# Patient Record
Sex: Female | Born: 1955 | Race: White | Hispanic: No | Marital: Single | Smoking: Never smoker
Health system: Southern US, Community
[De-identification: ages and names within clinical notes are randomized; demographics above are authoritative.]

## PROBLEM LIST (undated history)

## (undated) DIAGNOSIS — M79602 Pain in left arm: Secondary | ICD-10-CM

## (undated) DIAGNOSIS — IMO0002 Reserved for concepts with insufficient information to code with codable children: Secondary | ICD-10-CM

## (undated) DIAGNOSIS — G43009 Migraine without aura, not intractable, without status migrainosus: Secondary | ICD-10-CM

## (undated) DIAGNOSIS — R3129 Other microscopic hematuria: Secondary | ICD-10-CM

## (undated) DIAGNOSIS — E785 Hyperlipidemia, unspecified: Secondary | ICD-10-CM

## (undated) DIAGNOSIS — T4145XA Adverse effect of unspecified anesthetic, initial encounter: Secondary | ICD-10-CM

## (undated) DIAGNOSIS — R42 Dizziness and giddiness: Secondary | ICD-10-CM

## (undated) DIAGNOSIS — R05 Cough: Secondary | ICD-10-CM

## (undated) DIAGNOSIS — R5383 Other fatigue: Secondary | ICD-10-CM

## (undated) DIAGNOSIS — N390 Urinary tract infection, site not specified: Secondary | ICD-10-CM

## (undated) DIAGNOSIS — Z1231 Encounter for screening mammogram for malignant neoplasm of breast: Secondary | ICD-10-CM

## (undated) DIAGNOSIS — D649 Anemia, unspecified: Secondary | ICD-10-CM

## (undated) DIAGNOSIS — Z0000 Encounter for general adult medical examination without abnormal findings: Secondary | ICD-10-CM

## (undated) DIAGNOSIS — F329 Major depressive disorder, single episode, unspecified: Secondary | ICD-10-CM

## (undated) DIAGNOSIS — R059 Cough, unspecified: Secondary | ICD-10-CM

## (undated) DIAGNOSIS — M25562 Pain in left knee: Secondary | ICD-10-CM

## (undated) DIAGNOSIS — Z01419 Encounter for gynecological examination (general) (routine) without abnormal findings: Secondary | ICD-10-CM

## (undated) DIAGNOSIS — A6000 Herpesviral infection of urogenital system, unspecified: Secondary | ICD-10-CM

## (undated) DIAGNOSIS — M25529 Pain in unspecified elbow: Secondary | ICD-10-CM

## (undated) DIAGNOSIS — R202 Paresthesia of skin: Secondary | ICD-10-CM

## (undated) DIAGNOSIS — K589 Irritable bowel syndrome without diarrhea: Secondary | ICD-10-CM

## (undated) DIAGNOSIS — R1084 Generalized abdominal pain: Secondary | ICD-10-CM

## (undated) DIAGNOSIS — R002 Palpitations: Secondary | ICD-10-CM

## (undated) DIAGNOSIS — R079 Chest pain, unspecified: Secondary | ICD-10-CM

## (undated) DIAGNOSIS — R112 Nausea with vomiting, unspecified: Secondary | ICD-10-CM

## (undated) DIAGNOSIS — F32A Depression, unspecified: Secondary | ICD-10-CM

## (undated) DIAGNOSIS — R51 Headache: Secondary | ICD-10-CM

## (undated) DIAGNOSIS — R109 Unspecified abdominal pain: Secondary | ICD-10-CM

## (undated) DIAGNOSIS — I10 Essential (primary) hypertension: Secondary | ICD-10-CM

## (undated) DIAGNOSIS — M1990 Unspecified osteoarthritis, unspecified site: Secondary | ICD-10-CM

## (undated) DIAGNOSIS — Z9889 Other specified postprocedural states: Secondary | ICD-10-CM

## (undated) DIAGNOSIS — K648 Other hemorrhoids: Secondary | ICD-10-CM

## (undated) DIAGNOSIS — J019 Acute sinusitis, unspecified: Secondary | ICD-10-CM

## (undated) DIAGNOSIS — K56609 Unspecified intestinal obstruction, unspecified as to partial versus complete obstruction: Secondary | ICD-10-CM

## (undated) DIAGNOSIS — N3000 Acute cystitis without hematuria: Secondary | ICD-10-CM

## (undated) DIAGNOSIS — K219 Gastro-esophageal reflux disease without esophagitis: Secondary | ICD-10-CM

## (undated) DIAGNOSIS — R5381 Other malaise: Secondary | ICD-10-CM

## (undated) DIAGNOSIS — T8859XA Other complications of anesthesia, initial encounter: Secondary | ICD-10-CM

## (undated) DIAGNOSIS — R011 Cardiac murmur, unspecified: Secondary | ICD-10-CM

## (undated) HISTORY — DX: Cough, unspecified: R05.9

## (undated) HISTORY — DX: Headache: R51

## (undated) HISTORY — DX: Unspecified intestinal obstruction, unspecified as to partial versus complete obstruction: K56.609

## (undated) HISTORY — DX: Encounter for gynecological examination (general) (routine) without abnormal findings: Z01.419

## (undated) HISTORY — DX: Major depressive disorder, single episode, unspecified: F32.9

## (undated) HISTORY — DX: Reserved for concepts with insufficient information to code with codable children: IMO0002

## (undated) HISTORY — DX: Chest pain, unspecified: R07.9

## (undated) HISTORY — DX: Migraine without aura, not intractable, without status migrainosus: G43.009

## (undated) HISTORY — DX: Cough: R05

## (undated) HISTORY — DX: Encounter for general adult medical examination without abnormal findings: Z00.00

## (undated) HISTORY — DX: Irritable bowel syndrome without diarrhea: K58.9

## (undated) HISTORY — DX: Anemia, unspecified: D64.9

## (undated) HISTORY — DX: Hyperlipidemia, unspecified: E78.5

## (undated) HISTORY — DX: Acute cystitis without hematuria: N30.00

## (undated) HISTORY — DX: Gastro-esophageal reflux disease without esophagitis: K21.9

## (undated) HISTORY — DX: Other malaise: R53.83

## (undated) HISTORY — DX: Palpitations: R00.2

## (undated) HISTORY — DX: Depression, unspecified: F32.A

## (undated) HISTORY — DX: Herpesviral infection of urogenital system, unspecified: A60.00

## (undated) HISTORY — DX: Pain in left knee: M25.562

## (undated) HISTORY — DX: Pain in unspecified elbow: M25.529

## (undated) HISTORY — PX: TUBAL LIGATION: SHX77

## (undated) HISTORY — DX: Other malaise: R53.81

## (undated) HISTORY — DX: Pain in left arm: M79.602

## (undated) HISTORY — DX: Paresthesia of skin: R20.2

## (undated) HISTORY — DX: Encounter for screening mammogram for malignant neoplasm of breast: Z12.31

## (undated) HISTORY — DX: Cardiac murmur, unspecified: R01.1

## (undated) HISTORY — DX: Essential (primary) hypertension: I10

## (undated) HISTORY — DX: Dizziness and giddiness: R42

## (undated) HISTORY — DX: Other microscopic hematuria: R31.29

## (undated) HISTORY — DX: Urinary tract infection, site not specified: N39.0

## (undated) HISTORY — DX: Unspecified abdominal pain: R10.9

## (undated) HISTORY — DX: Unspecified osteoarthritis, unspecified site: M19.90

## (undated) HISTORY — DX: Acute sinusitis, unspecified: J01.90

## (undated) HISTORY — PX: ABDOMINAL HYSTERECTOMY: SHX81

## (undated) HISTORY — DX: Other hemorrhoids: K64.8

## (undated) HISTORY — DX: Generalized abdominal pain: R10.84

---

## 1996-07-31 HISTORY — PX: PARTIAL HYSTERECTOMY: SHX80

## 2004-07-31 HISTORY — PX: BILATERAL OOPHORECTOMY: SHX1221

## 2004-07-31 HISTORY — PX: OTHER SURGICAL HISTORY: SHX169

## 2005-06-09 ENCOUNTER — Encounter: Payer: Self-pay | Admitting: Family Medicine

## 2005-07-31 HISTORY — PX: OTHER SURGICAL HISTORY: SHX169

## 2005-08-23 ENCOUNTER — Encounter: Payer: Self-pay | Admitting: Family Medicine

## 2005-11-30 ENCOUNTER — Encounter: Payer: Self-pay | Admitting: Family Medicine

## 2005-12-07 ENCOUNTER — Encounter: Payer: Self-pay | Admitting: Family Medicine

## 2006-08-08 ENCOUNTER — Encounter: Payer: Self-pay | Admitting: Family Medicine

## 2006-09-20 ENCOUNTER — Encounter: Payer: Self-pay | Admitting: Family Medicine

## 2007-05-16 ENCOUNTER — Encounter: Payer: Self-pay | Admitting: Family Medicine

## 2007-07-17 ENCOUNTER — Encounter: Payer: Self-pay | Admitting: Family Medicine

## 2007-08-02 ENCOUNTER — Encounter: Payer: Self-pay | Admitting: Family Medicine

## 2008-02-12 ENCOUNTER — Ambulatory Visit: Payer: Self-pay | Admitting: Family Medicine

## 2008-02-12 DIAGNOSIS — F329 Major depressive disorder, single episode, unspecified: Secondary | ICD-10-CM

## 2008-02-12 DIAGNOSIS — D509 Iron deficiency anemia, unspecified: Secondary | ICD-10-CM

## 2008-02-12 DIAGNOSIS — R011 Cardiac murmur, unspecified: Secondary | ICD-10-CM | POA: Insufficient documentation

## 2008-02-12 DIAGNOSIS — Z8679 Personal history of other diseases of the circulatory system: Secondary | ICD-10-CM | POA: Insufficient documentation

## 2008-02-12 DIAGNOSIS — K219 Gastro-esophageal reflux disease without esophagitis: Secondary | ICD-10-CM

## 2008-02-12 DIAGNOSIS — I10 Essential (primary) hypertension: Secondary | ICD-10-CM | POA: Insufficient documentation

## 2008-02-12 DIAGNOSIS — G43009 Migraine without aura, not intractable, without status migrainosus: Secondary | ICD-10-CM | POA: Insufficient documentation

## 2008-02-12 DIAGNOSIS — M1990 Unspecified osteoarthritis, unspecified site: Secondary | ICD-10-CM

## 2008-02-12 DIAGNOSIS — R109 Unspecified abdominal pain: Secondary | ICD-10-CM

## 2008-02-12 DIAGNOSIS — A6000 Herpesviral infection of urogenital system, unspecified: Secondary | ICD-10-CM | POA: Insufficient documentation

## 2008-02-12 DIAGNOSIS — E785 Hyperlipidemia, unspecified: Secondary | ICD-10-CM | POA: Insufficient documentation

## 2008-02-12 DIAGNOSIS — F331 Major depressive disorder, recurrent, moderate: Secondary | ICD-10-CM | POA: Insufficient documentation

## 2008-02-12 HISTORY — DX: Unspecified abdominal pain: R10.9

## 2008-02-12 HISTORY — DX: Cardiac murmur, unspecified: R01.1

## 2008-02-12 HISTORY — DX: Herpesviral infection of urogenital system, unspecified: A60.00

## 2008-02-12 HISTORY — DX: Essential (primary) hypertension: I10

## 2008-02-12 HISTORY — DX: Iron deficiency anemia, unspecified: D50.9

## 2008-02-12 HISTORY — DX: Gastro-esophageal reflux disease without esophagitis: K21.9

## 2008-02-12 HISTORY — DX: Major depressive disorder, single episode, unspecified: F32.9

## 2008-02-12 HISTORY — DX: Unspecified osteoarthritis, unspecified site: M19.90

## 2008-02-27 ENCOUNTER — Telehealth: Payer: Self-pay | Admitting: Family Medicine

## 2008-03-02 ENCOUNTER — Telehealth: Payer: Self-pay | Admitting: Family Medicine

## 2008-04-13 ENCOUNTER — Ambulatory Visit: Payer: Self-pay | Admitting: Family Medicine

## 2008-05-14 ENCOUNTER — Ambulatory Visit: Payer: Self-pay | Admitting: Family Medicine

## 2008-05-14 DIAGNOSIS — N390 Urinary tract infection, site not specified: Secondary | ICD-10-CM | POA: Insufficient documentation

## 2008-05-14 LAB — CONVERTED CEMR LAB
Specific Gravity, Urine: 1.015
WBC Urine, dipstick: NEGATIVE
pH: 5

## 2008-05-15 ENCOUNTER — Encounter: Payer: Self-pay | Admitting: Family Medicine

## 2008-06-05 ENCOUNTER — Ambulatory Visit: Payer: Self-pay | Admitting: Family Medicine

## 2008-06-10 LAB — CONVERTED CEMR LAB
Alkaline Phosphatase: 61 units/L (ref 39–117)
BUN: 10 mg/dL (ref 6–23)
Bilirubin, Direct: 0.1 mg/dL (ref 0.0–0.3)
Calcium: 8.9 mg/dL (ref 8.4–10.5)
Chloride: 105 meq/L (ref 96–112)
Creatinine, Ser: 0.8 mg/dL (ref 0.4–1.2)
Direct LDL: 162.4 mg/dL
GFR calc non Af Amer: 80 mL/min
HDL: 53.9 mg/dL (ref >39.0)
Potassium: 3.9 meq/L (ref 3.5–5.1)
Sodium: 141 meq/L (ref 135–145)
Total Protein: 6.6 g/dL (ref 6.0–8.3)
VLDL: 38 mg/dL (ref 0–40)

## 2008-06-12 ENCOUNTER — Ambulatory Visit: Payer: Self-pay | Admitting: Family Medicine

## 2008-06-12 LAB — CONVERTED CEMR LAB: LDL Goal: 160 mg/dL

## 2008-06-16 ENCOUNTER — Encounter: Admission: RE | Admit: 2008-06-16 | Discharge: 2008-06-16 | Payer: Self-pay | Admitting: Family Medicine

## 2008-06-16 LAB — HM MAMMOGRAPHY: HM Mammogram: NEGATIVE

## 2008-06-17 ENCOUNTER — Encounter (INDEPENDENT_AMBULATORY_CARE_PROVIDER_SITE_OTHER): Payer: Self-pay | Admitting: *Deleted

## 2008-08-25 ENCOUNTER — Telehealth: Payer: Self-pay | Admitting: Family Medicine

## 2008-09-14 ENCOUNTER — Ambulatory Visit: Payer: Self-pay | Admitting: Family Medicine

## 2008-09-21 LAB — CONVERTED CEMR LAB
ALT: 46 units/L — ABNORMAL HIGH (ref 0–35)
AST: 33 units/L (ref 0–37)
HDL: 51.9 mg/dL (ref >39.0)
Total CHOL/HDL Ratio: 4.6
VLDL: 32 mg/dL (ref 0–40)

## 2008-09-28 ENCOUNTER — Ambulatory Visit: Payer: Self-pay | Admitting: Family Medicine

## 2008-10-02 ENCOUNTER — Ambulatory Visit: Payer: Self-pay | Admitting: Family Medicine

## 2008-10-02 DIAGNOSIS — R05 Cough: Secondary | ICD-10-CM

## 2008-10-02 DIAGNOSIS — R053 Chronic cough: Secondary | ICD-10-CM

## 2008-10-02 HISTORY — DX: Chronic cough: R05.3

## 2008-10-09 ENCOUNTER — Telehealth (INDEPENDENT_AMBULATORY_CARE_PROVIDER_SITE_OTHER): Payer: Self-pay | Admitting: *Deleted

## 2009-01-12 ENCOUNTER — Ambulatory Visit: Payer: Self-pay | Admitting: Family Medicine

## 2009-01-12 DIAGNOSIS — R5381 Other malaise: Secondary | ICD-10-CM | POA: Insufficient documentation

## 2009-01-12 DIAGNOSIS — R5383 Other fatigue: Secondary | ICD-10-CM

## 2009-01-12 HISTORY — DX: Other fatigue: R53.83

## 2009-01-12 LAB — CONVERTED CEMR LAB
Bacteria, UA: 0
Bilirubin Urine: NEGATIVE
Casts: 0 /lpf
Nitrite: NEGATIVE
Protein, U semiquant: NEGATIVE
Specific Gravity, Urine: 1.015
Urobilinogen, UA: 0.2
WBC Urine, dipstick: NEGATIVE
pH: 6

## 2009-01-13 ENCOUNTER — Ambulatory Visit: Payer: Self-pay | Admitting: Family Medicine

## 2009-01-14 ENCOUNTER — Encounter: Admission: RE | Admit: 2009-01-14 | Discharge: 2009-01-14 | Payer: Self-pay | Admitting: Family Medicine

## 2009-01-15 LAB — CONVERTED CEMR LAB
AST: 29 units/L (ref 0–37)
Albumin: 4 g/dL (ref 3.5–5.2)
Alkaline Phosphatase: 62 units/L (ref 39–117)
BUN: 13 mg/dL (ref 6–23)
Basophils Absolute: 0 10*3/uL (ref 0.0–0.1)
CO2: 29 meq/L (ref 19–32)
Calcium: 8.8 mg/dL (ref 8.4–10.5)
Cholesterol: 159 mg/dL (ref 0–200)
Eosinophils Relative: 1.7 % (ref 0.0–5.0)
Ferritin: 3.5 ng/mL — ABNORMAL LOW (ref 10.0–291.0)
GFR calc non Af Amer: 79.72 mL/min (ref >60)
Glucose, Bld: 92 mg/dL (ref 70–99)
HCT: 35.5 % — ABNORMAL LOW (ref 36.0–46.0)
HDL: 66.2 mg/dL (ref >39.00)
Hemoglobin: 11.7 g/dL — ABNORMAL LOW (ref 12.0–15.0)
Lipase: 19 units/L (ref 11.0–59.0)
Lymphocytes Relative: 26.9 % (ref 12.0–46.0)
Lymphs Abs: 1.7 10*3/uL (ref 0.7–4.0)
Monocytes Relative: 7.4 % (ref 3.0–12.0)
Platelets: 194 10*3/uL (ref 150.0–400.0)
RDW: 14.5 % (ref 11.5–14.6)
TSH: 1.35 microintl units/mL (ref 0.35–5.50)
Total Protein: 6.6 g/dL (ref 6.0–8.3)
Triglycerides: 104 mg/dL (ref 0.0–149.0)
VLDL: 20.8 mg/dL (ref 0.0–40.0)
Vit D, 25-Hydroxy: 23 ng/mL — ABNORMAL LOW (ref 30–89)
WBC: 6.4 10*3/uL (ref 4.5–10.5)

## 2009-02-08 ENCOUNTER — Telehealth: Payer: Self-pay | Admitting: Family Medicine

## 2009-02-10 ENCOUNTER — Ambulatory Visit: Payer: Self-pay | Admitting: Gastroenterology

## 2009-02-10 DIAGNOSIS — K589 Irritable bowel syndrome without diarrhea: Secondary | ICD-10-CM | POA: Insufficient documentation

## 2009-02-10 DIAGNOSIS — K648 Other hemorrhoids: Secondary | ICD-10-CM

## 2009-02-10 HISTORY — DX: Other hemorrhoids: K64.8

## 2009-02-10 HISTORY — DX: Irritable bowel syndrome, unspecified: K58.9

## 2009-02-25 ENCOUNTER — Ambulatory Visit: Payer: Self-pay | Admitting: Gastroenterology

## 2009-02-25 LAB — HM COLONOSCOPY

## 2009-03-02 ENCOUNTER — Ambulatory Visit: Payer: Self-pay | Admitting: Family Medicine

## 2009-03-30 ENCOUNTER — Telehealth: Payer: Self-pay | Admitting: Family Medicine

## 2009-05-06 ENCOUNTER — Ambulatory Visit: Payer: Self-pay | Admitting: Family Medicine

## 2009-09-07 ENCOUNTER — Ambulatory Visit: Payer: Self-pay | Admitting: Family Medicine

## 2009-09-07 DIAGNOSIS — R51 Headache: Secondary | ICD-10-CM | POA: Insufficient documentation

## 2009-09-07 DIAGNOSIS — R42 Dizziness and giddiness: Secondary | ICD-10-CM | POA: Insufficient documentation

## 2009-09-07 DIAGNOSIS — R519 Headache, unspecified: Secondary | ICD-10-CM | POA: Insufficient documentation

## 2009-09-13 ENCOUNTER — Telehealth: Payer: Self-pay | Admitting: Family Medicine

## 2009-09-21 ENCOUNTER — Telehealth: Payer: Self-pay | Admitting: Family Medicine

## 2009-09-22 ENCOUNTER — Ambulatory Visit: Payer: Self-pay | Admitting: Family Medicine

## 2009-09-24 LAB — CONVERTED CEMR LAB
BUN: 15 mg/dL (ref 6–23)
Calcium: 8.6 mg/dL (ref 8.4–10.5)
Creatinine, Ser: 0.8 mg/dL (ref 0.4–1.2)
GFR calc non Af Amer: 79.51 mL/min (ref >60)
Glucose, Bld: 119 mg/dL — ABNORMAL HIGH (ref 70–99)

## 2009-09-27 ENCOUNTER — Telehealth: Payer: Self-pay | Admitting: Family Medicine

## 2009-09-28 ENCOUNTER — Ambulatory Visit: Payer: Self-pay | Admitting: Family Medicine

## 2009-09-28 DIAGNOSIS — R002 Palpitations: Secondary | ICD-10-CM

## 2009-09-28 DIAGNOSIS — R209 Unspecified disturbances of skin sensation: Secondary | ICD-10-CM

## 2009-09-28 DIAGNOSIS — M79609 Pain in unspecified limb: Secondary | ICD-10-CM | POA: Insufficient documentation

## 2009-09-28 HISTORY — DX: Palpitations: R00.2

## 2009-09-28 HISTORY — DX: Unspecified disturbances of skin sensation: R20.9

## 2009-09-28 HISTORY — PX: CARDIOVASCULAR STRESS TEST: SHX262

## 2009-10-04 ENCOUNTER — Telehealth: Payer: Self-pay | Admitting: Family Medicine

## 2009-10-06 ENCOUNTER — Telehealth: Payer: Self-pay | Admitting: Family Medicine

## 2009-10-12 ENCOUNTER — Encounter: Payer: Self-pay | Admitting: Family Medicine

## 2009-10-26 ENCOUNTER — Ambulatory Visit: Payer: Self-pay | Admitting: Family Medicine

## 2009-10-26 DIAGNOSIS — R1084 Generalized abdominal pain: Secondary | ICD-10-CM | POA: Insufficient documentation

## 2009-10-26 DIAGNOSIS — R079 Chest pain, unspecified: Secondary | ICD-10-CM | POA: Insufficient documentation

## 2009-11-03 ENCOUNTER — Telehealth (INDEPENDENT_AMBULATORY_CARE_PROVIDER_SITE_OTHER): Payer: Self-pay | Admitting: *Deleted

## 2009-11-04 ENCOUNTER — Ambulatory Visit: Payer: Self-pay

## 2009-11-04 ENCOUNTER — Encounter (HOSPITAL_COMMUNITY): Admission: RE | Admit: 2009-11-04 | Discharge: 2009-12-29 | Payer: Self-pay | Admitting: Family Medicine

## 2009-11-04 ENCOUNTER — Ambulatory Visit: Payer: Self-pay | Admitting: Cardiology

## 2009-11-05 ENCOUNTER — Ambulatory Visit: Payer: Self-pay | Admitting: Internal Medicine

## 2009-11-05 DIAGNOSIS — J019 Acute sinusitis, unspecified: Secondary | ICD-10-CM | POA: Insufficient documentation

## 2009-11-24 ENCOUNTER — Ambulatory Visit: Payer: Self-pay | Admitting: Family Medicine

## 2010-04-15 ENCOUNTER — Ambulatory Visit: Payer: Self-pay | Admitting: Family Medicine

## 2010-04-15 DIAGNOSIS — R3129 Other microscopic hematuria: Secondary | ICD-10-CM

## 2010-04-15 DIAGNOSIS — N3000 Acute cystitis without hematuria: Secondary | ICD-10-CM | POA: Insufficient documentation

## 2010-04-15 HISTORY — DX: Other microscopic hematuria: R31.29

## 2010-04-15 LAB — CONVERTED CEMR LAB
Glucose, Urine, Semiquant: NEGATIVE
Nitrite: POSITIVE
Specific Gravity, Urine: 1.025
pH: 6.5

## 2010-04-19 ENCOUNTER — Ambulatory Visit: Payer: Self-pay | Admitting: Family Medicine

## 2010-04-19 DIAGNOSIS — M25569 Pain in unspecified knee: Secondary | ICD-10-CM | POA: Insufficient documentation

## 2010-04-19 DIAGNOSIS — M25529 Pain in unspecified elbow: Secondary | ICD-10-CM | POA: Insufficient documentation

## 2010-04-19 DIAGNOSIS — IMO0002 Reserved for concepts with insufficient information to code with codable children: Secondary | ICD-10-CM | POA: Insufficient documentation

## 2010-04-21 ENCOUNTER — Ambulatory Visit: Payer: Self-pay | Admitting: Cardiology

## 2010-04-28 ENCOUNTER — Encounter: Payer: Self-pay | Admitting: Family Medicine

## 2010-04-29 ENCOUNTER — Ambulatory Visit: Payer: Self-pay | Admitting: Family Medicine

## 2010-04-29 LAB — CONVERTED CEMR LAB
Ketones, urine, test strip: NEGATIVE
Nitrite: NEGATIVE
RBC / HPF: 0
Specific Gravity, Urine: >=1.03
Urobilinogen, UA: 0.2

## 2010-07-31 DIAGNOSIS — R4189 Other symptoms and signs involving cognitive functions and awareness: Secondary | ICD-10-CM | POA: Insufficient documentation

## 2010-07-31 HISTORY — DX: Other symptoms and signs involving cognitive functions and awareness: R41.89

## 2010-08-21 ENCOUNTER — Encounter: Payer: Self-pay | Admitting: Family Medicine

## 2010-08-30 NOTE — Assessment & Plan Note (Signed)
Summary: URINALYSIS/DLO  Nurse Visit   Allergies: 1)  ! Codeine Laboratory Results   Urine Tests  Date/Time Received: April 29, 2010 4:40 PM   Routine Urinalysis   Color: yellow Appearance: Clear Glucose: negative   (Normal Range: Negative) Bilirubin: negative   (Normal Range: Negative) Ketone: negative   (Normal Range: Negative) Spec. Gravity: >=1.030   (Normal Range: 1.003-1.035) Blood: large   (Normal Range: Negative) pH: 5.0   (Normal Range: 5.0-8.0) Protein: trace   (Normal Range: Negative) Urobilinogen: 0.2   (Normal Range: 0-1) Nitrite: negative   (Normal Range: Negative) Leukocyte Esterace: small   (Normal Range: Negative)  Urine Microscopic WBC/HPF: 5-6  RBC/HPF: 0 Epithelial/HPF: none    Comments: [facility] uses [name].  Please call her at [phone].  Will send in for culture... wbc present ...no blood seen on microscopic, but some seen on dip. Kerby Nora MD  April 29, 2010 5:01 PM     Orders Added: 1)  UA Dipstick W/ Micro (manual) [81000] 2)  T-Culture, Urine [[id number]]  Appended Document: URINALYSIS/DLO   Appended Document: URINALYSIS/DLO Patient advised via message on patient personal cell phone.Consuello Masse CMA

## 2010-08-30 NOTE — Assessment & Plan Note (Signed)
Summary: ? UTI   Vital Signs:  Patient profile:   55 year old female Height:      68 inches Weight:      237.0 pounds BMI:     36.17 Temp:     98.6 degrees F oral Pulse rate:   84 / minute Pulse rhythm:   regular BP sitting:   116 / 78  (left arm) Cuff size:   large  Vitals Entered By: Benny Lennert CMA Duncan Dull) (April 15, 2010 10:34 AM)  History of Present Illness: Chief complaint ? Uti  Acute Visit History:      The patient complains of genitourinary symptoms.  These symptoms began one day ago.  She notes a history of dysuria, frequency, and urgency.  She denies abdominal cramping, abdominal pain, fever, flank pain, hematuria, or nausea.  Comments: odor in urine.        Problems Prior to Update: 1)  Abdominal Pain, Generalized  (ICD-789.07) 2)  Sinusitis- Acute-nos  (ICD-461.9) 3)  Chest Pain, Acute  (ICD-786.50) 4)  Paresthesia  (ICD-782.0) 5)  Arm Pain, Left  (ICD-729.5) 6)  Palpitations, Chronic  (ICD-785.1) 7)  Headache  (ICD-784.0) 8)  Dizziness  (ICD-780.4) 9)  Internal Hemorrhoids Without Mention Comp  (ICD-455.0) 10)  Ibs  (ICD-564.1) 11)  Fatigue, Chronic  (ICD-780.79) 12)  Cough  (ICD-786.2) 13)  Routine Gynecological Examination  (ICD-V72.31) 14)  Well Woman  (ICD-V70.0) 15)  Other Screening Mammogram  (ICD-V76.12) 16)  Uti  (ICD-599.0) 17)  Abdominal Pain, Chronic  (ICD-789.00) 18)  Common Migraine  (ICD-346.10) 19)  Heart Murmur, Hx of  (ICD-V12.50) 20)  Genital Herpes  (ICD-054.10) 21)  Hypertension  (ICD-401.9) 22)  Hyperlipidemia  (ICD-272.4) 23)  Gerd  (ICD-530.81) 24)  Depression  (ICD-311) 25)  Osteoarthritis  (ICD-715.90) 26)  Anemia-iron Deficiency  (ICD-280.9)  Current Medications (verified): 1)  Valtrex 500 Mg  Tabs (Valacyclovir Hcl) .... Take 1 Tablet By Mouth Once A Day 2)  Calcium Citrate-Vitamin D 315-200 Mg-Unit  Tabs (Calcium Citrate-Vitamin D) .... Take 1 Tablet By Mouth Once A Day 3)  Vitamin B Complex-C   Caps (B Complex-C)  .... Take 1 Capsule By Mouth Once A Day 4)  Simvastatin 40 Mg Tabs (Simvastatin) .Marland Kitchen.. 1 Tab By Mouth Daily 5)  Hydrochlorothiazide 25 Mg Tabs (Hydrochlorothiazide) .Marland Kitchen.. 1 Tab By Mouth Daily 6)  Omeprazole 40 Mg Cpdr (Omeprazole) .... Take 1 Tablet By Mouth Once A Day 7)  Fluticasone Propionate 50 Mcg/act Susp (Fluticasone Propionate) .... 2 Sprays Per Nostril Daily 8)  Ferrous Sulfate 325 (65 Fe) Mg Tabs (Ferrous Sulfate) .... Once Tablet Daily 9)  Glucosamine-Chondroitin 1500-1200 Mg/65ml Liqd (Glucosamine-Chondroitin) .... As Directed On Box  Allergies: 1)  ! Codeine  Past History:  Past medical, surgical, family and social histories (including risk factors) reviewed, and no changes noted (except as noted below).  Past Medical History: Reviewed history from 11/05/2009 and no changes required. Anemia-iron deficiency Osteoarthritis Depression GERD Hyperlipidemia Hypertension Small Bowel Obstruction Urinary Tract Infection Stress test 09/2009 low risk  Past Surgical History: Reviewed history from 02/12/2008 and no changes required. hysterectomy, partial, vaginal 1998 2006 bilateral oopherectomy  for large cyst Barium swallow 2006: hiatal hernia 2007 barium follow thru 2007 because rectum twisted and could't do colonoscopy, was painful though  Family History: Reviewed history from 02/10/2009 and no changes required. father: MI late 53s, CABG, lung cancer 8 uncles with MI mother: breast cancer Family History of Colon Polyps: Sister Family History of Liver Disease/Cirrhosis: Youth worker  Social History: Reviewed history from 02/10/2009 and no changes required. Occupation: Engineering geologist, Editor, commissioning [employer] Divorced 3 children healthy Never Smoked Alcohol use-yes Drug use-yes, remote but no injected Regular exercise-no Diet: fruits, veggies, water Daily Caffeine Use 1 cup  Review of Systems General:  Complains of fatigue; denies fever. CV:  Denies chest  pain or discomfort. Resp:  Denies shortness of breath.  Physical Exam  General:  alert and overweight-appearing, more interactive, more responsive affect than in past few appts.  Mouth:  MMM Lungs:  Normal respiratory effort, chest expands symmetrically. Lungs are clear to auscultation, no crackles or wheezes. Heart:  Normal rate and regular rhythm. S1 and S2 normal without gallop, murmur, click, rub or other extra sounds. Abdomen:  Bowel sounds positive,abdomen soft and non-tender without masses, organomegaly or hernias noted. No CVA tenderness   Impression & Recommendations:  Problem # 1:  CYSTITIS, ACUTE (ICD-595.0)  Orders: UA Dipstick w/o Micro (manual) (16109)  Her updated medication list for this problem includes:    Ciprofloxacin Hcl 500 Mg Tabs (Ciprofloxacin hcl) .Marland Kitchen... 1 tab by mouth two times a day x 3  Encouraged to push clear liquids, get enough rest, and take acetaminophen as needed. Call if no improvement, sooner if worse.  Problem # 2:  MICROSCOPIC HEMATURIA (ICD-599.72) Return for UA in 2weeks to assure resolution.  Her updated medication list for this problem includes:    Ciprofloxacin Hcl 500 Mg Tabs (Ciprofloxacin hcl) .Marland Kitchen... 1 tab by mouth two times a day x 3  Complete Medication List: 1)  Valtrex 500 Mg Tabs (Valacyclovir hcl) .... Take 1 tablet by mouth once a day 2)  Calcium Citrate-vitamin D 315-200 Mg-unit Tabs (Calcium citrate-vitamin d) .... Take 1 tablet by mouth once a day 3)  Vitamin B Complex-c Caps (B complex-c) .... Take 1 capsule by mouth once a day 4)  Simvastatin 40 Mg Tabs (Simvastatin) .Marland Kitchen.. 1 tab by mouth daily 5)  Hydrochlorothiazide 25 Mg Tabs (Hydrochlorothiazide) .Marland Kitchen.. 1 tab by mouth daily 6)  Omeprazole 40 Mg Cpdr (Omeprazole) .... Take 1 tablet by mouth once a day 7)  Fluticasone Propionate 50 Mcg/act Susp (Fluticasone propionate) .... 2 sprays per nostril daily 8)  Ferrous Sulfate 325 (65 Fe) Mg Tabs (Ferrous sulfate) .... Once tablet  daily 9)  Glucosamine-chondroitin 1500-1200 Mg/23ml Liqd (Glucosamine-chondroitin) .... As directed on box 10)  Ciprofloxacin Hcl 500 Mg Tabs (Ciprofloxacin hcl) .Marland Kitchen.. 1 tab by mouth two times a day x 3  Patient Instructions: 1)  Return for RN visit in 2 weeks for Urinalysis to eval hematuria. 2)  Start antibiotics. 3)   Call if symptoms not improving.  Prescriptions: CIPROFLOXACIN HCL 500 MG TABS (CIPROFLOXACIN HCL) 1 tab by mouth two times a day x 3  #6 x 0   Entered and Authorized by:   Kerby Nora MD   Signed by:   Kerby Nora MD on 04/15/2010   Method used:   [facility] to        [name] [id number]retail[facility]       [address]       [place], [place]  [address]       Ph: [phone] or [phone]       Fax: [phone]   RxID:   [id number]   Current Allergies (reviewed today): ! CODEINE  Laboratory Results   Urine Tests  Date/Time Received: April 15, 2010 10:44 AM  Date/Time Reported: April 15, 2010 10:44 AM   Routine Urinalysis  Color: yellow Appearance: Hazy Glucose: negative   (Normal Range: Negative) Bilirubin: negative   (Normal Range: Negative) Ketone: negative   (Normal Range: Negative) Spec. Gravity: 1.025   (Normal Range: 1.003-1.035) Blood: large   (Normal Range: Negative) pH: 6.5   (Normal Range: 5.0-8.0) Protein: negative   (Normal Range: Negative) Urobilinogen: 0.2   (Normal Range: 0-1) Nitrite: positive   (Normal Range: Negative) Leukocyte Esterace: moderate   (Normal Range: Negative)       Last Flu Vaccine:  Fluvax 3+ (05/06/2009 11:39:05 AM) Flu Vaccine Next Due:  Refused

## 2010-08-30 NOTE — Progress Notes (Signed)
  Phone Note From Other Clinic   Caller: Referral Coordinator Summary of Call: [facility] called to say that the order to do just a nerve conduction cannott be done because this patients insurance [employer] will only pay to do a Nerve Conduction with EMG. Patient has been scheduled for 10/12/2009 at 9:00am for both studies. Initial call taken by: Carlton Adam,  October 06, 2009 8:43 AM

## 2010-08-30 NOTE — Letter (Signed)
Summary: [facility]   Imported By: Maryln Gottron 05/18/2010 15:57:36  _____________________________________________________________________  External Attachment:    Type:   Image     Comment:   External Document

## 2010-08-30 NOTE — Assessment & Plan Note (Signed)
Summary: ST DIZZY BLURRED VISION/MK   Vital Signs:  Patient profile:   55 year old female Height:      68.5 inches Weight:      239.13 pounds BMI:     35.96 Temp:     98.1 degrees F oral Pulse rate:   84 / minute Pulse rhythm:   regular BP sitting:   152 / 100  (left arm) Cuff size:   large  Vitals Entered By: Delilah Shan CMA Duncan Dull) (September 07, 2009 4:04 PM)   History of Present Illness: BP elevated.  Patient has been taking [employer] brand of Severe Cough and Congestion two times a day .  Lugene Fuquay CMA (AAMA)  September 07, 2009 4:06 PM    Weight gain in last few months. 10 lb weight gain..not sure why. Limits salt in diet. Has not been checking at home.  Dizzy spells intermittant in last few months. Headhaches, posterior head.   Using OTC decongestant.    Acute Visit History:      The patient complains of cough, earache, fever, nasal discharge, and sore throat.  These symptoms began 1 week ago.  Other comments include: body aches worsening symtoms. Marland Kitchen        Her highest temperature has been 101.        The patient notes wheezing.  The character of the cough is described as productive, green.        The earache is located on the right side.         Hypertension History:      BP had been diet and lifestyule controlled until recently..in past had been on atenolol, but not im last few years. .        Positive major cardiovascular risk factors include hyperlipidemia and hypertension.  Negative major cardiovascular risk factors include female age less than 35 years old and non-tobacco-user status.         Problems Prior to Update: 1)  Internal Hemorrhoids Without Mention Comp  (ICD-455.0) 2)  Ibs  (ICD-564.1) 3)  Fatigue, Chronic  (ICD-780.79) 4)  Cough  (ICD-786.2) 5)  Routine Gynecological Examination  (ICD-V72.31) 6)  Well Woman  (ICD-V70.0) 7)  Other Screening Mammogram  (ICD-V76.12) 8)  Uti  (ICD-599.0) 9)  Abdominal Pain, Chronic  (ICD-789.00) 10)   Common Migraine  (ICD-346.10) 11)  Heart Murmur, Hx of  (ICD-V12.50) 12)  Genital Herpes  (ICD-054.10) 13)  Hypertension  (ICD-401.9) 14)  Hyperlipidemia  (ICD-272.4) 15)  Gerd  (ICD-530.81) 16)  Depression  (ICD-311) 17)  Osteoarthritis  (ICD-715.90) 18)  Anemia-iron Deficiency  (ICD-280.9)  Current Medications (verified): 1)  Omeprazole 20 Mg  Cpdr (Omeprazole) .... Take 1 Tablet By Mouth Once A Day 2)  Valtrex 500 Mg  Tabs (Valacyclovir Hcl) .... Take 1 Tablet By Mouth Once A Day 3)  Calcium Citrate-Vitamin D 315-200 Mg-Unit  Tabs (Calcium Citrate-Vitamin D) .... Take 1 Tablet By Mouth Once A Day 4)  Vitamin B Complex-C   Caps (B Complex-C) .... Take 1 Capsule By Mouth Once A Day 5)  Simvastatin 40 Mg Tabs (Simvastatin) .Marland Kitchen.. 1 Tab By Mouth Daily 6)  Proair Hfa 108 (90 Base) Mcg/act Aers (Albuterol Sulfate) .... 2 Spray Inh Every 4-6 Hours As Needed Wheeze, Coughing Fits 7)  Vitamin D (Ergocalciferol) 50000 Unit Caps (Ergocalciferol) .Marland Kitchen.. 1 Tab By Mouth Weekly X 6 Weeks 8)  Potassium Gluconate 595 Mg Tabs (Potassium Gluconate) .... Take 1 Tablet By Mouth Once A Day 9)  Azithromycin 250 Mg Tabs (Azithromycin) .... 2 Tab By Mouth X 1 Then 1 Tab Po Daily  Allergies: 1)  ! Codeine  Past History:  Past medical, surgical, family and social histories (including risk factors) reviewed, and no changes noted (except as noted below).  Past Medical History: Reviewed history from 02/10/2009 and no changes required. Anemia-iron deficiency Osteoarthritis Depression GERD Hyperlipidemia Hypertension Small Bowel Obstruction Urinary Tract Infection  Past Surgical History: Reviewed history from 02/12/2008 and no changes required. hysterectomy, partial, vaginal 1998 2006 bilateral oopherectomy  for large cyst Barium swallow 2006: hiatal hernia 2007 barium follow thru 2007 because rectum twisted and could't do colonoscopy, was painful though  Family History: Reviewed history from  02/10/2009 and no changes required. father: MI late 38s, CABG, lung cancer 8 uncles with MI mother: breast cancer Family History of Colon Polyps: Sister Family History of Liver Disease/Cirrhosis: Mat Aunt  Social History: Reviewed history from 02/10/2009 and no changes required. 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 CV:  Denies chest pain or discomfort. Resp:  Denies shortness of breath. GI:  Denies abdominal pain. GU:  Denies dysuria.  Physical Exam  General:  obese appearing  IN NAD Head:  no maxilalry sinus ttp Eyes:  No corneal or conjunctival inflammation noted. EOMI. Perrla. Funduscopic exam benign, without hemorrhages, exudates or papilledema. Vision grossly normal. Ears:  External ear exam shows no significant lesions or deformities.  Otoscopic examination reveals clear canals, tympanic membranes are intact bilaterally without bulging, retraction, inflammation or discharge. Hearing is grossly normal bilaterally. Nose:  External nasal examination shows no deformity or inflammation. Nasal mucosa are pink and moist without lesions or exudates. Mouth:  MMM Neck:  no carotid bruit or thyromegaly no cervical or supraclavicular lymphadenopathy  Lungs:  Normal respiratory effort, chest expands symmetrically. Lungs are clear to auscultation, no crackles or wheezes. Heart:  Normal rate and regular rhythm. S1 and S2 normal without gallop, murmur, click, rub or other extra sounds. Pulses:  R and L posterior tibial pulses are full and equal bilaterally  Extremities:  no edema Neurologic:  No cranial nerve deficits noted. Station and gait are normal. Plantar reflexes are down-going bilaterally. DTRs are symmetrical throughout. Sensory, motor  and coordinative functions appear intact.   Impression & Recommendations:  Problem # 1:  DIZZINESS (ICD-780.4) BPs elevated..follow BP at home. Stop decongestant. If BPs remain elevated will need to treat.  ? due to nasal congestion and eustacian tube dysfunction...try mucinex two times a day.   Problem # 2:  HEADACHE (ICD-784.0) Nml neuro exam... ? due to HTN as above.  Problem # 3:  HYPERTENSION (ICD-401.9) ? inadequate control.  Encouraged exercise, weight loss, healthy eating habits.   BP today: 152/100 Prior BP: 128/82 (02/10/2009)  Prior 10 Yr Risk Heart Disease: 6 % (06/12/2008)  Labs Reviewed: K+: 4.0 (01/13/2009) Creat: : 0.8 (01/13/2009)   Chol: 159 (01/13/2009)   HDL: 66.20 (01/13/2009)   LDL: 72 (01/13/2009)   TG: 104.0 (01/13/2009)  Problem # 4:  FATIGUE, CHRONIC (ICD-780.79) Minimal improvement.   Complete Medication List: 1)  Omeprazole 20 Mg Cpdr (Omeprazole) .... Take 1 tablet by mouth once a day 2)  Valtrex 500 Mg Tabs (Valacyclovir hcl) .... Take 1 tablet by mouth once a day 3)  Calcium Citrate-vitamin D 315-200 Mg-unit  Tabs (Calcium citrate-vitamin d) .... Take 1 tablet by mouth once a day 4)  Vitamin B Complex-c Caps (B complex-c) .... Take 1 capsule by mouth once a day 5)  Simvastatin 40 Mg Tabs (Simvastatin) .Marland Kitchen.. 1 tab by mouth daily 6)  Proair Hfa 108 (90 Base) Mcg/act Aers (Albuterol sulfate) .... 2 spray inh every 4-6 hours as needed wheeze, coughing fits 7)  Vitamin D (ergocalciferol) 50000 Unit Caps (Ergocalciferol) .Marland Kitchen.. 1 tab by mouth weekly x 6 weeks 8)  Potassium Gluconate 595 Mg Tabs (Potassium gluconate) .... Take 1 tablet by mouth once a day 9)  Azithromycin 250 Mg Tabs (Azithromycin) .... 2 tab by mouth x 1 then 1 tab po daily  Hypertension Assessment/Plan:      The patient's hypertensive risk group is category B: At least one risk factor (excluding diabetes) with no target organ damage.  Her calculated 10 year risk of coronary heart disease  is 6 %.  Today's blood pressure is 152/100.  Her blood pressure goal is < 140/90.  Patient Instructions: 1)  Stop any decongestant.  2)  Mucinex DM during the day. 3)  Nasal saline 3 times daily. 4)  Follow up in 2 weeks 30 min OV. HTN and multiple issue eval. 5)  Follow BP at home in next few days..call if >140/90 off decongestant. Call on friday with measurements.  Prescriptions: AZITHROMYCIN 250 MG TABS (AZITHROMYCIN) 2 tab by mouth x 1 then 1 tab po daily  #6 x 0   Entered and Authorized by:   Kerby Nora MD   Signed by:   Kerby Nora MD on 09/07/2009   Method used:   [facility] to        [name] (retail)       [address]       [place], [place]  [address]       Ph: [phone] or [phone]       Fax: [phone]   RxID:   [id number]   Current Allergies (reviewed today): ! CODEINE

## 2010-08-30 NOTE — Assessment & Plan Note (Signed)
Summary: ROA 1 MTH-30 MIN..CYD   Vital Signs:  Patient profile:   55 year old female Height:      68 inches Weight:      234.2 pounds BMI:     35.74 Temp:     98.3 degrees F oral Pulse rate:   90 / minute Pulse rhythm:   regular BP sitting:   120 / 82  (left arm) Cuff size:   large  Vitals Entered By: Benny Lennert CMA Duncan Dull) (November 24, 2009 3:45 PM)  History of Present Illness: Chief complaint 1 month follow up  Chest pain improved...never started nexium.Marland Kitchenis  on 40 mg daily of omeprazole..rare indigestion.   Seen at Urgent care for sinus infection, bronchitis.. 4/8.Marland Kitchengiven Zpack Never took prednisone for mild wheeze.  Ears fulll..but resolved facial pain pressure. No fever.  Still with nagging dry cough. No shortness of breath.  Occ dizzyness. Continue intermittant posterior headache, occuring . Feels like back of head moving. No balance issues..but did fall unsure why last week. No new weakness in legs, NO numbness.  Walking, but not exercising much.    Problems Prior to Update: 1)  Abdominal Pain, Generalized  (ICD-789.07) 2)  Sinusitis- Acute-nos  (ICD-461.9) 3)  Chest Pain, Acute  (ICD-786.50) 4)  Paresthesia  (ICD-782.0) 5)  Arm Pain, Left  (ICD-729.5) 6)  Palpitations, Chronic  (ICD-785.1) 7)  Headache  (ICD-784.0) 8)  Dizziness  (ICD-780.4) 9)  Internal Hemorrhoids Without Mention Comp  (ICD-455.0) 10)  Ibs  (ICD-564.1) 11)  Fatigue, Chronic  (ICD-780.79) 12)  Cough  (ICD-786.2) 13)  Routine Gynecological Examination  (ICD-V72.31) 14)  Well Woman  (ICD-V70.0) 15)  Other Screening Mammogram  (ICD-V76.12) 16)  Uti  (ICD-599.0) 17)  Abdominal Pain, Chronic  (ICD-789.00) 18)  Common Migraine  (ICD-346.10) 19)  Heart Murmur, Hx of  (ICD-V12.50) 20)  Genital Herpes  (ICD-054.10) 21)  Hypertension  (ICD-401.9) 22)  Hyperlipidemia  (ICD-272.4) 23)  Gerd  (ICD-530.81) 24)  Depression  (ICD-311) 25)  Osteoarthritis  (ICD-715.90) 26)  Anemia-iron  Deficiency  (ICD-280.9)  Current Medications (verified): 1)  Valtrex 500 Mg  Tabs (Valacyclovir Hcl) .... Take 1 Tablet By Mouth Once A Day 2)  Calcium Citrate-Vitamin D 315-200 Mg-Unit  Tabs (Calcium Citrate-Vitamin D) .... Take 1 Tablet By Mouth Once A Day 3)  Vitamin B Complex-C   Caps (B Complex-C) .... Take 1 Capsule By Mouth Once A Day 4)  Simvastatin 40 Mg Tabs (Simvastatin) .Marland Kitchen.. 1 Tab By Mouth Daily 5)  Hydrochlorothiazide 25 Mg Tabs (Hydrochlorothiazide) .Marland Kitchen.. 1 Tab By Mouth Daily 6)  Omeprazole 40 Mg Cpdr (Omeprazole) .... Take 1 Tablet By Mouth Once A Day 7)  Fluticasone Propionate 50 Mcg/act Susp (Fluticasone Propionate) .... 2 Sprays Per Nostril Daily  Allergies: 1)  ! Codeine  Past History:  Past medical, surgical, family and social histories (including risk factors) reviewed, and no changes noted (except as noted below).  Past Medical History: Reviewed history from 11/05/2009 and no changes required. Anemia-iron deficiency Osteoarthritis Depression GERD Hyperlipidemia Hypertension Small Bowel Obstruction Urinary Tract Infection Stress test 09/2009 low risk  Past Surgical History: Reviewed history from 02/12/2008 and no changes required. hysterectomy, partial, vaginal 1998 2006 bilateral oopherectomy  for large cyst Barium swallow 2006: hiatal hernia 2007 barium follow thru 2007 because rectum twisted and could't do colonoscopy, was painful though  Family History: Reviewed history from 02/10/2009 and no changes required. father: MI late 20s, CABG, lung cancer 8 uncles with MI mother: breast cancer Family History  of Colon Polyps: Sister Family History of Liver Disease/Cirrhosis: Mat Aunt  Social History: Reviewed history from 02/10/2009 and no changes required. Occupation: Engineering geologist, Editor, [employer] Divorced 3 children healthy Never Smoked Alcohol use-yes Drug use-yes, remote but no injected Regular exercise-no Diet: fruits,  veggies, water Daily Caffeine Use 1 cup  Review of Systems General:  Complains of fatigue; denies fever. CV:  Denies chest pain or discomfort. Resp:  Denies shortness of breath. GI:  Denies abdominal pain. GU:  Denies dysuria.  Physical Exam  General:  alert and overweight-appearing, more interactive, more responsive affect than in past few appts.  Nose:  nasal dischargemucosal pallor.   Mouth:  good dentition, pharynx pink and moist, and postnasal drip.   Neck:  supple and no masses.   Lungs:  normal respiratory effort, R decreased breath sounds, and L decreased breath sounds.  with trace wheezing bilat Heart:  normal rate and regular rhythm.   Abdomen:  Bowel sounds positive,abdomen soft and mild tender diffuse right side without masses, organomegaly or hernias noted. Pulses:  R and L posterior tibial pulses are full and equal bilaterally  Extremities:  no edema, no erythema  Neurologic:  No cranial nerve deficits noted. Station and gait are normal. . DTRs are symmetrical throughout. Sensory, motor and coordinative functions appear intact. Skin:  8-9 mm nevus right upper leg..has been increasing in size ? over years multiple SK and hemangioma   Impression & Recommendations:  Problem # 1:  CHEST PAIN, ACUTE (ICD-786.50) Resolved with continued treatment of GERD.   Problem # 2:  COUGH (ICD-786.2) No clear continued infection...most likely due to allergies causing post nasal drip. Treat with nasal steroid and oral antihistamine.   Problem # 3:  HEADACHE (ICD-784.0) Continued unusaul posterior headache..no neuro changes. Not clearly migraineor radiating from cervical spine.Marland KitchenMarland Kitchen? tension headache, spasm in trapzius.  offered referral to headache center pt not interested at this time.   Problem # 4:  DEPRESSION (ICD-311) Continued concer that poorly controlled mood is reason for a lot of her somatic complaints. pt does seem happier and more interactive at this visit. Encouraged  exercise and weight loss.   Problem # 5:  HYPERTENSION (ICD-401.9) Well controlled. Continue current medication.  Her updated medication list for this problem includes:    Hydrochlorothiazide 25 Mg Tabs (Hydrochlorothiazide) .Marland Kitchen... 1 tab by mouth daily  BP today: 120/82 Prior BP: 102/62 (11/05/2009)  Prior 10 Yr Risk Heart Disease: 6 % (06/12/2008)  Labs Reviewed: K+: 3.6 (09/22/2009) Creat: : 0.8 (09/22/2009)   Chol: 159 (01/13/2009)   HDL: 66.20 (01/13/2009)   LDL: 72 (01/13/2009)   TG: 104.0 (01/13/2009)  Complete Medication List: 1)  Valtrex 500 Mg Tabs (Valacyclovir hcl) .... Take 1 tablet by mouth once a day 2)  Calcium Citrate-vitamin D 315-200 Mg-unit Tabs (Calcium citrate-vitamin d) .... Take 1 tablet by mouth once a day 3)  Vitamin B Complex-c Caps (B complex-c) .... Take 1 capsule by mouth once a day 4)  Simvastatin 40 Mg Tabs (Simvastatin) .Marland Kitchen.. 1 tab by mouth daily 5)  Hydrochlorothiazide 25 Mg Tabs (Hydrochlorothiazide) .Marland Kitchen.. 1 tab by mouth daily 6)  Omeprazole 40 Mg Cpdr (Omeprazole) .... Take 1 tablet by mouth once a day 7)  Fluticasone Propionate 50 Mcg/act Susp (Fluticasone propionate) .... 2 sprays per nostril daily  Patient Instructions: 1)  Start nasal steroid for eustacian tube dysfuncton. 2)  Can add Calritin or Zyrtec daily.  3)  Use aleve as needed headhace.  4)  Call if interested in headache wellness referral.  5)  Work on exercise. 6)  Please schedule a follow-up appointment in 3 months  Prescriptions: FLUTICASONE PROPIONATE 50 MCG/ACT SUSP (FLUTICASONE PROPIONATE) 2 sprays per nostril daily  #1 x 0   Entered and Authorized by:   Kerby Nora MD   Signed by:   Kerby Nora MD on 11/24/2009   Method used:   [facility]        [name] (retail)       [address]       [place], [place]  [address]       Ph: [phone] or [phone]       Fax: [phone]   RxID:   [id number]   Current Allergies (reviewed  today): ! CODEINE

## 2010-08-30 NOTE — Assessment & Plan Note (Signed)
Summary: INJURED LEFT LEG LAST NIGHT   Vital Signs:  Patient profile:   55 year old female Height:      68 inches Weight:      239 pounds BMI:     36.47 Temp:     99.3 degrees F oral Pulse rate:   80 / minute Pulse rhythm:   regular BP sitting:   106 / 70  (left arm) Cuff size:   large  Vitals Entered By: Lewanda Rife LPN (April 19, 2010 12:46 PM) CC: Slipped and fell last night. Injured left knee, left foot, right elbow and rt side of back hurts.   History of Present Illness: last pm -- slipped in some water -- in a narrow hallway and slipped barefoot R leg went up and L leg bent all the way back R elbow hit pantry  hit her back  twisted R ankle -- not too bad   L knee is her biggest concern-- - thought it dislocated but now able to walk on it  excruciating pain to bend it  did put ice on it and elbow last night  not too swollen   both knees give her trouble anyway  ? about a bit of arthritis    R elbow is sore - and there is a scrape on it  worse to bend  a little swollen    Allergies: 1)  ! Codeine  Past History:  Past Medical History: Last updated: 11/05/2009 Anemia-iron deficiency Osteoarthritis Depression GERD Hyperlipidemia Hypertension Small Bowel Obstruction Urinary Tract Infection Stress test 09/2009 low risk  Past Surgical History: Last updated: 02/12/2008 hysterectomy, partial, vaginal 1998 2006 bilateral oopherectomy  for large cyst Barium swallow 2006: hiatal hernia 2007 barium follow thru 2007 because rectum twisted and could't do colonoscopy, was painful though  Family History: Last updated: 02/10/2009 father: MI late 33s, CABG, lung cancer 8 uncles with MI mother: breast cancer Family History of Colon Polyps: Sister Family History of Liver Disease/Cirrhosis: Mat Aunt  Social History: Last updated: 02/10/2009 Occupation: Engineering geologist, [employer]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 Eyes:  Denies blurring. CV:  Denies chest pain or discomfort and palpitations. Resp:  Denies coughing up blood and shortness of breath. GI:  Denies nausea and vomiting. MS:  Complains of joint pain, joint swelling, and stiffness; denies cramps and muscle weakness. Derm:  Denies itching, lesion(s), poor wound healing, and rash. Neuro:  Denies numbness and tingling. Heme:  Denies abnormal bruising and bleeding.  Physical Exam  General:  obese and well appearing  Head:  no head trauma noted  normocephalic, atraumatic, no abnormalities observed, and no abnormalities palpated.   Eyes:  vision grossly intact, pupils equal, pupils round, and pupils reactive to light.   Neck:  nl rom without bony tenderness Lungs:  Normal respiratory effort, chest expands symmetrically. Lungs are clear to auscultation, no crackles or wheezes. Heart:  Normal rate and regular rhythm. S1 and S2 normal without gallop, murmur, click, rub or other extra sounds. Abdomen:  soft and non-tender.   Msk:  some tenderness in LS musculature  no ecchymosis   nl rom R ankle without pt tenderness  L knee- pain to flex over  30 degrees, no crepitice mild soft tissue swelling/ no eff slt ecchymosis under patella tender in lateral joint line and over patellar tendon   R elbow- full rom with some pain to pronate tender over lateral epidondyle and distal radius mild swelling if any no crepitice or ecchymosis Pulses:  R and L carotid,radial,femoral,dorsalis pedis and posterior tibial pulses are full and equal bilaterally Extremities:  no cce of ankles  Neurologic:  sensation intact to light touch and DTRs  symmetrical and normal.   Skin:  abrasion R forearm and L foot - not infected looking and superficial Cervical Nodes:  No lymphadenopathy noted Psych:  nl affect    Impression & Recommendations:  Problem # 1:  ELBOW PAIN (ICD-719.42) Assessment New after a fall  some lateral radial and epicondyle tenderness and pain (mild with pronation)  doubt fx but want to rule it out  x ray mobic for pain and update ice and relative rest  Orders: T-Elbow Rigt 2 View (73070TC) Prescription Created Electronically [phone])  Problem # 2:  KNEE PAIN (ICD-719.46) Assessment: New L knee pain after trauma and hyperflexion injury much pain to fully flex/ but seems stable med/ laterally no eff but some mild soft tissue swelling x ray today ice and relative rest / partial wt bear as tol mobic for pain and inflammation  if not imp may need eval for meniscal injury Her updated medication list for this problem includes:    Mobic 15 Mg Tabs (Meloxicam) .Marland Kitchen... 1 by mouth once daily with meal  Orders: T-Knee Left 2 view (73560TC)  Problem # 3:  ABRASION, ELBOW (ICD-913.0) Assessment: New  looks clean and non infected also one on footL  Td update  keep clean with antibacterial ointment   Orders: Prescription Created Electronically [phone])  Complete Medication List: 1)  Valtrex 500 Mg Tabs (Valacyclovir hcl) .... Take 1 tablet by mouth once a day 2)  Calcium Citrate-vitamin D 315-200 Mg-unit Tabs (Calcium citrate-vitamin d) .... Take 1 tablet by mouth once a day 3)  Vitamin B Complex-c Caps (B complex-c) .... Take 1 capsule by mouth once a day 4)  Simvastatin 40 Mg Tabs (Simvastatin) .Marland Kitchen.. 1 tab by mouth daily 5)  Hydrochlorothiazide 25 Mg Tabs (Hydrochlorothiazide) .Marland Kitchen.. 1 tab by mouth daily 6)  Omeprazole 40 Mg Cpdr (Omeprazole) .... Take 1 tablet by mouth once a day 7)  Fluticasone Propionate 50 Mcg/act Susp (Fluticasone propionate) .... 2 sprays per nostril daily as needed. 8)  Ferrous Sulfate  325 (65 Fe) Mg Tabs (Ferrous sulfate) .... Once tablet daily 9)  Glucosamine-chondroitin 1500-1200 Mg/3ml Liqd (Glucosamine-chondroitin) .... As directed on box 10)  Mobic 15 Mg Tabs (Meloxicam) .Marland Kitchen.. 1 by mouth once daily with meal  Other Orders: TD Toxoids IM 7 YR + (27782) Admin 1st Vaccine (42353) Admin 1st Vaccine [facility]) [phone])  Patient Instructions: 1)  use ice on elbow/ knee -- and other spots that are sore 2)  x ray today 3)  start mobic once daily with food -- stop it if GI upset  4)  we will make further plan when x ray report returns 5)  tetnus shot update today Prescriptions: MOBIC 15 MG TABS (MELOXICAM) 1 by mouth once daily with meal  #30 x 0   Entered and Authorized by:   Judith Part MD   Signed by:   Judith Part MD on 04/19/2010   Method used:   [facility]        [name] [id number]  [facility]       [address]       [place], [place]  [address]       Ph: [phone] or [phone]       Fax: [phone]   RxID:   [id number]   Current Allergies (reviewed today): ! CODEINE   Tetanus/Td Vaccine    Vaccine Type: Td    Site: left deltoid    Mfr: Sanofi Pasteur    Dose: 0.5 ml    Route: IM    Given by: Selena Batten Dance CMA (AAMA)    Exp. Date: 09/01/2011    Lot #: [id number]    VIS given: 06/17/08 version given April 19, 2010.     Prevention & Chronic Care Immunizations   Influenza vaccine: Fluvax 3+  (05/06/2009)   Influenza vaccine due: Refused  (04/15/2010)    Tetanus booster: 04/19/2010: Td   Tetanus booster due: 04/19/2020    Pneumococcal vaccine: Not documented  Colorectal Screening   Hemoccult: Not documented    Colonoscopy: Location:  [facility].    (02/25/2009)   Colonoscopy due: 01/2019  Other Screening   Pap smear: Not documented    Mammogram: ASSESSMENT: Negative - BI-RADS 1^MM DIGITAL SCREENING  (06/16/2008)   Mammogram due: 06/16/2009   Smoking status: never   (02/12/2008)  Lipids   Total Cholesterol: 159  (01/13/2009)   LDL: 72  (01/13/2009)   LDL Direct: 160.7  (09/14/2008)   HDL: 66.20  (01/13/2009)   Triglycerides: 104.0  (01/13/2009)    SGOT (AST): 29  (01/13/2009)   SGPT (ALT): 39  (01/13/2009)   Alkaline phosphatase: 62  (01/13/2009)   Total bilirubin: 0.7  (01/13/2009)  Hypertension   Last Blood Pressure: 106 / 70  (04/19/2010)   Serum creatinine: 0.8  (09/22/2009)   Serum potassium 3.6  (09/22/2009)  Self-Management Support :    Hypertension self-management support: Not documented    Lipid self-management support: Not documented

## 2010-08-30 NOTE — Assessment & Plan Note (Signed)
Summary: Cardiology Nuclear Study  Nuclear Med Background Indications for Stress Test: Evaluation for Ischemia   History: Myocardial Perfusion Study  History Comments: Heart murmur; '07 MPS:([place] normal per patient  Symptoms: Chest Pain, Chest Pressure, Diaphoresis, Dizziness, DOE, Fatigue, Palpitations, Rapid HR, SOB  Symptoms Comments: CP>left arm   Nuclear Pre-Procedure Cardiac Risk Factors: Family History - CAD, Hypertension, Lipids, Obesity Caffeine/Decaff Intake: none NPO After: 7:00 PM Lungs: Clear IV 0.9% NS with Angio Cath: 18g     IV Site: (R) AC IV Started by: Stanton Kidney EMT-P Chest Size (in) 46     Cup Size DDD     Height (in): 68 Weight (lb): 233 BMI: 35.56  Nuclear Med Study 1 or 2 day study:  1 day     Stress Test Type:  Stress Reading MD:  Marca Ancona, MD     Referring MD:  Kerby Nora, MD Resting Radionuclide:  Technetium 82m Tetrofosmin     Resting Radionuclide Dose:  11 mCi  Stress Radionuclide:  Technetium 64m Tetrofosmin     Stress Radionuclide Dose:  33 mCi   Stress Protocol Exercise Time (min):  9:00 min     Max HR:  153 bpm     Predicted Max HR:  167 bpm  Max Systolic BP: 157 mm Hg     Percent Max HR:  91.62 %     METS: 10.4 Rate Pressure Product:  69629    Stress Test Technologist:  Rea College CMA-N     Nuclear Technologist:  Domenic Polite CNMT  Rest Procedure  Myocardial perfusion imaging was performed at rest 45 minutes following the intravenous administration of Myoview Technetium 75m Tetrofosmin.  Stress Procedure  The patient exercised for nine minutes.  The patient stopped due to fatigue and denied any chest pain.  There were no diagnostic ST-T wave changes.  Myoview was injected at peak exercise and myocardial perfusion imaging was performed after a brief delay.  QPS Raw Data Images:  Normal; no motion artifact; normal heart/lung ratio. Stress Images:  NI: Uniform and normal uptake of tracer in all myocardial  segments. Rest Images:  Normal homogeneous uptake in all areas of the myocardium. Subtraction (SDS):  There is no evidence of scar or ischemia. Transient Ischemic Dilatation:  .90  (Normal <1.22)  Lung/Heart Ratio:  .20  (Normal <0.45)  Quantitative Gated Spect Images QGS EDV:  62 ml QGS ESV:  12 ml QGS EF:  81 % QGS cine images:  Normal wall motion.    Overall Impression  Exercise Capacity: Good exercise capacity. BP Response: Normal blood pressure response. Clinical Symptoms: Fatigue ECG Impression: Insignificant upsloping ST segment depression. Overall Impression: Normal stress nuclear study.  Appended Document: Orders Update Notify pt that stress test was low risk.   Clinical Lists Changes  Observations: Added new observation of PAST MED HX: Anemia-iron deficiency Osteoarthritis Depression GERD Hyperlipidemia Hypertension Small Bowel Obstruction Urinary Tract Infection Stress test 09/2009 low risk (11/05/2009 14:01)       Past Medical History:    Anemia-iron deficiency    Osteoarthritis    Depression    GERD    Hyperlipidemia    Hypertension    Small Bowel Obstruction    Urinary Tract Infection    Stress test 09/2009 low risk     Patient advised.Consuello Masse CMA

## 2010-08-30 NOTE — Progress Notes (Signed)
Summary: BP readings  Phone Note Call from Patient Call back at Home Phone [phone]   Caller: Patient Call For: Kerby Nora MD Summary of Call: Pt called with BP readings of 157/86 on 2/16- pulse 99, 141/83- pulse 95 on 2/17, 144/79- pulse 89 on 2/20, 143/78 - pulse 114 on 2/21.   Today it was 134/86. Initial call taken by: Lowella Petties CMA,  September 21, 2009 1:09 PM  Follow-up for Phone Call        Improved but not at goal .. will increase lisniopril HCTZ to 2 tabs by mouth plan on  daily..until out then we will refill at higher dose.  Prior to making that change though..she needs to come in ASAP to make sure BMET stable on new medicaiton Dx 401.1 (see previous phone note) then again 7-10 days after medicaiton level increased. Continue to follow at call with measurements in 1 week.  Follow-up by: Kerby Nora MD,  September 21, 2009 1:29 PM  Additional Follow-up for Phone Call Additional follow up Details #1::        Advised pt.  Lab appt made for tomorrow, she has follow up appt with on 3/01. Additional Follow-up by: Lowella Petties CMA,  September 21, 2009 3:08 PM    New/Updated Medications: LISINOPRIL-HYDROCHLOROTHIAZIDE 20-25 MG TABS (LISINOPRIL-HYDROCHLOROTHIAZIDE) Take 1 tablet by mouth once a day Prescriptions: LISINOPRIL-HYDROCHLOROTHIAZIDE 20-25 MG TABS (LISINOPRIL-HYDROCHLOROTHIAZIDE) Take 1 tablet by mouth once a day  #30 x 11   Entered and Authorized by:   Kerby Nora MD   Signed by:   Kerby Nora MD on 09/21/2009   Method used:   [facility]        [name] ([facility]ail)       [address]       [place], [place]  [address]       Ph: [phone] or [phone]       Fax: [phone]   RxID:   [id number]

## 2010-08-30 NOTE — Progress Notes (Signed)
Summary: Nuclear Pre-Procedure  Phone Note Outgoing Call Call back at [facility] Phone [phone]   Call placed by: Stanton Kidney, EMT-P,  November 03, 2009 3:41 PM Action Taken: Phone Call Completed Summary of Call: Left message with information on Myoview Information Sheet (see scanned document for details).     Nuclear Med Background Indications for Stress Test: Evaluation for Ischemia     Symptoms: Chest Pain, Dizziness, Fatigue, Palpitations, Rapid HR, SOB    Nuclear Pre-Procedure Cardiac Risk Factors: Family History - CAD, Hypertension, Lipids Height (in): 68.5

## 2010-08-30 NOTE — Progress Notes (Signed)
Summary: BP readings  Phone Note Call from Patient Call back at [phone]   Caller: Patient Call For: Kerby Nora MD Summary of Call: BP readings: 09/29/09 125/67 Pulse 40, 10/01/2009 106/73 Pulse 67, 10/02/2009 139/68 Pulse 57, 10/03/2009 142/68 Pulse 80, 10/04/2009 140/86 Pulse 90. Started HCTZ on 09/29/2009.   Initial call taken by: Linde Gillis CMA Duncan Dull),  October 04, 2009 8:29 AM  Follow-up for Phone Call        Noted, will forward to Dr. Ermalene Searing  no action needed today Follow-up by: Hannah Beat MD,  October 04, 2009 8:49 AM  Additional Follow-up for Phone Call Additional follow up Details #1::        Please call pt ..BPs well controlled...is she tolerating HCTZ better than lisinopril/HCTZ? Additional Follow-up by: Kerby Nora MD,  October 06, 2009 11:32 AM    Additional Follow-up for Phone Call Additional follow up Details #2::    Pt states she is tolerating the hctz better than lisinopril.                Lowella Petties CMA  October 06, 2009 11:43 AM

## 2010-08-30 NOTE — Progress Notes (Signed)
Summary: reporting BP readings  Phone Note Call from Patient Call back at Home Phone [phone]   Caller: Patient Call For: Mia Nora MD Summary of Call: Pt called to report BP readings.  2/24--130/70, pulse 78, 2/25---120/68, pulse 114, 2/26---118/60, pulse 85, 2/27---115/73, pulse 81, 2/28---124/84, pulse 89.  Pt has not yet increased her lisinopril dose, she has appt with you tomorrow afternoon. Initial call taken by: Lowella Petties CMA,  September 27, 2009 11:52 AM  Follow-up for Phone Call        NOTED, AEB to discuss in office Follow-up by: Hannah Beat MD,  September 27, 2009 5:18 PM

## 2010-08-30 NOTE — Progress Notes (Signed)
Summary: refill request for valtrex  Phone Note Refill Request Call back at Work Phone [phone] Message from:  Patient  Refills Requested: Medication #1:  VALTREX 500 MG  TABS Take 1 tablet by mouth once a day Please send to [facility].  Initial call taken by: Lowella Petties CMA,  September 13, 2009 9:19 AM    Prescriptions: VALTREX 500 MG  TABS (VALACYCLOVIR HCL) Take 1 tablet by mouth once a day  #90 x 3   Entered and Authorized by:   Hannah Beat MD   Signed by:   Hannah Beat MD on 09/13/2009   Method used:   Electronically to        [facility]* (mail-order)             ,          Ph: [phone]       Fax: [phone]   RxID:   [id number]

## 2010-08-30 NOTE — Progress Notes (Signed)
Summary: pt reporting BP readings  Phone Note Call from Patient Call back at Work Phone [phone]   Caller: Patient Call For: Kerby Nora MD Summary of Call: Pt called to report BP readings of 147/116, 153/45 and 169/100 from last week.  She checked it at [facility].  Pulse readings of [id number].  Pt called again today, she checked BP yesterday and it was 164/93, pulse of 95.  She is asking if you are going to start her on some medicine.  [facility]. Initial call taken by: Lowella Petties CMA,  September 13, 2009 3:30 PM  Follow-up for Phone Call        Recommend starting lisinopril /HCTZ . Needs BMET in 7-10 days after starting  Dx 401.1 let me know what pharm to send it to. Continue to follow BPs at home and call in 1-2weeks with measurments.  Follow-up by: Kerby Nora MD,  September 14, 2009 8:31 AM  Additional Follow-up for Phone Call Additional follow up Details #1::        [facility] Additional Follow-up by: Benny Lennert CMA Duncan Dull),  September 14, 2009 10:05 AM    Additional Follow-up for Phone Call Additional follow up Details #2::    Notify patient that the below prescription has been sent to the pharmacy electronically.  Is she scheduled for lab appt as requested?  Follow-up by: Kerby Nora MD,  September 14, 2009 2:19 PM  Additional Follow-up for Phone Call Additional follow up Details #3:: Details for Additional Follow-up Action Taken: Patient says that she will have labs done on march 1st when she comes in for a follow up with you.Heather M Woodard CMA  Heather Woodard CMA Duncan Dull),  September 14, 2009 2:45 PM    New/Updated Medications: LISINOPRIL-HYDROCHLOROTHIAZIDE 10-12.5 MG TABS (LISINOPRIL-HYDROCHLOROTHIAZIDE) 1 tab by mouth daily Prescriptions: LISINOPRIL-HYDROCHLOROTHIAZIDE 10-12.5 MG TABS (LISINOPRIL-HYDROCHLOROTHIAZIDE) 1 tab by mouth daily  #30 x 11   Entered and Authorized by:   Kerby Nora MD   Signed by:   Kerby Nora MD on 09/14/2009  Method used:   [facility]        [facility]  [name]       [address]       [place], [place]  [address]       Ph: [phone] or [phone]       Fax: [phone]   RxID:   [id number]

## 2010-08-30 NOTE — Assessment & Plan Note (Signed)
Summary: 4 wk f/u dlo   Vital Signs:  Patient profile:   55 year old female Height:      68.5 inches Weight:      235 pounds BMI:     35.34 Temp:     98.2 degrees F oral Pulse rate:   84 / minute Pulse rhythm:   regular BP sitting:   120 / 70  (left arm) Cuff size:   large  Vitals Entered By: Benny Lennert CMA Duncan Dull) (October 26, 2009 4:00 PM)  History of Present Illness: Chief complaint 4 wk follow up  HTN, improved control. Tolerating HCTZ better.  Has had some return of severe chest pain when lying down at night. No regurgitation. Taking 40 mg daily of omeprazole. Regurgitation better, but feels like meight  Some SOB when having severe pain in chest. No exertional pain. No further heart racing off lisinopril.  Continues to have cramp in in left 3rd and 4th digit.Marland Kitchenno further shoulder and arm pain. No swelling. nerve conduction was negative.  Fatigue..moderate control. Some good days and bad days. No increase in stress or anxiety.  Body pain all over..pain in knees. Chronic right abdominal pain.   Continued headache and occ dizzy spells. No vertigo. Feels like back of head moving.   Pressure in back of head.   No neuro changes. Continued  but better despite better BP control.    Problems Prior to Update: 1)  Bronchitis-acute  (ICD-466.0) 2)  Sinusitis- Acute-nos  (ICD-461.9) 3)  Chest Pain, Acute  (ICD-786.50) 4)  Paresthesia  (ICD-782.0) 5)  Arm Pain, Left  (ICD-729.5) 6)  Palpitations, Chronic  (ICD-785.1) 7)  Headache  (ICD-784.0) 8)  Dizziness  (ICD-780.4) 9)  Internal Hemorrhoids Without Mention Comp  (ICD-455.0) 10)  Ibs  (ICD-564.1) 11)  Fatigue, Chronic  (ICD-780.79) 12)  Cough  (ICD-786.2) 13)  Routine Gynecological Examination  (ICD-V72.31) 14)  Well Woman  (ICD-V70.0) 15)  Other Screening Mammogram  (ICD-V76.12) 16)  Uti  (ICD-599.0) 17)  Abdominal Pain, Chronic  (ICD-789.00) 18)  Common Migraine  (ICD-346.10) 19)  Heart Murmur, Hx of   (ICD-V12.50) 20)  Genital Herpes  (ICD-054.10) 21)  Hypertension  (ICD-401.9) 22)  Hyperlipidemia  (ICD-272.4) 23)  Gerd  (ICD-530.81) 24)  Depression  (ICD-311) 25)  Osteoarthritis  (ICD-715.90) 26)  Anemia-iron Deficiency  (ICD-280.9)  Current Medications (verified): 1)  Nexium 40 Mg Cpdr (Esomeprazole Magnesium) .Marland Kitchen.. 1 Tab By Mouth Daily 2)  Valtrex 500 Mg  Tabs (Valacyclovir Hcl) .... Take 1 Tablet By Mouth Once A Day 3)  Calcium Citrate-Vitamin D 315-200 Mg-Unit  Tabs (Calcium Citrate-Vitamin D) .... Take 1 Tablet By Mouth Once A Day 4)  Vitamin B Complex-C   Caps (B Complex-C) .... Take 1 Capsule By Mouth Once A Day 5)  Simvastatin 40 Mg Tabs (Simvastatin) .Marland Kitchen.. 1 Tab By Mouth Daily 6)  Hydrochlorothiazide 25 Mg Tabs (Hydrochlorothiazide) .Marland Kitchen.. 1 Tab By Mouth Daily  Allergies: 1)  ! Codeine  Past History:  Past medical, surgical, family and social histories (including risk factors) reviewed, and no changes noted (except as noted below).  Past Medical History: Reviewed history from 02/10/2009 and no changes required. Anemia-iron deficiency Osteoarthritis Depression GERD Hyperlipidemia Hypertension Small Bowel Obstruction Urinary Tract Infection  Past Surgical History: Reviewed history from 02/12/2008 and no changes required. hysterectomy, partial, vaginal 1998 2006 bilateral oopherectomy  for large cyst Barium swallow 2006: hiatal hernia 2007 barium follow thru 2007 because rectum twisted and could't do colonoscopy, was painful though  Family History: Reviewed history from 02/10/2009 and no changes required. father: MI late 3s, CABG, lung cancer 8 uncles with MI mother: breast cancer Family History of Colon Polyps: Sister Family History of Liver Disease/Cirrhosis: Mat Aunt  Social History: Reviewed history from 02/10/2009 and no changes required. 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 CV:  Denies chest pain or discomfort. Resp:  Complains of shortness of breath. GI:  Complains of abdominal pain; denies bloody stools, constipation, and diarrhea. GU:  Denies dysuria.  Physical Exam  General:  obese appearing  IN NAD Mouth:  Oral mucosa and oropharynx without lesions or exudates.  Teeth in good repair. Neck:  no carotid bruit or thyromegaly no cervical or supraclavicular lymphadenopathy  Lungs:  Normal respiratory effort, chest expands symmetrically. Lungs are clear to auscultation, no crackles or wheezes. Heart:  Normal rate and regular rhythm. S1 and S2 normal without gallop, murmur, click, rub or other extra sounds. Abdomen:  Bowel sounds positive,abdomen soft and mild tender diffuse right side without masses, organomegaly or hernias noted. Pulses:  R and L posterior tibial pulses are full and equal bilaterally  Extremities:  no edema    Impression & Recommendations:  Problem # 1:  CHEST PAIN, ACUTE (ICD-786.50) Continued.Marland Kitchengiven risk factors, althoug atypical chest pain will refer for stress test. EKG has been stable in past.  She feels omeprazole may have made it worse..no clear  relationship to food and GERD symtpoms resolved.  Will cahnge to nexoum to cover GERD, gastritis source of chest pain. Encouraged exercise, weight loss, healthy eating habits.  Orders: Cardiology Referral (Cardiology)  Problem # 2:  ARM PAIN, LEFT (ICD-729.5) Resolved.   Problem # 3:  HYPERTENSION (ICD-401.9) Improved control on HCTZ alone..side effects for lisinopril resolved off the medicaiton.  Her updated medication list for this problem includes:    Hydrochlorothiazide 25 Mg Tabs (Hydrochlorothiazide) .Marland Kitchen... 1 tab by mouth daily  Problem # 4:  HEADACHE (ICD-784.0) Minimal improvement with improvement of  BP.  Not typrical migraine..refer to [facility] if not imrpoving at next OV.   Problem # 5:  ABDOMINAL PAIN, GENERALIZED (ICD-789.07) ? due to IBS,GERD?gastritis. Recommended healthy diet, high fiber and water. Cahnge to nexium as previously stated.   Complete Medication List: 1)  Nexium 40 Mg Cpdr (Esomeprazole magnesium) .Marland Kitchen.. 1 tab by mouth daily 2)  Valtrex 500 Mg Tabs (Valacyclovir hcl) .... Take 1 tablet by mouth once a day 3)  Calcium Citrate-vitamin D 315-200 Mg-unit Tabs (Calcium citrate-vitamin d) .... Take 1 tablet by mouth once a day 4)  Vitamin B Complex-c Caps (B complex-c) .... Take 1 capsule by mouth once a day 5)  Simvastatin 40 Mg Tabs (Simvastatin) .Marland Kitchen.. 1 tab by mouth daily 6)  Hydrochlorothiazide 25 Mg Tabs (Hydrochlorothiazide) .Marland Kitchen.. 1 tab by mouth daily 7)  Azithromycin 250 Mg Tabs (Azithromycin) .... 2po qd for 1 day, then 1po qd for 4days, then stop 8)  Tessalon Perles 100 Mg Caps (Benzonatate) .Marland Kitchen.. 1 - 2 by mouth three times a day as needed 9)  Prednisone 10 Mg Tabs (Prednisone) .... 3po qd for 3days, then 2po qd for 3days, then 1po qd for 3days, then stop  Patient Instructions: 1)  Referral Appointment Information 2)  Day/Date: 3)  Time: 4)  Place/MD: 5)  Address: 6)  Phone/Fax: 7)  Patient given appointment information. Information/Orders faxed/mailed.  8)  Start nexium daily in place of omeprazole.  9)  Call if chest pain not imrpoving.  10)  Please schedule a follow-up appointment in 1 month 30 min appt.   Current Allergies (reviewed today): ! CODEINE

## 2010-08-30 NOTE — Assessment & Plan Note (Signed)
Summary: sinus pain,?bronchitis/bedsole-[facility] pt/cd   Vital Signs:  Patient profile:   55 year old female Height:      68 inches Weight:      235 pounds BMI:     35.86 O2 Sat:      95 % on Room air Temp:     98.2 degrees F oral Pulse rate:   94 / minute BP sitting:   102 / 62  (left arm) Cuff size:   large  Vitals Entered ByZella Ball Ewing (November 05, 2009 2:45 PM)  O2 Flow:  Room air CC: Sinus and Chest congestion, cough, Headaches/RE   CC:  Sinus and Chest congestion, cough, and Headaches/RE.  History of Present Illness: here with acute onset mild to mod 2 to 3 days facial pain, pressure, fever and greenish d/c with slight ST and just the beginnings it seems of mild wheezing/sob;  Pt denies CP,  orthopnea, pnd, worsening LE edema, palps, dizziness or syncope . Pt denies new neuro symptoms such as headache, facial or extremity weakness   Problems Prior to Update: 1)  Bronchitis-acute  (ICD-466.0) 2)  Sinusitis- Acute-nos  (ICD-461.9) 3)  Chest Pain, Acute  (ICD-786.50) 4)  Paresthesia  (ICD-782.0) 5)  Arm Pain, Left  (ICD-729.5) 6)  Palpitations, Chronic  (ICD-785.1) 7)  Headache  (ICD-784.0) 8)  Dizziness  (ICD-780.4) 9)  Internal Hemorrhoids Without Mention Comp  (ICD-455.0) 10)  Ibs  (ICD-564.1) 11)  Fatigue, Chronic  (ICD-780.79) 12)  Cough  (ICD-786.2) 13)  Routine Gynecological Examination  (ICD-V72.31) 14)  Well Woman  (ICD-V70.0) 15)  Other Screening Mammogram  (ICD-V76.12) 16)  Uti  (ICD-599.0) 17)  Abdominal Pain, Chronic  (ICD-789.00) 18)  Common Migraine  (ICD-346.10) 19)  Heart Murmur, Hx of  (ICD-V12.50) 20)  Genital Herpes  (ICD-054.10) 21)  Hypertension  (ICD-401.9) 22)  Hyperlipidemia  (ICD-272.4) 23)  Gerd  (ICD-530.81) 24)  Depression  (ICD-311) 25)  Osteoarthritis  (ICD-715.90) 26)  Anemia-iron Deficiency  (ICD-280.9)  Medications Prior to Update: 1)  Nexium 40 Mg Cpdr (Esomeprazole Magnesium) .Marland Kitchen.. 1 Tab By Mouth Daily 2)  Valtrex 500 Mg   Tabs (Valacyclovir Hcl) .... Take 1 Tablet By Mouth Once A Day 3)  Calcium Citrate-Vitamin D 315-200 Mg-Unit  Tabs (Calcium Citrate-Vitamin D) .... Take 1 Tablet By Mouth Once A Day 4)  Vitamin B Complex-C   Caps (B Complex-C) .... Take 1 Capsule By Mouth Once A Day 5)  Simvastatin 40 Mg Tabs (Simvastatin) .Marland Kitchen.. 1 Tab By Mouth Daily 6)  Hydrochlorothiazide 25 Mg Tabs (Hydrochlorothiazide) .Marland Kitchen.. 1 Tab By Mouth Daily  Current Medications (verified): 1)  Nexium 40 Mg Cpdr (Esomeprazole Magnesium) .Marland Kitchen.. 1 Tab By Mouth Daily 2)  Valtrex 500 Mg  Tabs (Valacyclovir Hcl) .... Take 1 Tablet By Mouth Once A Day 3)  Calcium Citrate-Vitamin D 315-200 Mg-Unit  Tabs (Calcium Citrate-Vitamin D) .... Take 1 Tablet By Mouth Once A Day 4)  Vitamin B Complex-C   Caps (B Complex-C) .... Take 1 Capsule By Mouth Once A Day 5)  Simvastatin 40 Mg Tabs (Simvastatin) .Marland Kitchen.. 1 Tab By Mouth Daily 6)  Hydrochlorothiazide 25 Mg Tabs (Hydrochlorothiazide) .Marland Kitchen.. 1 Tab By Mouth Daily 7)  Azithromycin 250 Mg Tabs (Azithromycin) .... 2po Qd For 1 Day, Then 1po Qd For 4days, Then Stop 8)  Tessalon Perles 100 Mg Caps (Benzonatate) .Marland Kitchen.. 1 - 2 By Mouth Three Times A Day As Needed 9)  Prednisone 10 Mg Tabs (Prednisone) .... 3po Qd For 3days, Then  2po Qd For 3days, Then 1po Qd For 3days, Then Stop  Allergies (verified): 1)  ! 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 , mild ill  Head:  normocephalic and atraumatic.   Eyes:  vision grossly intact, pupils equal, and pupils round.   Ears:  bilat tm's red, sinus tender bilat  Nose:  nasal dischargemucosal pallor and mucosal edema.   Mouth:  pharyngeal erythema and fair dentition.   Neck:  supple and no masses.   Lungs:  normal respiratory effort, R decreased breath sounds, and L decreased breath sounds.  with trace wheezing bilat Heart:  normal rate and regular rhythm.   Extremities:  no edema, no erythema  Skin:  color normal and no rashes.     Impression & Recommendations:  Problem # 1:  SINUSITIS- ACUTE-NOS (ICD-461.9)  Her updated medication list for this problem includes:    Azithromycin 250 Mg Tabs (Azithromycin) .Marland Kitchen... 2po qd for 1 day, then 1po qd for 4days, then stop    Tessalon Perles 100 Mg Caps (Benzonatate) .Marland Kitchen... 1 - 2 by mouth three times a day as needed treat as above, f/u any worsening signs or symptoms   Problem # 2:  BRONCHITIS-ACUTE (ICD-466.0)  with mild wheeze , for prednisone burst and taper off  Her updated medication list for this problem includes:    Azithromycin 250 Mg Tabs (Azithromycin) .Marland Kitchen... 2po qd for 1 day, then 1po qd for 4days, then stop    Tessalon Perles 100 Mg Caps (Benzonatate) .Marland Kitchen... 1 - 2 by mouth three times a day as needed  Problem # 3:  HYPERTENSION (ICD-401.9)  Her updated medication list for this problem includes:    Hydrochlorothiazide 25 Mg Tabs (Hydrochlorothiazide) .Marland Kitchen... 1 tab by mouth daily  BP today: 102/62 Prior BP: 120/78 (09/28/2009)  Prior 10 Yr Risk Heart Disease: 6 % (06/12/2008)  Labs  Reviewed: K+: 3.6 (09/22/2009) Creat: : 0.8 (09/22/2009)   Chol: 159 (01/13/2009)   HDL: 66.20 (01/13/2009)   LDL: 72 (01/13/2009)   TG: 104.0 (01/13/2009) stable overall by hx and exam, ok to continue meds/tx as is   Complete Medication List: 1)  Nexium 40 Mg Cpdr (Esomeprazole magnesium) .Marland Kitchen.. 1 tab by mouth daily 2)  Valtrex 500 Mg Tabs (Valacyclovir hcl) .... Take 1 tablet by mouth once a day 3)  Calcium Citrate-vitamin D 315-200 Mg-unit Tabs (Calcium citrate-vitamin d) .... Take 1 tablet by mouth once a day 4)  Vitamin B Complex-c Caps (B complex-c) .... Take 1 capsule by mouth once a day 5)  Simvastatin 40 Mg Tabs (Simvastatin) .Marland Kitchen.. 1 tab by mouth daily 6)  Hydrochlorothiazide 25 Mg Tabs (Hydrochlorothiazide) .Marland Kitchen.. 1 tab by mouth daily 7)  Azithromycin 250 Mg  Tabs (Azithromycin) .... 2po qd for 1 day, then 1po qd for 4days, then stop 8)  Tessalon Perles 100 Mg Caps (Benzonatate) .Marland Kitchen.. 1 - 2 by mouth three times a day as needed 9)  Prednisone 10 Mg Tabs (Prednisone) .... 3po qd for 3days, then 2po qd for 3days, then 1po qd for 3days, then stop  Patient Instructions: 1)  Please take all new medications as prescribed 2)  Continue all previous medications as before this visit  3)  Please schedule an appointment with your primary doctor as needed: Prescriptions: PREDNISONE 10 MG TABS (PREDNISONE) 3po qd for 3days, then 2po qd for 3days, then 1po qd for 3days, then stop  #18 x 0   Entered and Authorized by:   Corwin Levins MD   Signed by:   Corwin Levins MD on 11/05/2009   Method used:   Print then Give to Patient   RxID:   [id number] TESSALON PERLES 100 MG CAPS (BENZONATATE) 1 - 2 by mouth three times a day as needed  #50 x 1   Entered and Authorized by:   Corwin Levins MD   Signed by:   Corwin Levins MD on 11/05/2009   Method used:   [facility]        [facility]  [name]       [address]       [place], [place]  [address]       Ph: [phone] or  [phone]       Fax: [phone]   RxID:   [id number] AZITHROMYCIN 250 MG TABS (AZITHROMYCIN) 2po qd for 1 day, then 1po qd for 4days, then stop  #6 x 1   Entered and Authorized by:   Corwin Levins MD   Signed by:   Corwin Levins MD on 11/05/2009   Method used:   [facility]        [facility]  [name] (retail)       [address]       [place], [place]  [address]       Ph: [phone] or [phone]       Fax: [phone]   RxID:   [id number] AZITHROMYCIN 250 MG TABS (AZITHROMYCIN) 2po qd for 1 day, then 1po qd for 4days, then stop  #6 x 1   Entered and Authorized by:   Corwin Levins MD   Signed by:   Corwin Levins MD on 11/05/2009   Method used:   Print then Give to Patient   RxID:   [id number]

## 2010-08-30 NOTE — Assessment & Plan Note (Signed)
Summary: 30 MIN F/U PER MD HTN AND MULTIPLE ISSUE   Vital Signs:  Patient profile:   55 year old female Height:      68.5 inches Weight:      237.8 pounds BMI:     35.76 Temp:     98.4 degrees F oral Pulse rate:   84 / minute Pulse rhythm:   regular BP sitting:   120 / 78  (left arm) Cuff size:   large  Vitals Entered By: Benny Lennert CMA Duncan Dull) (September 28, 2009 4:07 PM)  History of Present Illness: Chief complaint htn multiple medical issues  HTN improved on lisinopril/HCTZ...but since starting the med she feels worse..she never increased the BP medication.   2/24--130/70, pulse 78, 2/25---120/68, pulse 114, 2/26---118/60, pulse 85, 2/27---115/73, pulse 81, 2/28---124/84, pulse 89 Headaches continue Feels hotter. One episode of lightheadedness and SOB. Cramps in arms and hands, wrists. Cough since starting medicaiton.  Racing of heart in chest x several months. Has had worseing in last year. Has had normal stress test several years ago.   Fatigue, chronic .Marland Kitchenjust as bad as usual. Depression, poor control but refuses medicaiton.   Left shoulder pain: Left arm falls asleep, occ sharp pain. Occurs when not moving for a while. Occurs daily..whan awaking in AM or when falling asleep . Occ neck painArm pain not triggered by neck movements.  Pain at rest, no specifc movement makes it worse. No weakness in hand.   Works on Animator  Problems Prior to Update: 1)  Paresthesia  (ICD-782.0) 2)  Arm Pain, Left  (ICD-729.5) 3)  Palpitations, Chronic  (ICD-785.1) 4)  Headache  (ICD-784.0) 5)  Dizziness  (ICD-780.4) 6)  Internal Hemorrhoids Without Mention Comp  (ICD-455.0) 7)  Ibs  (ICD-564.1) 8)  Fatigue, Chronic  (ICD-780.79) 9)  Cough  (ICD-786.2) 10)  Routine Gynecological Examination  (ICD-V72.31) 11)  Well Woman  (ICD-V70.0) 12)  Other Screening Mammogram  (ICD-V76.12) 13)  Uti  (ICD-599.0) 14)  Abdominal Pain, Chronic  (ICD-789.00) 15)  Common Migraine   (ICD-346.10) 16)  Heart Murmur, Hx of  (ICD-V12.50) 17)  Genital Herpes  (ICD-054.10) 18)  Hypertension  (ICD-401.9) 19)  Hyperlipidemia  (ICD-272.4) 20)  Gerd  (ICD-530.81) 21)  Depression  (ICD-311) 22)  Osteoarthritis  (ICD-715.90) 23)  Anemia-iron Deficiency  (ICD-280.9)  Current Medications (verified): 1)  Omeprazole 40 Mg Cpdr (Omeprazole) .Marland Kitchen.. 1 Tab By Mouth Daily 2)  Valtrex 500 Mg  Tabs (Valacyclovir Hcl) .... Take 1 Tablet By Mouth Once A Day 3)  Calcium Citrate-Vitamin D 315-200 Mg-Unit  Tabs (Calcium Citrate-Vitamin D) .... Take 1 Tablet By Mouth Once A Day 4)  Vitamin B Complex-C   Caps (B Complex-C) .... Take 1 Capsule By Mouth Once A Day 5)  Simvastatin 40 Mg Tabs (Simvastatin) .Marland Kitchen.. 1 Tab By Mouth Daily 6)  Proair Hfa 108 (90 Base) Mcg/act Aers (Albuterol Sulfate) .... 2 Spray Inh Every 4-6 Hours As Needed Wheeze, Coughing Fits 7)  Hydrochlorothiazide 25 Mg Tabs (Hydrochlorothiazide) .Marland Kitchen.. 1 Tab By Mouth Daily  Allergies: 1)  ! Codeine  Past History:  Past medical, surgical, family and social histories (including risk factors) reviewed, and no changes noted (except as noted below).  Past Medical History: Reviewed history from 02/10/2009 and no changes required. Anemia-iron deficiency Osteoarthritis Depression GERD Hyperlipidemia Hypertension Small Bowel Obstruction Urinary Tract Infection  Past Surgical History: Reviewed history from 02/12/2008 and no changes required. hysterectomy, partial, vaginal 1998 2006 bilateral oopherectomy  for large cyst Barium swallow  2006: hiatal hernia 2007 barium follow thru 2007 because rectum twisted and could't do colonoscopy, was painful though  Family History: Reviewed history from 02/10/2009 and no changes required. father: MI late 57s, CABG, lung cancer 8 uncles with MI mother: breast cancer Family History of Colon Polyps: Sister Family History of Liver Disease/Cirrhosis: Mat Aunt  Social History: Reviewed  history from 02/10/2009 and no changes required. Occupation: Engineering geologist, Editor, commissioning [employer] Divorced 3 children healthy Never Smoked Alcohol use-yes Drug use-yes, remote but no injected Regular exercise-no Diet: fruits, veggies, water Daily Caffeine Use 1 cup  Review of Systems      See HPI General:  Complains of fatigue; weight gain. CV:  Denies chest pain or discomfort; Occ regurgitates food...frequently having when taking medicaiton.Marland Kitchen  Physical Exam  General:  obese appearing  IN NAD Mouth:  Oral mucosa and oropharynx without lesions or exudates.  Teeth in good repair. Neck:  no carotid bruit or thyromegaly no cervical or supraclavicular lymphadenopathy  Lungs:  Normal respiratory effort, chest expands symmetrically. Lungs are clear to auscultation, no crackles or wheezes. Heart:  Normal rate and regular rhythm. S1 and S2 normal without gallop, murmur, click, rub or other extra sounds. Msk:  multiple tender points throughout body left shoulder: full ROM, neg impingement sign, neg Nerr's, neg Phalens diffuse upper arm pain, no subacromial pain full strength in upper and lower extremeties Neg spurling and full ROM in neck Pulses:  R and L posterior tibial pulses are full and equal bilaterally  Extremities:  no edema  Neurologic:  No cranial nerve deficits noted. Station and gait are normal. Plantar reflexes are down-going bilaterally. DTRs are symmetrical throughout. Sensory, motor and coordinative functions appear intact. Skin:  Intact without suspicious lesions or rashes Psych:  Oriented X3, memory intact for recent and remote, and flat affect.     Impression & Recommendations:  Problem # 1:  HYPERTENSION (ICD-401.9) Feels worse on BP med, although BP well controlled. Stop lisinoprila dn continue HCTZ at 25 mg dose. Follow Bps closely. Call if continuing  SE, She would feel better overall if started losing weight and exercsieing..encouraged her to  begin as long as no worsening of symptoms.  Her updated medication list for this problem includes:    Hydrochlorothiazide 25 Mg Tabs (Hydrochlorothiazide) .Marland Kitchen... 1 tab by mouth daily  Problem # 2:  ARM PAIN, LEFT (ICD-729.5) Given parasthesia.Marland Kitchenno clear nerve compression syndrome with neg exam. Will eval further with nerve conduction and EMG eval.  Orders: Misc. Referral (Misc. Ref)  Problem # 3:  PALPITATIONS, CHRONIC (ICD-785.1) EKG NSR, no ST, Q changes, no LVH. No arrythmia.  Orders: EKG w/ Interpretation (93000)  Problem # 4:  GERD (ICD-530.81) Poor currennt control..increase omeprazole to 40 mg daily.  Her updated medication list for this problem includes:    Omeprazole 40 Mg Cpdr (Omeprazole) .Marland Kitchen... 1 tab by mouth daily  Problem # 5:  DEPRESSION (ICD-311)  And Chronic fatigue. Work up negative with labs. Poor control depression and fibromyalgia likely likely linked to her numerous somatic complaints. Encouraged her to consider trial of Effexor. She is hesitant given  (per pt) past SE and aggrtessive trendencies when on SSRIs in past.   CLOSE FOLLOW UP FOR MULTIPLE ISSUES..ALL NOT COVERED TODAY.  Complete Medication List: 1)  Omeprazole 40 Mg Cpdr (Omeprazole) .Marland Kitchen.. 1 tab by mouth daily 2)  Valtrex 500 Mg Tabs (Valacyclovir hcl) .... Take 1 tablet by mouth once a day 3)  Calcium Citrate-vitamin D 315-200  Mg-unit Tabs (Calcium citrate-vitamin d) .... Take 1 tablet by mouth once a day 4)  Vitamin B Complex-c Caps (B complex-c) .... Take 1 capsule by mouth once a day 5)  Simvastatin 40 Mg Tabs (Simvastatin) .Marland Kitchen.. 1 tab by mouth daily 6)  Proair Hfa 108 (90 Base) Mcg/act Aers (Albuterol sulfate) .... 2 spray inh every 4-6 hours as needed wheeze, coughing fits 7)  Hydrochlorothiazide 25 Mg Tabs (Hydrochlorothiazide) .Marland Kitchen.. 1 tab by mouth daily  Patient Instructions: 1)  Follow BP on hydrochlorothiazide..call  with measurments. 2)  Stop lisninopril/HCTZ. 3)  May exercise but stop if  chest pain.  4)  INcrease omeprazole to 40 mg daily.  5)  Referral Appointment Information 6)  Day/Date: 7)  Time: 8)  Place/MD: 9)  Address: 10)  Phone/Fax: 11)  Patient given appointment information. Information/Orders faxed/mailed.  12)  Follow up appt in 3-4 weeks 30 min OV.  Prescriptions: OMEPRAZOLE 40 MG CPDR (OMEPRAZOLE) 1 tab by mouth daily  #90 x 3   Entered and Authorized by:   Kerby Nora MD   Signed by:   Kerby Nora MD on 09/28/2009   Method used:   Electronically to        [facility]* (mail-order)             ,          Ph: [phone]       Fax: [phone]   RxID:   [id number] OMEPRAZOLE 40 MG CPDR (OMEPRAZOLE) 1 tab by mouth daily  #30 x 11   Entered and Authorized by:   Kerby Nora MD   Signed by:   Kerby Nora MD on 09/28/2009   Method used:   Electronically to        [name] [id number] (retail)       [address]       [place], [place]  [address]       Ph: [phone] or [phone]       Fax: [phone]   RxID:   [id number] HYDROCHLOROTHIAZIDE 25 MG TABS (HYDROCHLOROTHIAZIDE) 1 tab by mouth daily  #30 x 11   Entered and Authorized by:   Kerby Nora MD   Signed by:   Kerby Nora MD on 09/28/2009   Method used:   [facility]ically to        [name] (retail)       [address]       [place], [place]  [address]       Ph: [phone] or [phone]       Fax: [phone]   RxID:   [id number]   Current Allergies (reviewed today): ! CODEINE

## 2010-10-20 ENCOUNTER — Encounter: Payer: Self-pay | Admitting: Family Medicine

## 2010-10-25 ENCOUNTER — Ambulatory Visit (INDEPENDENT_AMBULATORY_CARE_PROVIDER_SITE_OTHER): Payer: 59 | Admitting: Family Medicine

## 2010-10-25 ENCOUNTER — Encounter: Payer: Self-pay | Admitting: Family Medicine

## 2010-10-25 VITALS — BP 114/74 | HR 80 | Temp 98.5°F | Ht 67.5 in | Wt 235.8 lb

## 2010-10-25 DIAGNOSIS — R002 Palpitations: Secondary | ICD-10-CM

## 2010-10-25 DIAGNOSIS — Z136 Encounter for screening for cardiovascular disorders: Secondary | ICD-10-CM

## 2010-10-25 LAB — BASIC METABOLIC PANEL
CO2: 31 mEq/L (ref 19–32)
GFR: 60.51 mL/min (ref >60.00)
Glucose, Bld: 81 mg/dL (ref 70–99)
Potassium: 3.6 mEq/L (ref 3.5–5.1)
Sodium: 141 mEq/L (ref 135–145)

## 2010-10-25 NOTE — Progress Notes (Signed)
Last week with B knee steroid injections (Dr. August Saucer).  The next day she has some tightness in chest and palpitations. She had some sweats and nausea at the time. It came on at rest.  She had some chest pain all day but was gradually improving.  Sweats lasted about .  She felt sob during the episode.  All sx finally stopped later in the evening.  Since then, she'll feel 'an adrenaline rush' and heart will feel like it is racing.  Had been checking BP; SBP was up to 140s recently and then back to normal, ~120, in last few days.  She hasn't had exertional sx.   Prev with unremarkable stress test a few years ago.    No h/o CAD or MI.   Father with CABG in his 20s.   Meds, vitals, and allergies reviewed.   ROS: See HPI.  Otherwise, noncontributory.  GEN: nad, alert and oriented HEENT: mucous membranes moist NECK: supple w/o LA, no TMG CV: rrr.  no murmur PULM: ctab, no inc wob ABD: soft, +bs EXT: no edema SKIN: no acute rash

## 2010-10-25 NOTE — Patient Instructions (Signed)
You can get your results through our phone system.  Follow the instructions on the blue card. Let us know if your symptoms continue.  Don't change your meds.  Take care.

## 2010-10-25 NOTE — Assessment & Plan Note (Addendum)
Now with return of sx.  Prev with neg stress test and no exertional sx.  I wonder if some of the sx could be related to steroid exposure.  I don't think this is an allergy and I d/w pt about this.  EKG today w/o sig change from prev.  Will checkk basic labs.  See notes on labs.  No sx now and okay for outpatient fu.  If sx continue, she may need cards referral for consideration of outpatient monitor.  She agrees with plan.

## 2010-11-21 ENCOUNTER — Encounter: Payer: Self-pay | Admitting: Family Medicine

## 2010-11-21 ENCOUNTER — Ambulatory Visit (INDEPENDENT_AMBULATORY_CARE_PROVIDER_SITE_OTHER): Payer: 59 | Admitting: Family Medicine

## 2010-11-21 ENCOUNTER — Ambulatory Visit: Payer: 59 | Admitting: Family Medicine

## 2010-11-21 VITALS — BP 110/80 | HR 77 | Temp 98.3°F | Ht 67.5 in | Wt 242.4 lb

## 2010-11-21 DIAGNOSIS — W57XXXA Bitten or stung by nonvenomous insect and other nonvenomous arthropods, initial encounter: Secondary | ICD-10-CM

## 2010-11-21 DIAGNOSIS — T148XXA Other injury of unspecified body region, initial encounter: Secondary | ICD-10-CM

## 2010-11-21 DIAGNOSIS — T148 Other injury of unspecified body region: Secondary | ICD-10-CM

## 2010-11-21 MED ORDER — DOXYCYCLINE HYCLATE 100 MG PO CAPS: 100.0000 mg | ORAL_CAPSULE | Freq: Two times a day (BID) | ORAL | Status: AC

## 2010-11-21 NOTE — Assessment & Plan Note (Signed)
Possible spider bite although appears to be a mild local reaction with some body aches. Advised supportive care, will place on prophylactic abx, doxycyline. Tetanus immunization UTD. See pt instructions for details.

## 2010-11-21 NOTE — Progress Notes (Signed)
Chief complaint ? spider bite.  Mowing grass this this Saturday. Woke up early that morning with itchy and throbbing sensation of right lower leg. Noticed a small bite mark, but by the following morning was a quarter sized raised lesion.  No pus, warmth. Still itchy. Has had some body aches.  No fevers, chills, nausea or vomiting.  Did not actually see a spider or other insect.  Tetanus immunization UTD, within this year.        The PMH, PSH, Social History, Family History, Medications, and allergies have been reviewed in [facility], and have been updated if relevant.   Review of Systems  General:  Complains of fatigue; denies fever. CV:  Denies chest pain or discomfort. Resp:  Denies shortness of breath.  Physical Exam BP 110/80  Pulse 77  Temp(Src) 98.3 F (36.8 C) (Oral)  Ht 5' 7.5" (1.715 m)  Wt 242 lb 6.4 oz (109.952 kg)  BMI 37.40 kg/m2  General:  alert and overweight-appearing, more interactive, more responsive affect than in past few appts.  Mouth:  MMM Lungs:  Normal respiratory effort, chest expands symmetrically. Lungs are clear to auscultation, no crackles or wheezes. Heart:  Normal rate and regular rhythm. S1 and S2 normal without gallop, murmur, click, rub or other extra sounds. Skin:   Raised quarter sized erythematous lesion on back of right lower leg, no signs of necrosis or  Surrounding erythema.

## 2010-11-21 NOTE — Patient Instructions (Signed)
    Spider Bites Your exam suggests you have a spider bite. Spider bites may not cause any pain at first. But there will usually be a local reaction. This would be swelling, redness, and pain within 1-2 days. Most spiders can cause moderate local irritation with a bite. But two spiders can cause serious reactions:  Black widow. The black widow is a shiny black spider. It has long legs and a red or orange hourglass-shaped marking on its belly. There is usually immediate pain and swelling at the site of the bite. The toxin from this spider can cause severe muscle spasms and pain. Chest or abdominal pain, breathing problems, vomiting, and uncontrolled twitching can occur. Hospital care, pain medicine, and anti-toxin may be needed in severe reactions.   Brown recluse (fiddle back) and aggressive house spiders. Bites from either of these two spiders are not always painful at first. But the toxins released usually cause a severe local reaction including pain, redness, and blisters. Infection may complicate the bite. So antibiotics are often needed. If a large sore develops, surgery may be needed to remove damaged tissue.    Routine treatment of most spider bites includes: rest, elevation of the affected area, cold packs for 2-3 days, pain medicine, and possibly antihistamines (Benadryl as needed) to reduce itching. Oral antibiotics and a tetanus booster are also sometimes needed. Please call your caregiver if your pain increases or the spider bite is not any better after 3 days of treatment. Call right away or go to the emergency room if the bite looks infected or turns purple, or if you have painful muscle spasms.

## 2011-01-06 ENCOUNTER — Other Ambulatory Visit: Payer: Self-pay | Admitting: Family Medicine

## 2011-03-03 ENCOUNTER — Ambulatory Visit (INDEPENDENT_AMBULATORY_CARE_PROVIDER_SITE_OTHER): Payer: 59 | Admitting: Family Medicine

## 2011-03-03 ENCOUNTER — Encounter: Payer: Self-pay | Admitting: Family Medicine

## 2011-03-03 DIAGNOSIS — M79609 Pain in unspecified limb: Secondary | ICD-10-CM

## 2011-03-03 DIAGNOSIS — M79606 Pain in leg, unspecified: Secondary | ICD-10-CM

## 2011-03-03 DIAGNOSIS — R42 Dizziness and giddiness: Secondary | ICD-10-CM

## 2011-03-03 DIAGNOSIS — F329 Major depressive disorder, single episode, unspecified: Secondary | ICD-10-CM

## 2011-03-03 DIAGNOSIS — R110 Nausea: Secondary | ICD-10-CM

## 2011-03-03 DIAGNOSIS — R51 Headache: Secondary | ICD-10-CM

## 2011-03-03 DIAGNOSIS — I10 Essential (primary) hypertension: Secondary | ICD-10-CM

## 2011-03-03 NOTE — Assessment & Plan Note (Signed)
Unusual chronic posterior headahce worsening recetnly, no w assocaited nause dizziness and unusual interemittant neurologic symptoms.

## 2011-03-03 NOTE — Progress Notes (Signed)
Subjective:    Patient ID: Mia Kline, female    DOB: 1956-03-04, 55 y.o.   MRN: [id number]  HPI   55 year old female with history of multiple medical complaints presents with episode of sudden fatigue, nausea, sweaty, felt out of balance all of a sudden, balance off.  Improved some with lying down. but still severe fatigue. At the time felt no palpitations, but felt chest pressure. BP running okay at home on HCTZ. No new meds except celebrex for knees.  Seen in 09/2010 for palpitation, nml EKG at that time. Felt due to recent steroid injections.  10/2009 nml nuclear stress test for chest pain.  Continues with posterior headache..feels like "back of head spinning" This issue has been going on for years, but worsening recently. Having some issues with memory lapses, ongoing daily for past year or longer. She feels that this has worsened.  Vision has declined with far away distance, occ blurred vision.   Over due for CPX.      Review of Systems  HENT: Negative for ear discharge.        Has noted some dried blood  In left ear.  Occ feels like hearing changes from ear to ear  Respiratory: Negative for cough and shortness of breath.   Cardiovascular: Negative for palpitations and leg swelling.  Neurological: Positive for dizziness, weakness, light-headedness, numbness and headaches. Negative for facial asymmetry.       Occ intermittant numbness in arms and legs.       Objective:   Physical Exam  Constitutional: Vital signs are normal. She appears well-developed and well-nourished. She is cooperative.  Non-toxic appearance. She does not appear ill. No distress.  HENT:  Head: Normocephalic.  Right Ear: Hearing, tympanic membrane, external ear and ear canal normal. Tympanic membrane is not erythematous, not retracted and not bulging.  Left Ear: Hearing, tympanic membrane, external ear and ear canal normal. Tympanic membrane is not erythematous, not retracted and not bulging.    Nose: No mucosal edema or rhinorrhea. Right sinus exhibits no maxillary sinus tenderness and no frontal sinus tenderness. Left sinus exhibits no maxillary sinus tenderness and no frontal sinus tenderness.  Mouth/Throat: Uvula is midline, oropharynx is clear and moist and mucous membranes are normal.  Eyes: Conjunctivae, EOM and lids are normal. Pupils are equal, round, and reactive to light. No foreign bodies found.  Neck: Trachea normal and normal range of motion. Neck supple. Carotid bruit is not present. No mass and no thyromegaly present.  Cardiovascular: Normal rate, regular rhythm, S1 normal, S2 normal, normal heart sounds, intact distal pulses and normal pulses.  Exam reveals no gallop and no friction rub.   No murmur heard. Pulmonary/Chest: Effort normal and breath sounds normal. Not tachypneic. No respiratory distress. She has no decreased breath sounds. She has no wheezes. She has no rhonchi. She has no rales.  Abdominal: Soft. Normal appearance and bowel sounds are normal. There is no tenderness.  Neurological: She is alert. She has normal reflexes. She displays no atrophy. No cranial nerve deficit or sensory deficit. She exhibits normal muscle tone. She displays a negative Romberg sign. Coordination and gait normal.  Skin: Skin is warm, dry and intact. No rash noted.  Psychiatric: Her speech is normal. Judgment and thought content normal. Her mood appears not anxious. Her affect is blunt. She is slowed and withdrawn. Cognition and memory are normal. She exhibits a depressed mood.       As always she has very flat affect.  Assessment & Plan:

## 2011-03-03 NOTE — Patient Instructions (Addendum)
Please stop at front desk to scheuled MRI brain.  We will call you with the results.  Call if any change in symptoms.

## 2011-03-07 ENCOUNTER — Ambulatory Visit
Admission: RE | Admit: 2011-03-07 | Discharge: 2011-03-07 | Disposition: A | Discharge status: Home / Self Care | Payer: 59 | Source: Ambulatory Visit | Attending: Family Medicine | Admitting: Family Medicine

## 2011-03-07 DIAGNOSIS — R42 Dizziness and giddiness: Secondary | ICD-10-CM

## 2011-03-07 DIAGNOSIS — R110 Nausea: Secondary | ICD-10-CM

## 2011-03-07 DIAGNOSIS — R51 Headache: Secondary | ICD-10-CM

## 2011-03-09 ENCOUNTER — Telehealth: Payer: Self-pay | Admitting: Family Medicine

## 2011-03-09 DIAGNOSIS — R209 Unspecified disturbances of skin sensation: Secondary | ICD-10-CM

## 2011-03-09 DIAGNOSIS — R42 Dizziness and giddiness: Secondary | ICD-10-CM

## 2011-03-09 DIAGNOSIS — R51 Headache: Secondary | ICD-10-CM

## 2011-03-09 NOTE — Telephone Encounter (Signed)
Referral  To neurology. Mri brain wnl.

## 2011-03-20 ENCOUNTER — Telehealth: Payer: Self-pay | Admitting: Family Medicine

## 2011-03-20 DIAGNOSIS — E785 Hyperlipidemia, unspecified: Secondary | ICD-10-CM

## 2011-03-20 DIAGNOSIS — R209 Unspecified disturbances of skin sensation: Secondary | ICD-10-CM

## 2011-03-20 DIAGNOSIS — R5381 Other malaise: Secondary | ICD-10-CM

## 2011-03-20 NOTE — Telephone Encounter (Signed)
Message copied by Excell Seltzer on Mon Mar 20, 2011  1:04 PM ------      Message from: Baldomero Lamy      Created: Fri Mar 10, 2011  1:14 PM      Regarding: cpx labs  tues 8/21       Please order  future cpx labs for pt's upcomming lab appt.      Thanks      Rodney Booze

## 2011-03-21 ENCOUNTER — Other Ambulatory Visit (INDEPENDENT_AMBULATORY_CARE_PROVIDER_SITE_OTHER): Payer: 59

## 2011-03-21 DIAGNOSIS — R209 Unspecified disturbances of skin sensation: Secondary | ICD-10-CM

## 2011-03-21 DIAGNOSIS — R5381 Other malaise: Secondary | ICD-10-CM

## 2011-03-21 DIAGNOSIS — E785 Hyperlipidemia, unspecified: Secondary | ICD-10-CM

## 2011-03-21 DIAGNOSIS — R5383 Other fatigue: Secondary | ICD-10-CM

## 2011-03-21 DIAGNOSIS — E7800 Pure hypercholesterolemia, unspecified: Secondary | ICD-10-CM

## 2011-03-21 LAB — COMPREHENSIVE METABOLIC PANEL
Albumin: 4.2 g/dL (ref 3.5–5.2)
BUN: 20 mg/dL (ref 6–23)
CO2: 27 mEq/L (ref 19–32)
Calcium: 9 mg/dL (ref 8.4–10.5)
Chloride: 105 mEq/L (ref 96–112)
Glucose, Bld: 97 mg/dL (ref 70–99)
Potassium: 3.8 mEq/L (ref 3.5–5.1)
Total Protein: 6.9 g/dL (ref 6.0–8.3)

## 2011-03-21 LAB — LIPID PANEL
Cholesterol: 168 mg/dL (ref 0–200)
Total CHOL/HDL Ratio: 3
Triglycerides: 145 mg/dL (ref 0.0–149.0)

## 2011-03-21 LAB — CBC WITH DIFFERENTIAL/PLATELET
Basophils Absolute: 0 10*3/uL (ref 0.0–0.1)
Basophils Relative: 0.6 % (ref 0.0–3.0)
HCT: 41 % (ref 36.0–46.0)
Hemoglobin: 13.3 g/dL (ref 12.0–15.0)
Lymphs Abs: 2.6 10*3/uL (ref 0.7–4.0)
MCHC: 32.5 g/dL (ref 30.0–36.0)
Monocytes Relative: 7.5 % (ref 3.0–12.0)
Neutro Abs: 3.6 10*3/uL (ref 1.4–7.7)
RBC: 4.78 Mil/uL (ref 3.87–5.11)
RDW: 14.1 % (ref 11.5–14.6)

## 2011-03-24 ENCOUNTER — Encounter: Payer: Self-pay | Admitting: Family Medicine

## 2011-03-24 ENCOUNTER — Ambulatory Visit (INDEPENDENT_AMBULATORY_CARE_PROVIDER_SITE_OTHER): Payer: 59 | Admitting: Family Medicine

## 2011-03-24 VITALS — BP 120/72 | HR 80 | Temp 98.4°F | Ht 68.5 in | Wt 240.0 lb

## 2011-03-24 DIAGNOSIS — I10 Essential (primary) hypertension: Secondary | ICD-10-CM

## 2011-03-24 DIAGNOSIS — L293 Anogenital pruritus, unspecified: Secondary | ICD-10-CM

## 2011-03-24 DIAGNOSIS — M79609 Pain in unspecified limb: Secondary | ICD-10-CM

## 2011-03-24 DIAGNOSIS — M79606 Pain in leg, unspecified: Secondary | ICD-10-CM

## 2011-03-24 DIAGNOSIS — R059 Cough, unspecified: Secondary | ICD-10-CM

## 2011-03-24 DIAGNOSIS — R51 Headache: Secondary | ICD-10-CM

## 2011-03-24 DIAGNOSIS — N898 Other specified noninflammatory disorders of vagina: Secondary | ICD-10-CM

## 2011-03-24 DIAGNOSIS — D509 Iron deficiency anemia, unspecified: Secondary | ICD-10-CM

## 2011-03-24 DIAGNOSIS — R42 Dizziness and giddiness: Secondary | ICD-10-CM

## 2011-03-24 DIAGNOSIS — R05 Cough: Secondary | ICD-10-CM

## 2011-03-24 DIAGNOSIS — E785 Hyperlipidemia, unspecified: Secondary | ICD-10-CM

## 2011-03-24 DIAGNOSIS — Z1231 Encounter for screening mammogram for malignant neoplasm of breast: Secondary | ICD-10-CM

## 2011-03-24 DIAGNOSIS — Z0000 Encounter for general adult medical examination without abnormal findings: Secondary | ICD-10-CM

## 2011-03-24 DIAGNOSIS — R7989 Other specified abnormal findings of blood chemistry: Secondary | ICD-10-CM

## 2011-03-24 MED ORDER — FLUCONAZOLE 150 MG PO TABS: 150.0000 mg | ORAL_TABLET | Freq: Once | ORAL | Status: AC

## 2011-03-24 MED ORDER — SIMVASTATIN 20 MG PO TABS: 20.0000 mg | ORAL_TABLET | Freq: Every day | ORAL | Status: DC

## 2011-03-24 NOTE — Assessment & Plan Note (Signed)
No clear lab abnormality.  Not clearly vertigo. HAs pending neuro appt. To eval this along with unusual headache.

## 2011-03-24 NOTE — Assessment & Plan Note (Signed)
Well controlled, goal <130. Trial of lower dose statin.

## 2011-03-24 NOTE — Progress Notes (Signed)
Subjective:    Patient ID: Mia Kline, female    DOB: 12/28/1955, 55 y.o.   MRN: [id number]  HPI  55 year old female with multiple medical complaints here for CPX.  She has pending neurologic appt to look into her posterior headache and unusual parasthesias.  Recent MRI brain was in normal limits. She also has intermittant dizziness spells, feeling out of balance. Recent labs show normla B12, nml electrolytes.  Hypertension:  Well controlled on HCTZ.  Using medication without problems: Don't think so, but see above. Chest pain with exertion: Edema: Short of breath: Average home BPs: Other issues:  Elevated Cholesterol: Well controlled on fish oil and simvastatin 40 daily. Using medications without problems: She does have leg pain.Marland Kitchen Possibly due to med... Also elevated LFTs. Muscle aches: Yes Other complaints:  Elevated LFTs, new: [place] of abdomen in 2010 showed mild fatty liver. On statin med. No ETOH, tylenol use. Aunt with history of cirrhosis of liver.  She is having vaginal discharge,yellow to creamy no itchiness.    Review of Systems  Constitutional: Negative for fever and fatigue.  HENT: Negative for ear pain.   Eyes: Negative for pain.  Respiratory: Negative for chest tightness and shortness of breath.   Cardiovascular: Negative for chest pain, palpitations and leg swelling.  Gastrointestinal: Negative for abdominal pain.  Genitourinary: Positive for vaginal discharge. Negative for dysuria, urgency, flank pain, decreased urine volume, vaginal bleeding, vaginal pain and pelvic pain.  Musculoskeletal:       Leg pain  Neurological: Positive for dizziness, light-headedness and headaches. Negative for tremors, seizures, syncope, speech difficulty and weakness.  Psychiatric/Behavioral: Positive for dysphoric mood.       Objective:   Physical Exam  Constitutional: Vital signs are normal. She appears well-developed and well-nourished. She is cooperative.  Non-toxic  appearance. She does not appear ill. No distress.  HENT:  Head: Normocephalic.  Right Ear: Hearing, tympanic membrane, external ear and ear canal normal.  Left Ear: Hearing, tympanic membrane, external ear and ear canal normal.  Nose: Nose normal.  Eyes: Conjunctivae, EOM and lids are normal. Pupils are equal, round, and reactive to light. No foreign bodies found.  Neck: Trachea normal and normal range of motion. Neck supple. Carotid bruit is not present. No mass and no thyromegaly present.  Cardiovascular: Normal rate, regular rhythm, S1 normal, S2 normal, normal heart sounds and intact distal pulses.  Exam reveals no gallop.   No murmur heard. Pulmonary/Chest: Effort normal and breath sounds normal. No respiratory distress. She has no wheezes. She has no rhonchi. She has no rales.  Abdominal: Soft. Normal appearance and bowel sounds are normal. She exhibits no distension, no fluid wave, no abdominal bruit and no mass. There is no hepatosplenomegaly. There is no tenderness. There is no rebound, no guarding and no CVA tenderness. No hernia.  Genitourinary: No breast swelling, tenderness, discharge or bleeding. Pelvic exam was performed with patient prone. No labial fusion. There is no rash, lesion or injury on the right labia. There is no rash, tenderness, lesion or injury on the left labia.  Lymphadenopathy:    She has no cervical adenopathy.    She has no axillary adenopathy.  Neurological: She is alert. She has normal strength. No cranial nerve deficit or sensory deficit.  Skin: Skin is warm, dry and intact. No rash noted.  Psychiatric: Her speech is normal and behavior is normal. Judgment normal. Her mood appears not anxious. Cognition and memory are normal. She does not exhibit a depressed  mood.          Assessment & Plan:  Complete Physical Exam: The patient's preventative maintenance and recommended screening tests for an annual wellness exam were reviewed in full today. Brought up  to date unless services declined.  Counselled on the importance of diet, exercise, and its role in overall health and mortality. The patient's FH and SH was reviewed, including their home life, tobacco status, and drug and alcohol status.   Partial hysterectomy, no BSO no pap neeeded, no DVE. Due for mammogram, mother with breast cancer history. Up to Date with vaccines. Colon cancer screening: 02/25/2009 nml, rpt in 10 years.

## 2011-03-24 NOTE — Assessment & Plan Note (Signed)
Due to fatty liver and possibly statin.

## 2011-03-24 NOTE — Assessment & Plan Note (Signed)
Possibly due to statin. Cholesterol with tolerate lower dose. Decrease to 20 mg simvastatin daily, rechsck in 3 months.

## 2011-03-24 NOTE — Patient Instructions (Addendum)
Decrease cholesterol medication to 20 mg daily in hopes of improved leg pain and other symptoms, with continued cholesterol control. Stop at front desk to set up Mammogram with Shirlee Limerick. Try Zyrtec at bedtime for allergies/ cough. Fluconazole x 1 tablet for  yeast infection.

## 2011-03-24 NOTE — Assessment & Plan Note (Signed)
Pending appt with neuro.

## 2011-03-24 NOTE — Assessment & Plan Note (Signed)
Well controlled. Continue current medication.  

## 2011-03-24 NOTE — Assessment & Plan Note (Signed)
Resolved

## 2011-03-24 NOTE — Assessment & Plan Note (Addendum)
Wet prep showed: Mild yeast infection. Treat with fluconazole.

## 2011-03-24 NOTE — Assessment & Plan Note (Signed)
No clear med SE. Likely due to allergies... Trial of zyrtec.

## 2011-03-27 ENCOUNTER — Ambulatory Visit
Admission: RE | Admit: 2011-03-27 | Discharge: 2011-03-27 | Disposition: A | Discharge status: Home / Self Care | Payer: 59 | Source: Ambulatory Visit | Attending: Family Medicine | Admitting: Family Medicine

## 2011-03-27 DIAGNOSIS — Z1231 Encounter for screening mammogram for malignant neoplasm of breast: Secondary | ICD-10-CM

## 2011-03-28 ENCOUNTER — Encounter: Payer: Self-pay | Admitting: Neurology

## 2011-03-28 ENCOUNTER — Ambulatory Visit (INDEPENDENT_AMBULATORY_CARE_PROVIDER_SITE_OTHER): Payer: 59 | Admitting: Neurology

## 2011-03-28 DIAGNOSIS — F329 Major depressive disorder, single episode, unspecified: Secondary | ICD-10-CM

## 2011-03-28 DIAGNOSIS — R413 Other amnesia: Secondary | ICD-10-CM

## 2011-03-28 MED ORDER — SUMATRIPTAN SUCCINATE 50 MG PO TABS: 50.0000 mg | ORAL_TABLET | Freq: Once | ORAL | Status: DC | PRN

## 2011-03-28 NOTE — Progress Notes (Signed)
Dear Dr. Ermalene Searing,  Thank you for having me see Tanazia Achee in clinic today for her multiple neurologic symptoms. As you may recall she is a 55 year old female with a history of depression who is having problems with dizzy spells as well as headaches and memory problems.  She describes the dizzy spells as having been gone on for about a year and describes them as feelings of "her brain spinning". They happen multiple times during the day and last seconds. They're occurring every day. She cannot endorse any precipitating maneuvers. She does mention that perhaps when she moves her head back too quickly it brings them on. She has never fallen because of these spells. She denies any loss of consciousness. There is never any tongue biting or bowel or bladder loss. If a spell was observed by someone it would not be noticed.  She also has a problem with headaches that have gone on years. These seem to be unrelated to her dizzy spells. They occur almost every day. Several times a month she gets a "bad headache". During these she feels nauseated, with the pain being localized to her bilateral temples as well as behind the eyes. They're described as throbbing with photophobia and phonophobia. She typically takes Aleve and needs to go lie down. She uses analgesia infrequently. These are different then the head pains that occur everyday and are localized to the back of her head.  Notably an MRI of her brain was noted to be normal.  Finally she is complaining of memory problems. She is having difficulty keeping track of tasks at home. She says that she sometimes forgets how to use functions of the computer software at work. She is worried that this is going to get in the way of her work.  She denies any interval worsening of depression over the same period as her memory problems. She denies any acute event that might have made these worse.  Her medical history is significant for a lifetime problem with depression.  She also has multiple other somatic complaints.  Social history: She works at [employer]. She does not smoke tobacco he uses alcohol.  Family history: Noncontributory.  Review of systems: 14 systems were reviewed and is significant for bilateral knee pain. Much of the review of systems is diffusely positive but also significant for difficulty with sleeping.  Examination: Filed Vitals:   03/28/11 1336  BP: 118/78  Pulse: 100   In general she is a obese appearing female in no acute distress.Head and Neck Exam:  No lymphadenopathy, no scleral icterus, no conjunctival pallor.Chest Exam:  Clear to auscultation bilaterally.  No wheezes or crackles.Cardiovascular system:  No carotid bruits.  Regular rate and rhythm.  Normal and symmetric peripheral pulses.Skin:  No obvous rashes, hypopigmented macules or neurofibromas. Normal extremities.  She is oriented x4 and her language is intact. Pupils are equally round and reactive to light fundi are benign. Extraocular movements are intact. Visual fields are full to confrontation muscles of facial expression are symmetric tongue and palate midline. Shoulder shrug intact. Motor exam reveals normal bulk and tone of out of 5 strength bilaterally. Reflexes 2+ bilaterally. Coordination reveals normal finger to nose testing. Sensation is intact to light touch bilaterally. Gait and station aren't normal. Tandem gait is intact.  Impression: Ms. Boffa is a 55 year old female with a history of depression who presents with multiple somatic complaints today. Concerning her chronic headaches, I am currently not sure of the cause.  However, I do believe  her "bad headaches" are migrainous in nature.  I have offered her sumatriptan as an abortive instead of Aleve, in order to see if it may be more effective.  I've given her a prescription for Imitrex 50 mg to be used as soon as a migraine headache begins. She may repeat this after 2 hours if it is ineffective. This is  lower than my typical dose of 100mg  as she is worried about the Imitrex precipitating anxiety attacks.  If however, a single dose of 50mg  is ineffective then we can increase this to 100mg  if tolerated, with another 100mg  in 2 hours if the headache is not aborted.  The short spells that she describes are very nonspecific.. It is possible given the paroxysmal nature that these represent simple partial seizures although I think this is very unlikely. I am going to get a routine EEG however. Given how frequently they occur there is a chance that they might occur during the EEG itself.  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 I am going to refer her to [facility] neuropsychology for testing. I would normally refer her to Dr. Eula Flax at [facility] but he is going to be out on sick leave so we will send her to [facility].  This NP testing will most importantly give [place] a baseline to follow and help [place] rule out any organic cause to her memory complaints.  Thank you for having [place] see her in consultation.  Feel free to contact me with any questions.  Lupita Raider Modesto Charon, MD [facility] Neurology, [place] [address] [place], [place] [address] Phone: [phone] Fax: [phone].

## 2011-03-28 NOTE — Patient Instructions (Signed)
You have been scheduled for an EEG at [facility] hospital this Thursday, Aug. 30 at 2:00pm.  Please arrive by 1:45pm at 1st floor admitting. We will call you with the appointment to [facility].  cc:  Kerby Nora, MD

## 2011-03-30 ENCOUNTER — Ambulatory Visit (HOSPITAL_COMMUNITY)
Admission: RE | Admit: 2011-03-30 | Discharge: 2011-03-30 | Disposition: A | Discharge status: Home / Self Care | Payer: 59 | Source: Ambulatory Visit | Attending: Neurology | Admitting: Neurology

## 2011-03-30 DIAGNOSIS — R42 Dizziness and giddiness: Secondary | ICD-10-CM | POA: Insufficient documentation

## 2011-03-30 DIAGNOSIS — R413 Other amnesia: Secondary | ICD-10-CM

## 2011-03-30 DIAGNOSIS — Z1389 Encounter for screening for other disorder: Secondary | ICD-10-CM | POA: Insufficient documentation

## 2011-03-30 DIAGNOSIS — F329 Major depressive disorder, single episode, unspecified: Secondary | ICD-10-CM

## 2011-04-04 ENCOUNTER — Telehealth: Payer: Self-pay

## 2011-04-04 NOTE — Assessment & Plan Note (Signed)
She denies that this is a current issue that she wishes to treat.

## 2011-04-04 NOTE — Assessment & Plan Note (Signed)
Chronic intermittant. No clear lab abnormality in past, no orthostatic hypotension, not clearly med SE. Nml neuro exam. Not described as vertigo celarly. Will likly refer to neuro if MRI negative given persistant unusual symptoms and neg eval so far.

## 2011-04-04 NOTE — Assessment & Plan Note (Signed)
Eval so far negative, consider SE to statin at next OV.

## 2011-04-04 NOTE — Telephone Encounter (Signed)
Pt aware of appt with [facility] for her neuropsych eval on 04/27/11 at 8:30 with Dr. Tacey Heap.

## 2011-04-04 NOTE — Procedures (Signed)
EEG NUMBER:  [id number].  This routine EEG was requested in a 55 year old female who is having spells of dizziness, lasting seconds.  The purpose of this EEG is to determine if there are any interictal epileptiform discharges that can corroborate that this may be related to subclinical seizures.  She is on no anticonvulsant medications.  The EEG was done with a patient awake and drowsy.  During periods of maximal wakefulness, she had an 11-12 cycle per second posterior dominant rhythm that attenuates with eye opening and was symmetric. Background activity was characterized by low-amplitude beta and alpha activities that were symmetric.  Photic stimulation produced a low-voltage symmetric driving response. Hyperventilation did not markedly change the tracing.  The patient did become drowsy as evidenced by burst of bitemporal theta activities sometimes that were asymmetric.  She did not enter stage 2 sleep.  CLINICAL INTERPRETATION:  This routine EEG done with a patient awake and drowsy is normal.          ______________________________ Denton Meek, MD    [id number] D:  04/04/2011 12:36:38  T:  04/04/2011 22:15:19  Job #:  [id number]

## 2011-04-04 NOTE — Assessment & Plan Note (Signed)
Well controlled. Continue current medication.  

## 2011-04-05 ENCOUNTER — Ambulatory Visit: Payer: 59 | Admitting: Family Medicine

## 2011-05-01 ENCOUNTER — Encounter: Payer: Self-pay | Admitting: Internal Medicine

## 2011-05-01 ENCOUNTER — Ambulatory Visit (INDEPENDENT_AMBULATORY_CARE_PROVIDER_SITE_OTHER): Payer: 59 | Admitting: Internal Medicine

## 2011-05-01 VITALS — BP 124/78 | HR 74 | Temp 98.0°F | Wt 237.0 lb

## 2011-05-01 DIAGNOSIS — S43409A Unspecified sprain of unspecified shoulder joint, initial encounter: Secondary | ICD-10-CM

## 2011-05-01 DIAGNOSIS — IMO0002 Reserved for concepts with insufficient information to code with codable children: Secondary | ICD-10-CM

## 2011-05-01 MED ORDER — CYCLOBENZAPRINE HCL 10 MG PO TABS: 5.0000 mg | ORAL_TABLET | Freq: Every evening | ORAL | Status: AC | PRN

## 2011-05-01 NOTE — Progress Notes (Signed)
Subjective:    Patient ID: Mia Kline, female    DOB: Jan 05, 1956, 55 y.o.   MRN: [id number]  HPI Woke early Sunday AM with bad arm pain Couldn't make it better Then used sleeping pill  Doesn't remember any injury Couldn't lift  it up or use fingers correctly Used other arm to lift it up to keep it loose  Tried her pain pills from knees and tried heat  Still hurts today so called for appt Now with some pain in neck and left leg also--today  Has had "crick in neck" before but no known disc disease  Current Outpatient Prescriptions on File Prior to Visit  Medication Sig Dispense Refill  . calcium citrate-vitamin D (CALCIUM + D) 315-200 MG-UNIT per tablet Take 1 tablet by mouth 2 (two) times daily.        . celecoxib (CELEBREX) 200 MG capsule Take 200 mg by mouth 2 (two) times daily.        . Cyanocobalamin (VITAMIN B-12) 2500 MCG SUBL Place under the tongue.        . hydrochlorothiazide 25 MG tablet TAKE 1 TABLET DAILY  90 tablet  1  . OMEGA 3 1000 MG CAPS Take by mouth daily.        Marland Kitchen omeprazole (PRILOSEC) 40 MG capsule Take 40 mg by mouth daily.        . simvastatin (ZOCOR) 20 MG tablet Take 1 tablet (20 mg total) by mouth at bedtime.  90 tablet  3  . SUMAtriptan (IMITREX) 50 MG tablet Take 1 tablet (50 mg total) by mouth once as needed (take 1 tablet as soon as headache begins, make take another in 2 hours.  max 2 per day, 8 per week.).  9 tablet  1  . traMADol (ULTRAM) 50 MG tablet Take 50 mg by mouth every 8 (eight) hours as needed.        . valACYclovir (VALTREX) 500 MG tablet Take 500 mg by mouth daily.          Allergies  Allergen Reactions  . Codeine     REACTION: Nausea    Past Medical History  Diagnosis Date  . Abdominal pain, unspecified site   . Abdominal pain, generalized   . Elbow, forearm, and wrist, abrasion or friction burn, without mention of infection   . Anemia     Iron deficiency  . Arm pain, left   . Chest pain, unspecified   . Common migraine    . Cough   . Acute cystitis   . Depression   . Dizziness and giddiness   . Elbow pain   . Other malaise and fatigue   . Genital herpes, unspecified   . GERD (gastroesophageal reflux disease)   . Headache   . Heart murmur   . Hyperlipidemia   . Hypertension   . IBS (irritable bowel syndrome)   . Internal hemorrhoids without mention of complication   . Knee pain, left   . Microscopic hematuria   . Osteoarthrosis, unspecified whether generalized or localized, unspecified site   . Other screening mammogram   . Palpitations   . Paresthesia   . Routine gynecological examination   . Acute sinusitis, unspecified   . Urinary tract infection, site not specified   . Routine general medical examination at a health care facility   . Small bowel obstruction     Past Surgical History  Procedure Date  . Cardiovascular stress test 09/2009    Low risk  . Partial  hysterectomy 1998    Vaginal  . Bilateral oophorectomy 2006    For large cyst  . Barium swallow 2006    Hiatal hernia  . Barium follow through 2007    because rectum twisted and couldn't do colonoscopy, was painful though    Family History  Problem Relation Age of Onset  . Cancer Mother     Breast  . Cancer Father     Lung  . Heart disease Father     CABG  . Colon polyps Sister   . Cancer Maternal Aunt     Cirrhosis of liver    History   Social History  . Marital Status: Single    Spouse Name: N/A    Number of Children: 3  . Years of Education: N/A   Occupational History  . Medical sales representative, [employer], [employer]    Social History Main Topics  . Smoking status: Never Smoker   . Smokeless tobacco: Never Used  . Alcohol Use: Yes  . Drug Use: Yes     Remote but no injected.  Marland Kitchen Sexually Active: Not on file   Other Topics Concern  . Not on file   Social History Narrative   No regular exercise.  Diet:  Fruits and Vegetables and water.  Daily caffeine use:  1 cup.   Review of Systems       Objective:   Physical Exam  Constitutional: She appears well-developed and well-nourished.  Neck: Normal range of motion.       No tightness in muscles  Musculoskeletal:       Left shoulder has limited ROM--abduction to not quite 90 degrees, very little external rotation, mild limited internal rotation  Elbow and wrist are quiet  Neurological:       Mild decreased grip strength on left          Assessment & Plan:

## 2011-05-01 NOTE — Patient Instructions (Signed)
Please try heat and the muscle relaxer cyclobenzaprine at bedtime Continue gentle range of motion exercises with the shoulder If you are not improving within a week or so, call Dr August Saucer to arrange an evaluation

## 2011-05-02 ENCOUNTER — Ambulatory Visit: Payer: 59 | Admitting: Family Medicine

## 2011-05-14 NOTE — Progress Notes (Signed)
Memory testing normal.  NP report reviewed.  Significant contribution of anxiety and depression.

## 2011-06-12 ENCOUNTER — Encounter: Payer: Self-pay | Admitting: Family Medicine

## 2011-06-12 ENCOUNTER — Ambulatory Visit (INDEPENDENT_AMBULATORY_CARE_PROVIDER_SITE_OTHER): Payer: 59 | Admitting: Family Medicine

## 2011-06-12 DIAGNOSIS — M79606 Pain in leg, unspecified: Secondary | ICD-10-CM

## 2011-06-12 DIAGNOSIS — F329 Major depressive disorder, single episode, unspecified: Secondary | ICD-10-CM

## 2011-06-12 DIAGNOSIS — R51 Headache: Secondary | ICD-10-CM

## 2011-06-12 DIAGNOSIS — M79609 Pain in unspecified limb: Secondary | ICD-10-CM

## 2011-06-12 DIAGNOSIS — R42 Dizziness and giddiness: Secondary | ICD-10-CM

## 2011-06-12 NOTE — Assessment & Plan Note (Signed)
Episodic spells... EEG neg for seizure.

## 2011-06-12 NOTE — Assessment & Plan Note (Signed)
Chornic leg pain is more body pain in general.. Likely fibromyalgia.  On knee eval by Dr. August Saucer she has had neg Lyme titer and RF. She is now open to cymblata which may help with her multiple complaints, but we will try to stop statin to see if this will he;p first. Follow up in 2 weeks.

## 2011-06-12 NOTE — Patient Instructions (Addendum)
Stop simvastatin for 2 weeks...to see if leg pain/other body improves. Follow up in 2 weeks.. 30 min if available.

## 2011-06-12 NOTE — Assessment & Plan Note (Signed)
Neuropshyc testing did show normal cognition, but mood issues. She would likely benefit from Cymbalta. We will consider starting this at next OV.

## 2011-06-12 NOTE — Progress Notes (Signed)
Subjective:    Patient ID: Mia Kline, female    DOB: [birth year], 55 y.o.   MRN: [id number]  HPI  55 year old female with multiple issues presents for follow up.  Recently seen for sholder sprain by Dr. Alphonsus Sias 05/2011.  For memory loss, unusual headhache and multiple unusual neuro complaints.. She was referred to Dr. Modesto Charon who she saw 03/2011.  His impression and recommendations are below:   Mia Kline is a 55 year old female with a history of depression who presents with multiple somatic complaints today. Concerning her chronic headaches, I am currently not sure of the cause. However, I do believe her "bad headaches" are migrainous in nature. I have offered her sumatriptan as an abortive instead of Aleve, in order to see if it may be more effective. I've given her a prescription for Imitrex 50 mg to be used as soon as a migraine headache begins. She may repeat this after 2 hours if it is ineffective. This is lower than my typical dose of 100mg  as she is worried about the Imitrex precipitating anxiety attacks. If however, a single dose of 50mg  is ineffective then we can increase this to 100mg  if tolerated, with another 100mg  in 2 hours if the headache is not aborted.   The short spells that she describes are very nonspecific.. It is possible given the paroxysmal nature that these represent simple partial seizures although I think this is very unlikely. I am going to get a routine EEG however. Given how frequently they occur there is a chance that they might occur during the EEG itself.  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 I am going to refer her to [facility] neuropsychology for testing. I would normally refer her to Dr. Eula Flax at [facility] but he is going to be out on sick leave so we will send her to [facility]. This NP testing will most importantly give [place] a baseline to follow and  help [place] rule out any organic cause to her memory complaints.  Per the pt shoulder pain has improved.  She does report that she continues to have "spells of dizziness" Imitrex has not helped at 50 mg dose for headaches.. She still has to use aleve which helps more. Still occuring 1-2 times a week. She had neuropshycology testing: it  suggested she ishas more mood complaints that are contributing to cognitive complaints... But normal average testing in other areas. 03/30/2011 EEG: normal   Continued foot and ankle pain. Occ off and on pain in arms. X-rays of feet normal per Dr. August Saucer. In past she was on cymblata... A previous neuro suggested she may have fibromyalgia. She is open to tring this but would like to minimize other meds if she can.  Dr. August Saucer... Currently recommending surgery for knee issues:arthoroscopic surgery  misalignment on right knee, possible total knee on left for arthritis. Using tramadol at night for knee pain, off and on using cyclobenzaprine.    Review of Systems  Constitutional: Negative for fever and fatigue.  HENT: Negative for ear pain.   Eyes: Negative for pain.  Respiratory: Negative for chest tightness and shortness of breath.   Cardiovascular: Negative for chest pain, palpitations and leg swelling.  Gastrointestinal: Negative for abdominal pain.  Genitourinary: Negative for dysuria.       Objective:   Physical Exam  Constitutional: Vital signs are normal. She appears well-developed and well-nourished. She is cooperative.  Non-toxic appearance. She does not appear ill. No distress.  HENT:  Head: Normocephalic.  Right Ear: Hearing, tympanic membrane, external ear and ear canal normal. Tympanic membrane is not erythematous, not retracted and not bulging.  Left Ear: Hearing, tympanic membrane, external ear and ear canal normal. Tympanic membrane is not erythematous, not retracted and not bulging.  Nose: No mucosal edema or rhinorrhea. Right sinus exhibits no  maxillary sinus tenderness and no frontal sinus tenderness. Left sinus exhibits no maxillary sinus tenderness and no frontal sinus tenderness.  Mouth/Throat: Uvula is midline, oropharynx is clear and moist and mucous membranes are normal.  Eyes: Conjunctivae, EOM and lids are normal. Pupils are equal, round, and reactive to light. No foreign bodies found.  Neck: Trachea normal and normal range of motion. Neck supple. Carotid bruit is not present. No mass and no thyromegaly present.  Cardiovascular: Normal rate, regular rhythm, S1 normal, S2 normal, normal heart sounds, intact distal pulses and normal pulses.  Exam reveals no gallop and no friction rub.   No murmur heard. Pulmonary/Chest: Effort normal and breath sounds normal. Not tachypneic. No respiratory distress. She has no decreased breath sounds. She has no wheezes. She has no rhonchi. She has no rales.  Abdominal: Soft. Normal appearance and bowel sounds are normal. There is no tenderness.  Musculoskeletal:       12 positive trigger points for fibromyalgia... Neg control points  Neurological: She is alert.  Skin: Skin is warm, dry and intact. No rash noted.  Psychiatric: Her speech is normal. Judgment and thought content normal. Her mood appears not anxious. Her affect is blunt. She is slowed and withdrawn. Cognition and memory are normal. She exhibits a depressed mood.          Assessment & Plan:

## 2011-06-12 NOTE — Assessment & Plan Note (Signed)
Likely migraines per Dr. Modesto Charon... No improvement with 50 mg imitrex. Aleve does help... She is alos on celebrex, so she may have some element of rebound headache. Consider trial of higher dose imitrex as Dr. Modesto Charon recommended.

## 2011-06-26 ENCOUNTER — Ambulatory Visit (INDEPENDENT_AMBULATORY_CARE_PROVIDER_SITE_OTHER): Payer: 59 | Admitting: Family Medicine

## 2011-06-26 ENCOUNTER — Encounter: Payer: Self-pay | Admitting: Family Medicine

## 2011-06-26 DIAGNOSIS — IMO0001 Reserved for inherently not codable concepts without codable children: Secondary | ICD-10-CM

## 2011-06-26 DIAGNOSIS — F3289 Other specified depressive episodes: Secondary | ICD-10-CM

## 2011-06-26 DIAGNOSIS — M797 Fibromyalgia: Secondary | ICD-10-CM | POA: Insufficient documentation

## 2011-06-26 DIAGNOSIS — F329 Major depressive disorder, single episode, unspecified: Secondary | ICD-10-CM

## 2011-06-26 HISTORY — DX: Fibromyalgia: M79.7

## 2011-06-26 NOTE — Progress Notes (Signed)
Subjective:    Patient ID: Mia Kline, female    DOB: Oct 09, 1955, 55 y.o.   MRN: [id number]  HPI 2 week follow up on multiple medical issues.  Summary of recommendations from last OV: Leg pain - Kerby Nora, MD 06/12/2011 9:01 AM Signed  Chornic leg pain is more body pain in general.. Likely fibromyalgia.  On knee eval by Dr. August Saucer she has had neg Lyme titer and RF.  She is now open to cymblata which may help with her multiple complaints, but we will try to stop statin to see if this will help first. Follow up in 2 weeks.  HEADACHE - Kerby Nora, MD 06/12/2011 9:01 AM Signed  Likely migraines per Dr. Modesto Charon... No improvement with 50 mg imitrex. Aleve does help... She is alos on celebrex, so she may have some element of rebound headache. Consider trial of higher dose imitrex as Dr. Modesto Charon recommended. DIZZINESS - Kerby Nora, MD 06/12/2011 9:02 AM Signed  Episodic spells... EEG neg for seizure.  DEPRESSION - Kerby Nora, MD 06/12/2011 9:03 AM Signed  Neuropshyc testing did show normal cognition, but mood issues. She would likely benefit from Cymbalta. We will consider starting this at next OV.      Today she reports after being off simvastatin for 2 weeks... No change in leg pain or any other symptoms.     Review of Systems  Constitutional: Positive for fatigue. Negative for fever.  HENT: Negative for ear pain.   Eyes: Negative for pain.  Respiratory: Negative for shortness of breath.   Cardiovascular: Negative for chest pain.  Musculoskeletal: Positive for myalgias.  Neurological: Positive for dizziness, numbness and headaches.       Objective:   Physical Exam  Constitutional: Vital signs are normal. She appears well-developed and well-nourished. She is cooperative.  Non-toxic appearance. She does not appear ill. No distress.  HENT:  Head: Normocephalic.  Right Ear: Hearing, tympanic membrane, external ear and ear canal normal. Tympanic membrane is not erythematous, not  retracted and not bulging.  Left Ear: Hearing, tympanic membrane, external ear and ear canal normal. Tympanic membrane is not erythematous, not retracted and not bulging.  Nose: No mucosal edema or rhinorrhea. Right sinus exhibits no maxillary sinus tenderness and no frontal sinus tenderness. Left sinus exhibits no maxillary sinus tenderness and no frontal sinus tenderness.  Mouth/Throat: Uvula is midline, oropharynx is clear and moist and mucous membranes are normal.  Eyes: Conjunctivae, EOM and lids are normal. Pupils are equal, round, and reactive to light. No foreign bodies found.  Neck: Trachea normal and normal range of motion. Neck supple. Carotid bruit is not present. No mass and no thyromegaly present.  Cardiovascular: Normal rate, regular rhythm, S1 normal, S2 normal, normal heart sounds, intact distal pulses and normal pulses.  Exam reveals no gallop and no friction rub.   No murmur heard. Pulmonary/Chest: Effort normal and breath sounds normal. Not tachypneic. No respiratory distress. She has no decreased breath sounds. She has no wheezes. She has no rhonchi. She has no rales.  Abdominal: Soft. Normal appearance and bowel sounds are normal. There is no tenderness.  Musculoskeletal:       12 positive trigger points for fibromyalgia... Neg control points  Neurological: She is alert.  Skin: Skin is warm, dry and intact. No rash noted.  Psychiatric: Her speech is normal. Judgment and thought content normal. Her mood appears not anxious. Her affect is blunt. She is slowed and withdrawn. Cognition and memory are normal. She exhibits a  depressed mood.          Assessment & Plan:

## 2011-06-26 NOTE — Assessment & Plan Note (Signed)
Expect possible benefit from treatment with cymbalta.

## 2011-06-26 NOTE — Patient Instructions (Addendum)
Restart simvastatin. Start trial of cymbalta... Take 30 mg daily for 1 week, then if tolerating increase to 60 mg mg daily. Follow up appt in 5 weeks please 30 min OV.

## 2011-06-26 NOTE — Assessment & Plan Note (Signed)
Trial of cymbalta. Discussed benifts and SE, how to take med and expected response. Follow up in 5 weeks.

## 2011-07-14 ENCOUNTER — Other Ambulatory Visit: Payer: Self-pay | Admitting: Internal Medicine

## 2011-07-14 NOTE — Telephone Encounter (Signed)
Requests a refill on Valtrex.

## 2011-07-16 NOTE — Telephone Encounter (Signed)
Please call pt to clarify.. Is she taking this med daily for prevention or is she having a flare that needs to be treated? Okay to re-fill appropriately.

## 2011-07-17 MED ORDER — VALACYCLOVIR HCL 500 MG PO TABS: 500.0000 mg | ORAL_TABLET | Freq: Every day | ORAL | Status: DC

## 2011-07-31 ENCOUNTER — Ambulatory Visit (INDEPENDENT_AMBULATORY_CARE_PROVIDER_SITE_OTHER): Payer: 59 | Admitting: Family Medicine

## 2011-07-31 ENCOUNTER — Encounter: Payer: Self-pay | Admitting: Family Medicine

## 2011-07-31 ENCOUNTER — Ambulatory Visit: Payer: 59 | Admitting: Family Medicine

## 2011-07-31 DIAGNOSIS — R42 Dizziness and giddiness: Secondary | ICD-10-CM

## 2011-07-31 DIAGNOSIS — IMO0001 Reserved for inherently not codable concepts without codable children: Secondary | ICD-10-CM

## 2011-07-31 DIAGNOSIS — R7989 Other specified abnormal findings of blood chemistry: Secondary | ICD-10-CM

## 2011-07-31 DIAGNOSIS — M79609 Pain in unspecified limb: Secondary | ICD-10-CM

## 2011-07-31 DIAGNOSIS — F329 Major depressive disorder, single episode, unspecified: Secondary | ICD-10-CM

## 2011-07-31 DIAGNOSIS — M797 Fibromyalgia: Secondary | ICD-10-CM

## 2011-07-31 DIAGNOSIS — F3289 Other specified depressive episodes: Secondary | ICD-10-CM

## 2011-07-31 DIAGNOSIS — M79606 Pain in leg, unspecified: Secondary | ICD-10-CM

## 2011-07-31 MED ORDER — DULOXETINE HCL 60 MG PO CPEP: 60.0000 mg | ORAL_CAPSULE | Freq: Every day | ORAL | Status: DC

## 2011-07-31 NOTE — Assessment & Plan Note (Signed)
Knee pain improved with cymbalta and celebrex.

## 2011-07-31 NOTE — Assessment & Plan Note (Signed)
Decrease in episodes on cymbalta... ? Due to improved mood control?

## 2011-07-31 NOTE — Assessment & Plan Note (Signed)
Re-eval at next lab check.

## 2011-07-31 NOTE — Patient Instructions (Signed)
Prescriptions sent to [facility] and [facility] for Cymbalta.  Continue cymbalta 60 mg daily for now.  Follow up CPX with fasting labs prior in 11/2011... earlier  For other issues if needed.

## 2011-07-31 NOTE — Assessment & Plan Note (Signed)
Mild improvement on cymbalta so far. Encouraged healthy eating and weight loss.

## 2011-07-31 NOTE — Progress Notes (Signed)
Subjective:    Patient ID: Mia Kline, female    DOB: 1956-05-10, 55 y.o.   MRN: [id number]  HPI  55 year old female presents for fibromyalgia/depression follow up. She has bene on cymbalta for 5 weeks now , 4 of which on 60 mg daily. Neuropshyc testing did show normal cognition, but mood issues.   Leg pain and body pain: She has noted only mild improvement in symptoms. She state still continued fatigue.  Has been out of celebrex... So knee pain returned, but now she has   Headache, posterior: intermittant improved some in last month Used imitrex, using aleve prn. Likely migraines per Dr. Modesto Charon. HAs no scheuled follow up at this time.  Dizziness -Episodic spells... EEG neg for seizure.  HAd some improvement in spells until lasst week when she had one episode in shower.       Review of Systems  Constitutional: Positive for fatigue. Negative for fever.  HENT: Negative for ear pain and nosebleeds.        Has noted swelling behing right ear this morning  Eyes: Negative for pain.  Respiratory:       Occ intermittant  cough  Genitourinary: Negative for dysuria.  Musculoskeletal: Positive for arthralgias. Negative for back pain.  Neurological: Negative for syncope, weakness and numbness.  Psychiatric/Behavioral: Negative for behavioral problems.       Objective:   Physical Exam  Constitutional: Vital signs are normal. She appears well-developed and well-nourished. She is cooperative.  Non-toxic appearance. She does not appear ill. No distress.  HENT:  Head: Normocephalic.  Right Ear: Hearing, tympanic membrane, external ear and ear canal normal. Tympanic membrane is not erythematous, not retracted and not bulging.  Left Ear: Hearing, tympanic membrane, external ear and ear canal normal. Tympanic membrane is not erythematous, not retracted and not bulging.  Nose: No mucosal edema or rhinorrhea. Right sinus exhibits no maxillary sinus tenderness and no frontal sinus  tenderness. Left sinus exhibits no maxillary sinus tenderness and no frontal sinus tenderness.  Mouth/Throat: Uvula is midline, oropharynx is clear and moist and mucous membranes are normal.  Eyes: Conjunctivae, EOM and lids are normal. Pupils are equal, round, and reactive to light. No foreign bodies found.  Neck: Trachea normal and normal range of motion. Neck supple. Carotid bruit is not present. No mass and no thyromegaly present.  Cardiovascular: Normal rate, regular rhythm, S1 normal, S2 normal, normal heart sounds, intact distal pulses and normal pulses.  Exam reveals no gallop and no friction rub.   No murmur heard. Pulmonary/Chest: Effort normal and breath sounds normal. Not tachypneic. No respiratory distress. She has no decreased breath sounds. She has no wheezes. She has no rhonchi. She has no rales.  Abdominal: Soft. Normal appearance and bowel sounds are normal. There is no tenderness.  Lymphadenopathy:       Head (right side): No submental, no submandibular, no tonsillar, no preauricular, no posterior auricular and no occipital adenopathy present.       Head (left side): No submental, no submandibular, no tonsillar, no preauricular, no posterior auricular and no occipital adenopathy present.    She has no cervical adenopathy.       Right cervical: No superficial cervical, no deep cervical and no posterior cervical adenopathy present.      Left cervical: No superficial cervical, no deep cervical and no posterior cervical adenopathy present.       Pt points to swelling preauricular on right but nothing palpated.  Neurological: She is alert.  Skin: Skin is warm, dry and intact. No rash noted.  Psychiatric: Her speech is normal. Judgment and thought content normal. Her mood appears not anxious. Her affect is blunt. She is slowed and withdrawn. She is not agitated. Cognition and memory are normal. She exhibits a depressed mood. She expresses no suicidal ideation. She expresses no suicidal  plans.          Assessment & Plan:

## 2011-07-31 NOTE — Assessment & Plan Note (Signed)
Minimal change on cymbalta, but give more time as seems to be helping some in other areas.

## 2011-08-02 ENCOUNTER — Other Ambulatory Visit: Payer: Self-pay | Admitting: Internal Medicine

## 2011-08-02 MED ORDER — HYDROCHLOROTHIAZIDE 25 MG PO TABS: 25.0000 mg | ORAL_TABLET | Freq: Every day | ORAL | Status: DC

## 2011-08-02 MED ORDER — OMEPRAZOLE 40 MG PO CPDR: 40.0000 mg | DELAYED_RELEASE_CAPSULE | Freq: Every day | ORAL | Status: DC

## 2011-08-02 NOTE — Telephone Encounter (Signed)
Faxed Rx to pharmacy  

## 2011-08-22 ENCOUNTER — Other Ambulatory Visit: Payer: Self-pay | Admitting: *Deleted

## 2011-08-22 MED ORDER — DULOXETINE HCL 60 MG PO CPEP: 60.0000 mg | ORAL_CAPSULE | Freq: Every day | ORAL | Status: DC

## 2011-08-22 NOTE — Telephone Encounter (Signed)
[facility]

## 2011-09-28 ENCOUNTER — Other Ambulatory Visit: Payer: Self-pay | Admitting: *Deleted

## 2011-09-28 MED ORDER — VALACYCLOVIR HCL 500 MG PO TABS: 500.0000 mg | ORAL_TABLET | Freq: Every day | ORAL | Status: DC

## 2011-11-15 IMAGING — CR DG KNEE 1-2V*L*
3 series · 3 of 3 positions shown · non-contrast
Comparison: None

CLINICAL DATA: Fell.  Left knee pain.

LEFT KNEE - 1-2 VIEW

[view not recorded (1 of 3)]
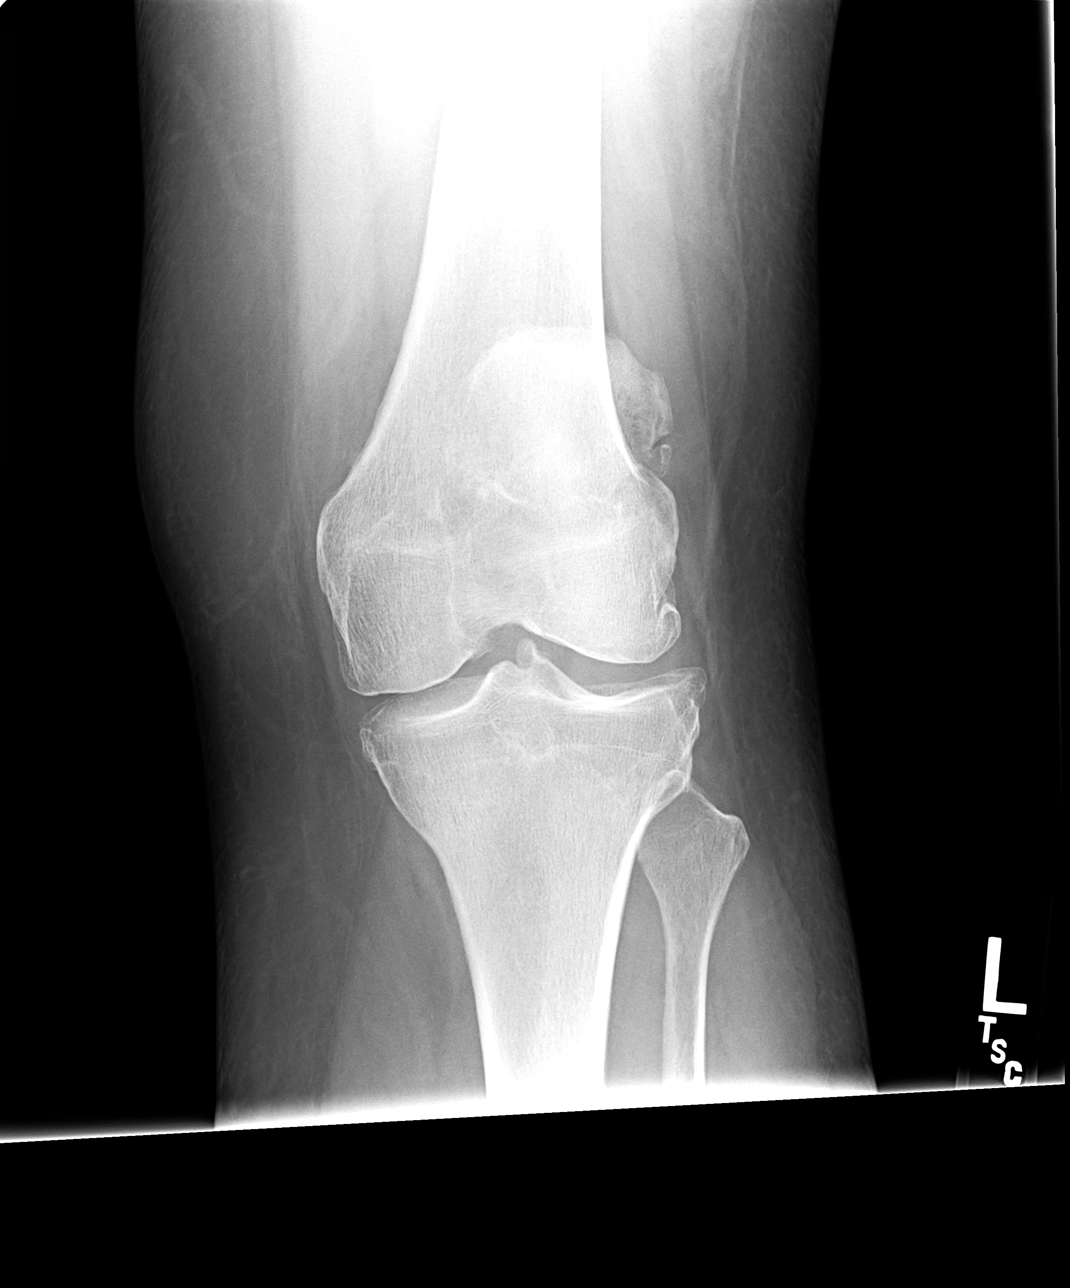

[view not recorded (2 of 3)]
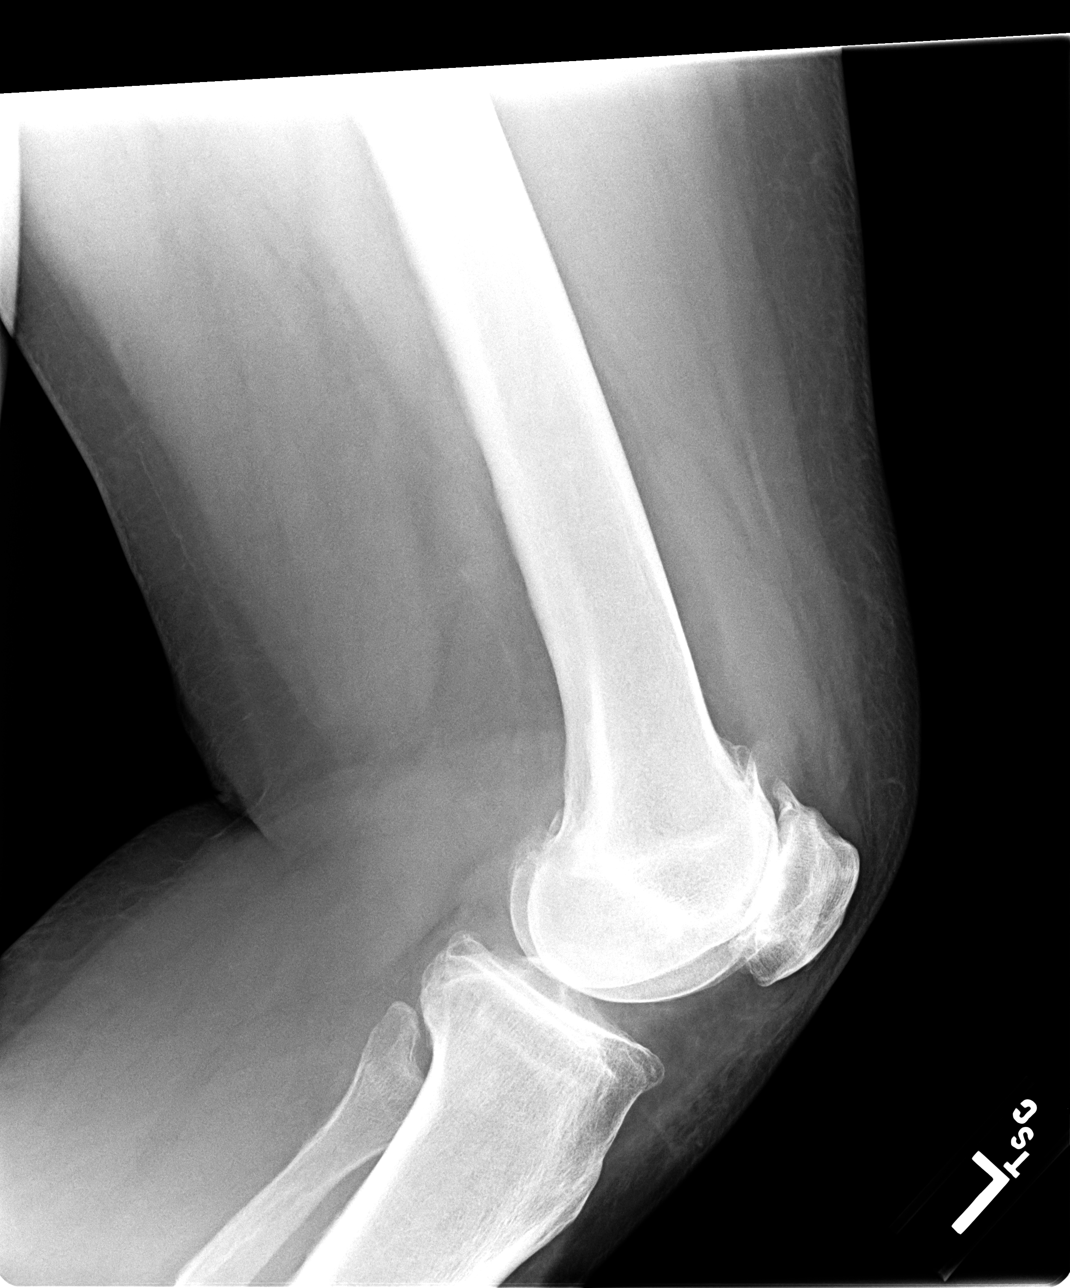

[view not recorded (3 of 3)]
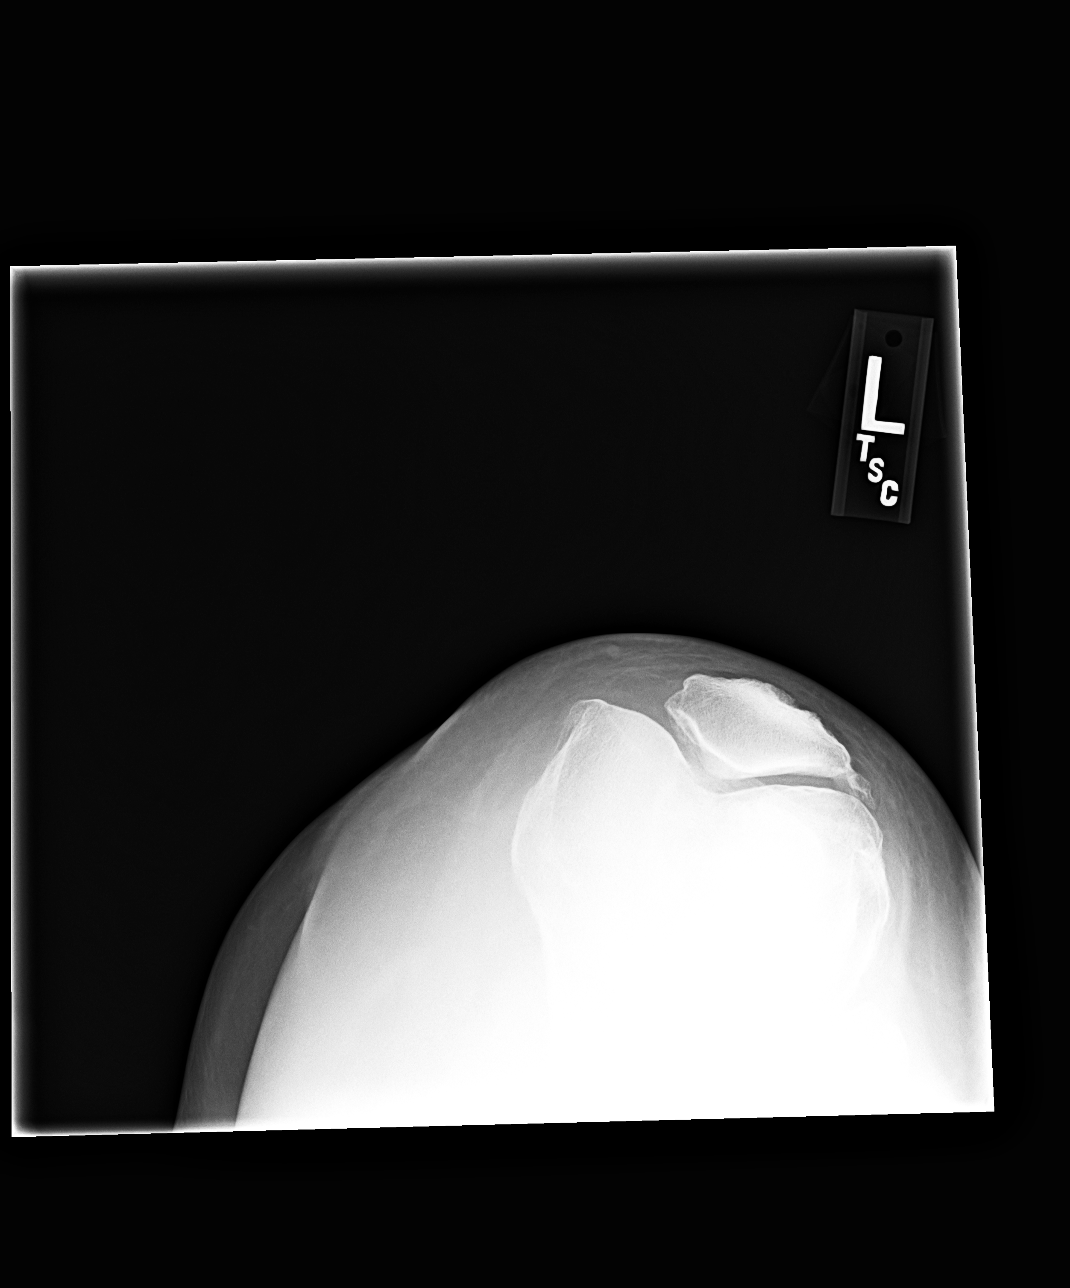

[3 of 3 positions shown; findings below may reference images not displayed]

FINDINGS: There are tricompartmental degenerative changes with
joint space narrowing and osteophytic spurring.  No definite acute
fracture.    Although I do not see a definite fracture I am
somewhat suspicious of a mild lateral tibial plateau depression
fracture.  There is a moderate sized joint effusion.  CT scan may
be helpful for further evaluation.
IMPRESSION: Possible lateral tibial plateau depression type fracture.
Recommend CT for further evaluation.

## 2011-12-25 ENCOUNTER — Telehealth: Payer: Self-pay | Admitting: Family Medicine

## 2011-12-25 DIAGNOSIS — D509 Iron deficiency anemia, unspecified: Secondary | ICD-10-CM

## 2011-12-25 DIAGNOSIS — E785 Hyperlipidemia, unspecified: Secondary | ICD-10-CM

## 2011-12-25 DIAGNOSIS — R7989 Other specified abnormal findings of blood chemistry: Secondary | ICD-10-CM

## 2011-12-25 NOTE — Telephone Encounter (Signed)
Message copied by Excell Seltzer on Mon Dec 25, 2011  1:09 AM ------      Message from: Alvina Chou      Created: Wed Dec 20, 2011 10:53 AM      Regarding: labs for Tues 5-28       Patient is scheduled for CPX labs, please order future labs, Thanks , Camelia Eng

## 2011-12-26 ENCOUNTER — Other Ambulatory Visit (INDEPENDENT_AMBULATORY_CARE_PROVIDER_SITE_OTHER): Payer: 59

## 2011-12-26 DIAGNOSIS — E785 Hyperlipidemia, unspecified: Secondary | ICD-10-CM

## 2011-12-26 DIAGNOSIS — D509 Iron deficiency anemia, unspecified: Secondary | ICD-10-CM

## 2011-12-26 DIAGNOSIS — R7989 Other specified abnormal findings of blood chemistry: Secondary | ICD-10-CM

## 2011-12-26 LAB — COMPREHENSIVE METABOLIC PANEL
AST: 37 U/L (ref 0–37)
Albumin: 3.9 g/dL (ref 3.5–5.2)
BUN: 15 mg/dL (ref 6–23)
Calcium: 9.1 mg/dL (ref 8.4–10.5)
Chloride: 103 mEq/L (ref 96–112)
Creatinine, Ser: 0.9 mg/dL (ref 0.4–1.2)
GFR: 71.57 mL/min (ref >60.00)
Glucose, Bld: 91 mg/dL (ref 70–99)

## 2011-12-26 LAB — CBC WITH DIFFERENTIAL/PLATELET
Basophils Absolute: 0 10*3/uL (ref 0.0–0.1)
Eosinophils Absolute: 0.2 10*3/uL (ref 0.0–0.7)
HCT: 37.5 % (ref 36.0–46.0)
Hemoglobin: 12 g/dL (ref 12.0–15.0)
Lymphs Abs: 2 10*3/uL (ref 0.7–4.0)
MCHC: 32.2 g/dL (ref 30.0–36.0)
MCV: 80.2 fl (ref 78.0–100.0)
Monocytes Absolute: 0.5 10*3/uL (ref 0.1–1.0)
Neutro Abs: 3.7 10*3/uL (ref 1.4–7.7)
Platelets: 208 10*3/uL (ref 150.0–400.0)
RDW: 18.1 % — ABNORMAL HIGH (ref 11.5–14.6)

## 2011-12-26 LAB — LIPID PANEL
Cholesterol: 192 mg/dL (ref 0–200)
HDL: 69.7 mg/dL (ref >39.00)
Total CHOL/HDL Ratio: 3
Triglycerides: 116 mg/dL (ref 0.0–149.0)

## 2012-01-02 ENCOUNTER — Encounter: Payer: Self-pay | Admitting: Family Medicine

## 2012-01-02 ENCOUNTER — Ambulatory Visit (INDEPENDENT_AMBULATORY_CARE_PROVIDER_SITE_OTHER): Payer: 59 | Admitting: Family Medicine

## 2012-01-02 VITALS — BP 120/80 | HR 60 | Temp 98.1°F | Ht 67.5 in | Wt 241.0 lb

## 2012-01-02 DIAGNOSIS — F329 Major depressive disorder, single episode, unspecified: Secondary | ICD-10-CM

## 2012-01-02 DIAGNOSIS — R05 Cough: Secondary | ICD-10-CM

## 2012-01-02 DIAGNOSIS — I10 Essential (primary) hypertension: Secondary | ICD-10-CM

## 2012-01-02 DIAGNOSIS — M797 Fibromyalgia: Secondary | ICD-10-CM

## 2012-01-02 DIAGNOSIS — R059 Cough, unspecified: Secondary | ICD-10-CM

## 2012-01-02 DIAGNOSIS — N898 Other specified noninflammatory disorders of vagina: Secondary | ICD-10-CM | POA: Insufficient documentation

## 2012-01-02 DIAGNOSIS — R0981 Nasal congestion: Secondary | ICD-10-CM

## 2012-01-02 DIAGNOSIS — Z0000 Encounter for general adult medical examination without abnormal findings: Secondary | ICD-10-CM

## 2012-01-02 DIAGNOSIS — M79606 Pain in leg, unspecified: Secondary | ICD-10-CM

## 2012-01-02 DIAGNOSIS — J3489 Other specified disorders of nose and nasal sinuses: Secondary | ICD-10-CM

## 2012-01-02 DIAGNOSIS — D649 Anemia, unspecified: Secondary | ICD-10-CM

## 2012-01-02 DIAGNOSIS — E785 Hyperlipidemia, unspecified: Secondary | ICD-10-CM

## 2012-01-02 DIAGNOSIS — Z1231 Encounter for screening mammogram for malignant neoplasm of breast: Secondary | ICD-10-CM

## 2012-01-02 DIAGNOSIS — R51 Headache: Secondary | ICD-10-CM

## 2012-01-02 DIAGNOSIS — T17908A Unspecified foreign body in respiratory tract, part unspecified causing other injury, initial encounter: Secondary | ICD-10-CM

## 2012-01-02 LAB — POCT WET PREP (WET MOUNT): KOH Wet Prep POC: NEGATIVE

## 2012-01-02 MED ORDER — FLUTICASONE PROPIONATE 50 MCG/ACT NA SUSP: 2.0000 | Freq: Every day | NASAL | Status: DC

## 2012-01-02 NOTE — Progress Notes (Signed)
Subjective:    PatientID:Mia Kline,femaleDOB:Dec 18, 1955,56 y.o.ZOX:[id number]  HPI Thepatientishereforannualwellnessexamandpreventativecare.  Hypertension:WellcontrolledonHCTZ.   Usingmedicationwithoutproblemsorlightheadedness:None Chestpainwithexertion:None Edema:None Shortofbreath:Noneatrest,somewithworkingout. AveragehomeBPs:Notchecking. Otherissues:Lasttimeshewasworkingoutshehadpalpitations(skipsabeat),numballoversimilartoin2011(hadnmlexercsietreadmillstresstest).LastEKGnml8/2013.  ElevatedCholesterol:LDLatgoalonzocor20mg  Lab Results  Component Value Date   CHOL 192 12/26/2011   HDL 69.70 12/26/2011   LDLCALC 99 12/26/2011   LDLDIRECT 160.7 09/14/2008   TRIG 116.0 12/26/2011   CHOLHDL 3 12/26/2011  Usingmedicationswithoutproblems:None Muscleaches:Trialoffstatindidnotimprovelegpain. Dietcompliance:eatinghealthy. Exercise:walking48milesatatime. Othercomplaints:  Depression,fibromyalgia:legandbodypain:Oncymbalta60mg daily,shestoopedforawhileandpainreturnedgreatly. Flexerilprn. Wasfeelingwellwhenshewasexercising.  Headache,posterior:intermittantimprovedsomeUsedimitrex,usingaleveprn. LikelymigrainesperDr.Wong(neuro).HAsnoscheduledfollowupatthistime.  Dizziness-Episodicspells...EEGnegforseizure.  FeelingoffullnessinsinusesInmornings,painbehindrightear. PND,occtickleinthroatcausingcough.  Occfeelslikeshe aspirates with swallowing liquids  Vaginal discharge continues, no itching.    Review of Systems  Constitutional: Positive for fatigue. Negative for fever.  Respiratory: Negative for shortness of breath.   Cardiovascular: Positive for palpitations. Negative for chest pain and leg swelling.  Gastrointestinal: Negative for abdominal pain.  Genitourinary: Negative for dysuria.  Musculoskeletal: Negative for back pain.  Neurological: Negative for numbness.   See HPI.   Objective:   Physical Exam  Constitutional: Vital signs are normal. She appears well-developed and  well-nourished. She is cooperative.  Non-toxic appearance. She does not appear ill. No distress.       obese  HENT:  Head: Normocephalic.  Right Ear: Hearing, tympanic membrane, external ear and ear canal normal. Tympanic membrane is not erythematous, not retracted and not bulging.  Left Ear: Hearing, tympanic membrane, external ear and ear canal normal. Tympanic membrane is not erythematous, not retracted and not bulging.  Nose: No mucosal edema or rhinorrhea. Right sinus exhibits no maxillary sinus tenderness and no frontal sinus tenderness. Left sinus exhibits no maxillary sinus tenderness and no frontal sinus tenderness.  Mouth/Throat: Uvula is midline, oropharynx is clear and moist and mucous membranes are normal.  Eyes: Conjunctivae, EOM and lids are normal. Pupils are equal, round, and reactive to light. No foreign bodies found.  Neck: Trachea normal and normal range of motion. Neck supple. Carotid bruit is not present. No mass and no thyromegaly present.  Cardiovascular: Normal rate, regular rhythm, S1 normal, S2 normal, normal heart sounds, intact distal pulses and normal pulses.  Exam reveals no gallop and no friction rub.   No murmur heard. Pulmonary/Chest: Effort normal and breath sounds normal. Not tachypneic. No respiratory distress. She has no decreased breath sounds. She has no wheezes. She has no rhonchi. She has no rales.  Abdominal: Soft. Normal appearance and bowel sounds are normal. There is no tenderness.  Lymphadenopathy:       Head (right side): No submental, no submandibular, no tonsillar, no preauricular, no posterior auricular and no occipital adenopathy present.       Head (left side): No submental, no submandibular, no tonsillar, no preauricular, no posterior auricular and no occipital adenopathy present.    She has no cervical adenopathy.       Right cervical: No superficial cervical, no deep cervical and no posterior cervical adenopathy present.      Left cervical:  No superficial cervical, no deep cervical and no posterior cervical adenopathy present.  Neurological: She is alert.  Skin: Skin is warm, dry and intact. No rash noted.  Psychiatric: Her speech is normal. Judgment and thought content normal. Her mood appears not anxious. Her affect is not blunt. She is not agitated, not slowed and not withdrawn. Cognition and memory are normal. She does not exhibit a depressed mood. She expresses no suicidal ideation. She expresses no suicidal plans.       She is more talkative and smiles more than at previous appts. Less flat affect.         Assessment&Plan:  Thepatient'spreventativemaintenanceandrecommendedscreeningtestsforanannualwellnessexamwerereviewedinfulltoday.  Broughtuptodateunlessservicesdeclined.  Counselledontheimportanceofdiet,exercise,anditsroleinoverallhealthandmortality. Thepatient'sFHandSHwasreviewed,includingtheirhomelife,tobaccostatus,anddrugandalcoholstatus.  Partial hysterectomy,no BSO no pap neeeded,noDVE. Due for mammogram after 02/2012,mother with breast cancer history. UptoDate with vaccines. Colon cancer screening:02/25/2009 nml,rpt in 10years.

## 2012-01-02 NOTE — Patient Instructions (Addendum)
Return for labs to evaluate iron if interested. Stop at front desk to set up referral for mammo in 02/2012. Get back on track with exercise and weight loss. If swallowing issues continue.. Call for swallowing study. For nasal issues start daily nasal saline irrigation.. If not improving can start nasal steroid spray (sent prescription to [facility]) Follow up in 3 months 30 min OV for multiple issues, make appt sooner if needed.

## 2012-01-16 DIAGNOSIS — R0981 Nasal congestion: Secondary | ICD-10-CM | POA: Insufficient documentation

## 2012-01-16 NOTE — Assessment & Plan Note (Signed)
Improved with cymbalta

## 2012-01-16 NOTE — Assessment & Plan Note (Signed)
See discussion on nasal congestion.

## 2012-01-16 NOTE — Assessment & Plan Note (Signed)
No sign of yeast, BV or parasites on wet prep.

## 2012-01-16 NOTE — Assessment & Plan Note (Signed)
start daily nasal saline irrigation.. If not improving can start nasal steroid spray (sent prescription to [facility])

## 2012-01-16 NOTE — Assessment & Plan Note (Signed)
Well controlled. Continue current medication.  

## 2012-01-16 NOTE — Assessment & Plan Note (Signed)
Consider swallowing study to eval for aspiration. She has not clear reson to be aspirating.

## 2012-01-16 NOTE — Assessment & Plan Note (Signed)
At goal on lipitor. 

## 2012-02-14 ENCOUNTER — Other Ambulatory Visit: Payer: Self-pay | Admitting: Family Medicine

## 2012-03-27 ENCOUNTER — Ambulatory Visit
Admission: RE | Admit: 2012-03-27 | Discharge: 2012-03-27 | Disposition: A | Discharge status: Home / Self Care | Payer: 59 | Source: Ambulatory Visit | Attending: Family Medicine | Admitting: Family Medicine

## 2012-03-27 DIAGNOSIS — Z1231 Encounter for screening mammogram for malignant neoplasm of breast: Secondary | ICD-10-CM

## 2012-04-05 ENCOUNTER — Encounter: Payer: Self-pay | Admitting: Family Medicine

## 2012-04-05 ENCOUNTER — Ambulatory Visit (INDEPENDENT_AMBULATORY_CARE_PROVIDER_SITE_OTHER): Payer: 59 | Admitting: Family Medicine

## 2012-04-05 VITALS — BP 126/88 | HR 86 | Temp 98.0°F | Resp 16 | Wt 242.0 lb

## 2012-04-05 DIAGNOSIS — IMO0001 Reserved for inherently not codable concepts without codable children: Secondary | ICD-10-CM

## 2012-04-05 DIAGNOSIS — I10 Essential (primary) hypertension: Secondary | ICD-10-CM

## 2012-04-05 DIAGNOSIS — R0981 Nasal congestion: Secondary | ICD-10-CM

## 2012-04-05 DIAGNOSIS — J3489 Other specified disorders of nose and nasal sinuses: Secondary | ICD-10-CM

## 2012-04-05 DIAGNOSIS — M797 Fibromyalgia: Secondary | ICD-10-CM

## 2012-04-05 MED ORDER — AMITRIPTYLINE HCL 25 MG PO TABS: ORAL_TABLET | ORAL | Status: DC

## 2012-04-05 MED ORDER — DICLOFENAC SODIUM 75 MG PO TBEC: 75.0000 mg | DELAYED_RELEASE_TABLET | Freq: Two times a day (BID) | ORAL | Status: DC

## 2012-04-05 NOTE — Progress Notes (Signed)
Subjective:    Patient ID: Mia Kline, female    DOB: 1956-05-21, 56 y.o.   MRN: [id number]  HPI  56 year old female with history of chronic leg pain, fibromyalgia, depression, chronic fatigue and migraines presents for 3 month follow up.  She was doing much better in last few month with mood, chronic pain, better energy.   She has started weaning off cymbalta and celebrex... She is a lot more pain given she has had to come off given to cost of medication. She reports pain is 80 % worse taking 60 mg every other day. She is about to run out.   Going up and down stairs in the morning hurts the most... Main pain in knees, ankles and feet. Using ultram for severe pain and aleve. Has not returned to [facility] in past recommended arthroscopy at that time for arthritis.   Relafen and meloxicam made her tired. Topical voltaren helps some with pain but does not last long. Has not tried dicolfenac SE to paxil.  Neuropshyc testing did show normal cognition, but mood issues.  Chronic nasal congestion x 1 year: Flonase did not help much. No relief with nasal saline, mucinex no help.  Claritin helps if she is getting sick has never tried for this issue. Fluid in right ear.  Facial maxillary pressure chronically.  No fever    Hypertension: Well controlled on current meds. Using medication without problems or lightheadedness:  Chest pain with exertion: Edema: Short of breath: Average home BPs: Other issues:      Review of Systems  Constitutional: Negative for fever and fatigue.  HENT: Negative for ear pain.   Eyes: Negative for pain.  Respiratory: Negative for chest tightness and shortness of breath.   Cardiovascular: Negative for chest pain, palpitations and leg swelling.  Gastrointestinal: Negative for abdominal pain.  Genitourinary: Negative for dysuria.       Objective:   Physical Exam  Constitutional: Vital signs are normal. She appears well-developed and well-nourished. She  is cooperative.  Non-toxic appearance. She does not appear ill. No distress.  HENT:  Head: Normocephalic.  Right Ear: Hearing, tympanic membrane, external ear and ear canal normal. Tympanic membrane is not erythematous, not retracted and not bulging.  Left Ear: Hearing, tympanic membrane, external ear and ear canal normal. Tympanic membrane is not erythematous, not retracted and not bulging.  Nose: No mucosal edema or rhinorrhea. Right sinus exhibits no maxillary sinus tenderness and no frontal sinus tenderness. Left sinus exhibits no maxillary sinus tenderness and no frontal sinus tenderness.  Mouth/Throat: Uvula is midline, oropharynx is clear and moist and mucous membranes are normal.  Eyes: Conjunctivae normal, EOM and lids are normal. Pupils are equal, round, and reactive to light. No foreign bodies found.  Neck: Trachea normal and normal range of motion. Neck supple. Carotid bruit is not present. No mass and no thyromegaly present.  Cardiovascular: Normal rate, regular rhythm, S1 normal, S2 normal, normal heart sounds, intact distal pulses and normal pulses.  Exam reveals no gallop and no friction rub.   No murmur heard. Pulmonary/Chest: Effort normal and breath sounds normal. Not tachypneic. No respiratory distress. She has no decreased breath sounds. She has no wheezes. She has no rhonchi. She has no rales.  Abdominal: Soft. Normal appearance and bowel sounds are normal. There is no tenderness.  Musculoskeletal:       12 positive trigger points for fibromyalgia... Neg control points  Neurological: She is alert.  Skin: Skin is warm, dry and intact.  No rash noted.  Psychiatric: Her speech is normal. Judgment and thought content normal. Her mood appears not anxious. Her affect is blunt. She is slowed and withdrawn. Cognition and memory are normal. She exhibits a depressed mood.          Assessment & Plan:

## 2012-04-05 NOTE — Patient Instructions (Addendum)
Use dicolfenac for daily pain  (replaces celebrex for arthritis) and  tramadol for breakthru pain. If taking diclofenac stop aleve, topical voltaren gel.  Wean off cymbalta but  start amitriptyline low dose at night every other day then daily (this replace cymbalta). Try generic claritin (loratidine) for sinus issues. Take for  Daily at least 1-2 weeks to see if effective.  Follow up in 1 month.

## 2012-04-15 ENCOUNTER — Other Ambulatory Visit: Payer: Self-pay | Admitting: Family Medicine

## 2012-04-17 ENCOUNTER — Other Ambulatory Visit: Payer: Self-pay

## 2012-04-25 ENCOUNTER — Other Ambulatory Visit: Payer: Self-pay

## 2012-04-25 NOTE — Telephone Encounter (Signed)
Pt request refill diclofenac to [name]. Please advise.

## 2012-04-25 NOTE — Assessment & Plan Note (Signed)
Well controlled. Continue current medication.  

## 2012-04-25 NOTE — Assessment & Plan Note (Signed)
Use dicolfenac for daily pain  (replaces celebrex for arthritis) and  tramadol for breakthru pain. If taking diclofenac stop aleve, topical voltaren gel.  Wean off cymbalta but  start amitriptyline low dose at night every other day then daily (this replace cymbalta).

## 2012-04-25 NOTE — Assessment & Plan Note (Signed)
Try generic claritin (loratidine) for sinus issues. Take for  Daily at least 1-2 weeks to see if effective.

## 2012-04-26 MED ORDER — DICLOFENAC SODIUM 75 MG PO TBEC: 75.0000 mg | DELAYED_RELEASE_TABLET | Freq: Two times a day (BID) | ORAL | Status: DC

## 2012-05-07 ENCOUNTER — Ambulatory Visit: Payer: 59 | Admitting: Family Medicine

## 2012-05-13 ENCOUNTER — Other Ambulatory Visit: Payer: Self-pay | Admitting: Family Medicine

## 2012-05-31 ENCOUNTER — Other Ambulatory Visit: Payer: Self-pay | Admitting: Family Medicine

## 2012-05-31 NOTE — Telephone Encounter (Signed)
Received refill request for Voltaren. Last refilled on 04/25/12 qty 60 with 1 refill. Pt last seen on 04/05/12. Ok to refill?

## 2012-06-06 ENCOUNTER — Other Ambulatory Visit: Payer: Self-pay | Admitting: Family Medicine

## 2012-06-07 ENCOUNTER — Encounter: Payer: Self-pay | Admitting: Family Medicine

## 2012-06-07 ENCOUNTER — Ambulatory Visit (INDEPENDENT_AMBULATORY_CARE_PROVIDER_SITE_OTHER): Payer: 59 | Admitting: Family Medicine

## 2012-06-07 VITALS — BP 120/78 | HR 90 | Temp 98.4°F | Ht 67.5 in | Wt 241.4 lb

## 2012-06-07 DIAGNOSIS — M79606 Pain in leg, unspecified: Secondary | ICD-10-CM

## 2012-06-07 DIAGNOSIS — E785 Hyperlipidemia, unspecified: Secondary | ICD-10-CM

## 2012-06-07 DIAGNOSIS — M79609 Pain in unspecified limb: Secondary | ICD-10-CM

## 2012-06-07 DIAGNOSIS — IMO0001 Reserved for inherently not codable concepts without codable children: Secondary | ICD-10-CM

## 2012-06-07 DIAGNOSIS — M797 Fibromyalgia: Secondary | ICD-10-CM

## 2012-06-07 MED ORDER — GABAPENTIN 100 MG PO CAPS: ORAL_CAPSULE | ORAL | Status: DC

## 2012-06-07 NOTE — Assessment & Plan Note (Signed)
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 Celebrexa works wel but pt cannot afford. Diclofenac, meloxicam ineffective. Samples of celebrex given.

## 2012-06-07 NOTE — Progress Notes (Signed)
  Subjective:    Patient ID: Mia Kline, female    DOB: 1956-06-10, 56 y.o.   MRN: [id number]  HPI 56 year old female presents for follow up fibromyalgia, chronic fatigue.  At last OV: She had been doing much better on celebrex and cymbalta but cncnot afford. The plan below was outlined. Fibromyalgia - Kerby Nora, MD 04/25/2012 9:23 AM Signed  Use dicolfenac for daily pain (replaces celebrex for arthritis) and tramadol for breakthru pain.  If taking diclofenac stop aleve, topical voltaren gel.  Wean off cymbalta but start amitriptyline low dose at night every other day then daily (this replaces cymbalta).   Today she reports amitryptiline 50 mg at bedtime...she states it helps minimally with body pain and mood. She haas some associated confusion and sedation with it. Diclofenac twice daily..helps minimally. Tramadol for breakthrough pain helps, but puts her to sleep.  Medication cost is hig.  She wonders if she can take simvastatin every other day.  LDL goal is less than 130. In 5/.2013 LDL was 99.  Review of Systems  Constitutional: Negative for fever and fatigue.  HENT: Positive for congestion. Negative for ear pain.   Eyes: Negative for pain.  Respiratory: Negative for chest tightness and shortness of breath.   Cardiovascular: Negative for chest pain, palpitations and leg swelling.  Gastrointestinal: Negative for abdominal pain.  Genitourinary: Negative for dysuria.  Musculoskeletal: Positive for myalgias and arthralgias.       Objective:   Physical Exam  Constitutional: Vital signs are normal. She appears well-developed and well-nourished. She is cooperative.  Non-toxic appearance. She does not appear ill. No distress.  HENT:  Head: Normocephalic.  Right Ear: Hearing, tympanic membrane, external ear and ear canal normal. Tympanic membrane is not erythematous, not retracted and not bulging.  Left Ear: Hearing, tympanic membrane, external ear and ear canal normal. Tympanic  membrane is not erythematous, not retracted and not bulging.  Nose: No mucosal edema or rhinorrhea. Right sinus exhibits no maxillary sinus tenderness and no frontal sinus tenderness. Left sinus exhibits no maxillary sinus tenderness and no frontal sinus tenderness.  Mouth/Throat: Uvula is midline, oropharynx is clear and moist and mucous membranes are normal.  Eyes: Conjunctivae normal, EOM and lids are normal. Pupils are equal, round, and reactive to light. No foreign bodies found.  Neck: Trachea normal and normal range of motion. Neck supple. Carotid bruit is not present. No mass and no thyromegaly present.  Cardiovascular: Normal rate, regular rhythm, S1 normal, S2 normal, normal heart sounds, intact distal pulses and normal pulses.  Exam reveals no gallop and no friction rub.   No murmur heard. Pulmonary/Chest: Effort normal and breath sounds normal. Not tachypneic. No respiratory distress. She has no decreased breath sounds. She has no wheezes. She has no rhonchi. She has no rales.  Abdominal: Soft. Normal appearance and bowel sounds are normal. There is no tenderness.  Musculoskeletal:       12 positive trigger points for fibromyalgia... Neg control points  Neurological: She is alert.  Skin: Skin is warm, dry and intact. No rash noted.  Psychiatric: Her speech is normal. Judgment and thought content normal. Her mood appears not anxious. Her affect is blunt. She is slowed and withdrawn. Cognition and memory are normal. She exhibits a depressed mood.          Assessment & Plan:

## 2012-06-07 NOTE — Patient Instructions (Addendum)
   Stop amitriptyline, go to 1 tab daily for a few days then stop. Use dicolfenac when celebrex samples ar gone. If you can afford cymbalta, restart and let me know. Try trial of neurontin for fibromyalgia pain. Start at 100 mg daily, if tolerated increase to 2-3 tabs at bedtime or throughout the day.  Go to simvastatin every other day until gone. Stop simvastatin. Work on weight loss, low fat diet and regular exercise. Will recheck cholesterol at next CPX in 12/2011 Follow up in 1 month 30 min OV.

## 2012-06-07 NOTE — Assessment & Plan Note (Signed)
Amitryptiline did not help with paina nd caused SE. Cymbalta worked well but pt cannot afford.  Will try trial of neurontin.  Follow up in 1 month.

## 2012-06-07 NOTE — Assessment & Plan Note (Signed)
LDl goal <130. Pt wishes to reduce med if possible for cost. Will try to stop simvastatin and recheck at CPX. Encouraged exercise, weight loss, healthy eating habits.

## 2012-07-04 ENCOUNTER — Ambulatory Visit: Payer: 59 | Admitting: Family Medicine

## 2012-07-09 ENCOUNTER — Ambulatory Visit (INDEPENDENT_AMBULATORY_CARE_PROVIDER_SITE_OTHER): Payer: 59 | Admitting: Family Medicine

## 2012-07-09 ENCOUNTER — Encounter: Payer: Self-pay | Admitting: Family Medicine

## 2012-07-09 VITALS — BP 130/80 | HR 103 | Temp 98.3°F | Ht 67.5 in | Wt 244.2 lb

## 2012-07-09 DIAGNOSIS — M79609 Pain in unspecified limb: Secondary | ICD-10-CM

## 2012-07-09 DIAGNOSIS — IMO0001 Reserved for inherently not codable concepts without codable children: Secondary | ICD-10-CM

## 2012-07-09 DIAGNOSIS — M79606 Pain in leg, unspecified: Secondary | ICD-10-CM

## 2012-07-09 DIAGNOSIS — M797 Fibromyalgia: Secondary | ICD-10-CM

## 2012-07-09 NOTE — Patient Instructions (Addendum)
Use tramadol for left ankle pain. Keep planned surgery appt. If fibromyalgia pain not improving on neurontin in 1-2 months, call to stop neurontin and restart cymbalta since it will be generic by then.

## 2012-07-09 NOTE — Progress Notes (Signed)
  Subjective:    Patient ID: Mia Kline, female    DOB: [birth year], 56 y.o.   MRN: [id number]  HPI 56 year old female with fibromyalgia and leg pain, knee arthritis.  At last OV:  Fibromyalgia - Kerby Nora, MD 06/07/2012 4:23 PM Signed  Amitryptiline did not help with pain and caused SE. Cymbalta worked well but pt cannot afford.  Will try trial of neurontin. Follow up in 1 month.  Leg pain - Kerby Nora, MD 06/07/2012 4:24 PM Signed  Mainly due to knee arthritis. Celebrex works well but pt cannot afford. Diclofenac, meloxicam ineffective. Samples of celebrex given.  Today pt reports that trial of neurontin has not helped much. She is taking three tablets a day.  She states that she has been having pain in left ankle. Saw Dr. 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 Wearing left leg brace. This is causing more pain than anything else at this time.  Will have left knee  arthroscopic surgery on 12/30 for chronic knee pain and OA.      Review of Systems  Constitutional: Negative for fever and fatigue.  HENT: Negative for ear pain.   Eyes: Negative for pain.  Respiratory: Negative for chest tightness and shortness of breath.   Cardiovascular: Negative for chest pain, palpitations and leg swelling.  Gastrointestinal: Negative for abdominal pain.  Genitourinary: Negative for dysuria.  Musculoskeletal: Positive for myalgias and arthralgias.       Objective:   Physical Exam  Constitutional: Vital signs are normal. She appears well-developed and well-nourished. She is cooperative.  Non-toxic appearance. She does not appear ill. No distress.  HENT:  Head: Normocephalic.  Right Ear: Hearing, tympanic membrane, external ear and ear canal normal. Tympanic membrane is not erythematous, not retracted and not bulging.  Left Ear: Hearing, tympanic membrane, external ear and ear canal normal. Tympanic membrane is not erythematous, not retracted and not bulging.  Nose: No mucosal edema  or rhinorrhea. Right sinus exhibits no maxillary sinus tenderness and no frontal sinus tenderness. Left sinus exhibits no maxillary sinus tenderness and no frontal sinus tenderness.  Mouth/Throat: Uvula is midline, oropharynx is clear and moist and mucous membranes are normal.  Eyes: Conjunctivae normal, EOM and lids are normal. Pupils are equal, round, and reactive to light. No foreign bodies found.  Neck: Trachea normal and normal range of motion. Neck supple. Carotid bruit is not present. No mass and no thyromegaly present.  Cardiovascular: Normal rate, regular rhythm, S1 normal, S2 normal, normal heart sounds, intact distal pulses and normal pulses.  Exam reveals no gallop and no friction rub.   No murmur heard. Pulmonary/Chest: Effort normal and breath sounds normal. Not tachypneic. No respiratory distress. She has no decreased breath sounds. She has no wheezes. She has no rhonchi. She has no rales.  Abdominal: Soft. Normal appearance and bowel sounds are normal. There is no tenderness.  Musculoskeletal:       12 positive trigger points for fibromyalgia... Neg control points  Neurological: She is alert.  Skin: Skin is warm, dry and intact. No rash noted.  Psychiatric: Her speech is normal. Judgment and thought content normal. Her mood appears not anxious. Her affect is blunt. She is slowed and withdrawn. Cognition and memory are normal. She exhibits a depressed mood.          Assessment & Plan:

## 2012-07-12 ENCOUNTER — Ambulatory Visit: Payer: 59 | Admitting: Family Medicine

## 2012-07-19 ENCOUNTER — Other Ambulatory Visit: Payer: Self-pay | Admitting: *Deleted

## 2012-07-19 MED ORDER — DICLOFENAC SODIUM 75 MG PO TBEC: DELAYED_RELEASE_TABLET | ORAL | Status: DC

## 2012-07-19 NOTE — Telephone Encounter (Signed)
Faxed refill request for 90 day supply

## 2012-07-22 ENCOUNTER — Other Ambulatory Visit: Payer: Self-pay | Admitting: *Deleted

## 2012-07-22 MED ORDER — DICLOFENAC SODIUM 75 MG PO TBEC: DELAYED_RELEASE_TABLET | ORAL | Status: DC

## 2012-07-22 NOTE — Telephone Encounter (Signed)
[facility]ed [facility] for [facility]enac.  This was sent in on Friday for #30 with 5 refills.  OK to resend as #90 with one refill?

## 2012-07-22 NOTE — Telephone Encounter (Signed)
Yes

## 2012-08-05 ENCOUNTER — Other Ambulatory Visit: Payer: Self-pay | Admitting: Family Medicine

## 2012-08-05 NOTE — Telephone Encounter (Signed)
Pt called re: refill diclofenac. Pt said [facility]; was told we either denied or did not respond. I spoke with [facility] she tried to transfer me to pharmacist who advised her to have me call [phone] to call in rx. Pt is running low on med; I called number spoke with [facility] pharmacy tech and gave diclofenac rx as instructed. Pt notified refill done and pt will call to expedite order.

## 2012-08-06 NOTE — Assessment & Plan Note (Signed)
Use tramadol for pain. See ORTHO as planned.

## 2012-08-06 NOTE — Assessment & Plan Note (Addendum)
If fibromyalgia pain not improving on neurontin in 1-2 months, call to stop neurontin and restart cymbalta since it will be generic by then.

## 2012-09-09 ENCOUNTER — Other Ambulatory Visit: Payer: Self-pay | Admitting: Family Medicine

## 2012-09-10 ENCOUNTER — Other Ambulatory Visit: Payer: Self-pay | Admitting: Family Medicine

## 2012-10-23 ENCOUNTER — Other Ambulatory Visit: Payer: Self-pay | Admitting: Family Medicine

## 2013-02-16 ENCOUNTER — Other Ambulatory Visit: Payer: Self-pay | Admitting: Family Medicine

## 2013-02-17 NOTE — Telephone Encounter (Signed)
Received refill request electronically. Last office visit 06/07/12. Is it okay to refill?

## 2013-03-24 ENCOUNTER — Telehealth: Payer: Self-pay | Admitting: Family Medicine

## 2013-03-24 DIAGNOSIS — D509 Iron deficiency anemia, unspecified: Secondary | ICD-10-CM

## 2013-03-24 DIAGNOSIS — E785 Hyperlipidemia, unspecified: Secondary | ICD-10-CM

## 2013-03-24 DIAGNOSIS — R7989 Other specified abnormal findings of blood chemistry: Secondary | ICD-10-CM

## 2013-03-24 NOTE — Telephone Encounter (Signed)
Message copied by Excell Seltzer on Mon Mar 24, 2013 10:40 PM ------      Message from: Alvina Chou      Created: Wed Mar 19, 2013  3:11 PM      Regarding: Lab orders for Tuesday, 8.26.14       Patient is scheduled for CPX labs, please order future labs, Thanks , Terri       ------

## 2013-03-25 ENCOUNTER — Other Ambulatory Visit (INDEPENDENT_AMBULATORY_CARE_PROVIDER_SITE_OTHER): Payer: 59

## 2013-03-25 DIAGNOSIS — D509 Iron deficiency anemia, unspecified: Secondary | ICD-10-CM

## 2013-03-25 DIAGNOSIS — R7989 Other specified abnormal findings of blood chemistry: Secondary | ICD-10-CM

## 2013-03-25 DIAGNOSIS — E785 Hyperlipidemia, unspecified: Secondary | ICD-10-CM

## 2013-03-25 LAB — COMPREHENSIVE METABOLIC PANEL
ALT: 30 U/L (ref 0–35)
AST: 26 U/L (ref 0–37)
Albumin: 4.1 g/dL (ref 3.5–5.2)
CO2: 31 mEq/L (ref 19–32)
Calcium: 9.1 mg/dL (ref 8.4–10.5)
Chloride: 104 mEq/L (ref 96–112)
Creatinine, Ser: 0.9 mg/dL (ref 0.4–1.2)
GFR: 73.19 mL/min (ref >60.00)
Potassium: 3.8 mEq/L (ref 3.5–5.1)
Total Protein: 6.7 g/dL (ref 6.0–8.3)

## 2013-03-25 LAB — LDL CHOLESTEROL, DIRECT: Direct LDL: 207.8 mg/dL

## 2013-03-25 LAB — CBC WITH DIFFERENTIAL/PLATELET
Basophils Relative: 0.7 % (ref 0.0–3.0)
Eosinophils Absolute: 0.1 10*3/uL (ref 0.0–0.7)
Eosinophils Relative: 2.4 % (ref 0.0–5.0)
HCT: 38 % (ref 36.0–46.0)
Hemoglobin: 12.6 g/dL (ref 12.0–15.0)
MCHC: 33.2 g/dL (ref 30.0–36.0)
MCV: 83.5 fl (ref 78.0–100.0)
Monocytes Absolute: 0.4 10*3/uL (ref 0.1–1.0)
Neutro Abs: 3.3 10*3/uL (ref 1.4–7.7)
Neutrophils Relative %: 54.6 % (ref 43.0–77.0)
RBC: 4.55 Mil/uL (ref 3.87–5.11)
WBC: 6 10*3/uL (ref 4.5–10.5)

## 2013-03-25 LAB — IBC PANEL
Saturation Ratios: 10 % — ABNORMAL LOW (ref 20.0–50.0)
Transferrin: 355.8 mg/dL (ref 212.0–360.0)

## 2013-03-25 LAB — LIPID PANEL: HDL: 48.7 mg/dL (ref >39.00)

## 2013-03-28 ENCOUNTER — Ambulatory Visit (INDEPENDENT_AMBULATORY_CARE_PROVIDER_SITE_OTHER): Payer: 59 | Admitting: Family Medicine

## 2013-03-28 ENCOUNTER — Encounter: Payer: Self-pay | Admitting: Family Medicine

## 2013-03-28 VITALS — BP 130/90 | HR 83 | Temp 98.2°F | Ht 67.72 in | Wt 218.5 lb

## 2013-03-28 DIAGNOSIS — M79671 Pain in right foot: Secondary | ICD-10-CM

## 2013-03-28 DIAGNOSIS — M722 Plantar fascial fibromatosis: Secondary | ICD-10-CM

## 2013-03-28 DIAGNOSIS — I10 Essential (primary) hypertension: Secondary | ICD-10-CM

## 2013-03-28 DIAGNOSIS — Z0000 Encounter for general adult medical examination without abnormal findings: Secondary | ICD-10-CM

## 2013-03-28 DIAGNOSIS — M79609 Pain in unspecified limb: Secondary | ICD-10-CM

## 2013-03-28 DIAGNOSIS — R300 Dysuria: Secondary | ICD-10-CM

## 2013-03-28 DIAGNOSIS — E785 Hyperlipidemia, unspecified: Secondary | ICD-10-CM

## 2013-03-28 DIAGNOSIS — N898 Other specified noninflammatory disorders of vagina: Secondary | ICD-10-CM

## 2013-03-28 DIAGNOSIS — R7989 Other specified abnormal findings of blood chemistry: Secondary | ICD-10-CM

## 2013-03-28 HISTORY — DX: Plantar fascial fibromatosis: M72.2

## 2013-03-28 LAB — POCT URINALYSIS DIPSTICK
Nitrite, UA: NEGATIVE
Urobilinogen, UA: 0.2
pH, UA: 6

## 2013-03-28 LAB — POCT UA - MICROSCOPIC ONLY
Bacteria, U Microscopic: 0
Crystals, Ur, HPF, POC: 0
Epithelial cells, urine per micros: 0
Mucus, UA: 0
RBC, urine, microscopic: 0
WBC, Ur, HPF, POC: 0

## 2013-03-28 MED ORDER — SIMVASTATIN 20 MG PO TABS: 20.0000 mg | ORAL_TABLET | Freq: Every day | ORAL | Status: DC

## 2013-03-28 NOTE — Assessment & Plan Note (Signed)
Well controlled. Continue current medication.  

## 2013-03-28 NOTE — Assessment & Plan Note (Addendum)
Neg wet prep in past... UA micro today showed: no sign of infection.  ? Secondary to vaginal irritation... Change to hypoallergenic soap, etc.

## 2013-03-28 NOTE — Patient Instructions (Addendum)
Restart simvastatin 20 mg daily. Return for labs only in 3 months.. for cholesterol check. Get back to exercising. Call to schedule mammogram on your own. Stop at front desk for referral to podiatry for heel pain.  Change to hypoallergenic soap, detergent etc. Follow up appt for 6 month follow up multiple issues 30 min OV.

## 2013-03-28 NOTE — Progress Notes (Signed)
Subjective:    Patient ID: Mia Kline, female    DOB: April 01, 1956, 57 y.o.   MRN: [id number]  HPI  The patient is here for annual wellness exam and preventative care.     She has multiple chronic health issues.  She has been having B heel pain, right currently worse than left. Worst when she wakes up in AM, when steps out of bed. She has been stretching, heat ice, Applying voltaren gel.. Doesn't help.   Yellow creamy discharge worsening.. has been present for 12 months. Notes on underware or occ after urinating feels warmth discharge.  She notes some after urinating, occ dysuria, no incontinence.  Last year wet prep showed :  No sign of yeast, BV or parasites on wet prep.        Hypertension:  Borderline control today on HCTZ Chest pain with exertion:None Edema:None Short of breath:None Average home BPs: not checking BP Readings from Last 3 Encounters:  03/28/13 130/90  07/09/12 130/80  06/07/12 120/78  She has been trying to exercise regularly.  She has lost 30 lbs since knee surgery. Wt Readings from Last 3 Encounters:  03/28/13 218 lb 8 oz (99.111 kg)  07/09/12 244 lb 4 oz (110.791 kg)  06/07/12 241 lb 6.4 oz (109.498 kg)   Elevated Cholesterol: LDL and triglycerides elevated on no medication. Previously controlled on simvastatin. Stopped due to possible leg pain. Lab Results  Component Value Date   CHOL 296* 03/25/2013   HDL 48.70 03/25/2013   LDLCALC 99 12/26/2011   LDLDIRECT 207.8 03/25/2013   TRIG 277.0* 03/25/2013   CHOLHDL 6 03/25/2013  Diet compliance: Good Exercise: trying to get back Other complaints:   Fibromyalgia: stable control.   Review of Systems  Constitutional: Positive for fatigue. Negative for fever.  HENT: Negative for ear pain.   Eyes: Negative for pain.  Respiratory: Negative for chest tightness and shortness of breath.   Cardiovascular: Negative for chest pain, palpitations and leg swelling.  Gastrointestinal: Negative for abdominal  pain.  Genitourinary: Negative for dysuria.       Objective:   Physical Exam  Constitutional: Vital signs are normal. She appears well-developed and well-nourished. She is cooperative.  Non-toxic appearance. She does not appear ill. No distress.  HENT:  Head: Normocephalic.  Right Ear: Hearing, tympanic membrane, external ear and ear canal normal.  Left Ear: Hearing, tympanic membrane, external ear and ear canal normal.  Nose: Nose normal.  Eyes: Conjunctivae, EOM and lids are normal. Pupils are equal, round, and reactive to light. Lids are everted and swept, no foreign bodies found.  Neck: Trachea normal and normal range of motion. Neck supple. Carotid bruit is not present. No mass and no thyromegaly present.  Cardiovascular: Normal rate, regular rhythm, S1 normal, S2 normal, normal heart sounds and intact distal pulses.  Exam reveals no gallop.   No murmur heard. Pulmonary/Chest: Effort normal and breath sounds normal. No respiratory distress. She has no wheezes. She has no rhonchi. She has no rales.  Abdominal: Soft. Normal appearance and bowel sounds are normal. She exhibits no distension, no fluid wave, no abdominal bruit and no mass. There is no hepatosplenomegaly. There is no tenderness. There is no rebound, no guarding and no CVA tenderness. No hernia.  Genitourinary: No breast swelling, tenderness, discharge or bleeding. Pelvic exam was performed with patient supine.  Lymphadenopathy:    She has no cervical adenopathy.    She has no axillary adenopathy.  Neurological: She is alert. She has normal strength.  No cranial nerve deficit or sensory deficit.  Skin: Skin is warm, dry and intact. No rash noted.  Psychiatric: Judgment normal. Her mood appears not anxious. Her affect is blunt. She is slowed and withdrawn. Cognition and memory are normal. She does not exhibit a depressed mood. She expresses no suicidal plans and no homicidal plans.          Assessment & Plan:  The  patient's preventative maintenance and recommended screening tests for an annual wellness exam were reviewed in full today. Brought up to date unless services declined.  Counselled on the importance of diet, exercise, and its role in overall health and mortality. The patient's FH and SH was reviewed, including their home life, tobacco status, and drug and alcohol status.   Partial hysterectomy,then BSO no pap neeeded, no DVE.  Nml Mammogram 12/2011, due,mother with breast cancer history.  UptoDate with vaccines.  Colon cancer screening:02/25/2009 nml, rpt in 10 years.

## 2013-03-28 NOTE — Assessment & Plan Note (Signed)
Restart stretches.. more info given. Refer to podiatry.

## 2013-03-28 NOTE — Assessment & Plan Note (Signed)
Very poor control.Marland Kitchen Restart simvastatin. Encouraged exercise, weight loss, healthy eating habits.

## 2013-03-28 NOTE — Assessment & Plan Note (Signed)
Fatty liver improved with weight loss.

## 2013-04-26 ENCOUNTER — Other Ambulatory Visit: Payer: Self-pay | Admitting: Family Medicine

## 2013-05-09 ENCOUNTER — Other Ambulatory Visit: Payer: 59

## 2013-05-09 ENCOUNTER — Ambulatory Visit (INDEPENDENT_AMBULATORY_CARE_PROVIDER_SITE_OTHER): Payer: 59 | Admitting: Family Medicine

## 2013-05-09 ENCOUNTER — Encounter: Payer: Self-pay | Admitting: Family Medicine

## 2013-05-09 ENCOUNTER — Telehealth: Payer: Self-pay | Admitting: Family Medicine

## 2013-05-09 VITALS — BP 140/90 | HR 86 | Wt 208.0 lb

## 2013-05-09 DIAGNOSIS — R350 Frequency of micturition: Secondary | ICD-10-CM

## 2013-05-09 LAB — POCT URINALYSIS DIPSTICK
Bilirubin, UA: NEGATIVE
Ketones, UA: NEGATIVE
Protein, UA: NEGATIVE
Spec Grav, UA: 1.025
pH, UA: 6.5

## 2013-05-09 MED ORDER — CEPHALEXIN 500 MG PO CAPS: 500.0000 mg | ORAL_CAPSULE | Freq: Two times a day (BID) | ORAL | Status: DC

## 2013-05-09 MED ORDER — PHENAZOPYRIDINE HCL 200 MG PO TABS: 200.0000 mg | ORAL_TABLET | Freq: Three times a day (TID) | ORAL | Status: AC

## 2013-05-09 NOTE — Telephone Encounter (Signed)
Patient Information:  Caller Name: [place]  Phone: [phone]  Patient: Mia Kline, Mia Kline  Gender: Female  DOB: 1956/03/02  Age: 57 Years  PCP: Kerby Nora (Family Practice)  Office Follow Up:  Does the office need to follow up with this patient?: No  Instructions For The Office: N/A  RN Note:  Low back pain intermittently. May use otc urinary analgesic and or Ibuprofen po as directed.  No apopintments remain in [facility] office so scheduled for 1300 05/09/13 with Dr Katrinka Blazing at [place] office.   Symptoms  Reason For Call & Symptoms: Urinary symptoms with urgency, dysuria and frequency.  Reviewed Health History In EMR: Yes  Reviewed Medications In EMR: Yes  Reviewed Allergies In EMR: Yes  Reviewed Surgeries / Procedures: Yes  Date of Onset of Symptoms: 05/09/2013  Guideline(s) Used:  Urination Pain - Female  Disposition Per Guideline:   Go to Office Now  Reason For Disposition Reached:   Side (flank) or lower back pain present  Advice Given:  Fluids:   Drink extra fluids. Drink 8-10 glasses of liquids a day (Reason: to produce a dilute, non-irritating urine).  Cranberry Juice:   Some people think that drinking cranberry juice may help in fighting urinary tract infections. However, there is no good research that has ever proved this.  Warm Saline SITZ Baths to Reduce Pain:  Sit in a warm saline bath for 20 minutes to cleanse the area and to reduce pain. Add 2 oz. of table salt or baking soda to a tub of water.  Call Back If:  You become worse.  Patient Will Follow Care Advice:  YES

## 2013-05-09 NOTE — Progress Notes (Signed)
SUBJECTIVE: Mia Kline is a 57 y.o. female who complains of urinary frequency, urgency and dysuria x 2 days, without flank pain, fever, chills, but continues to have some mild discharge that she has had previously. Patient states though this is usually less than normal at this time. Patient denies any back pain out of the ordinary.  Past medical history, social, surgical and family history all reviewed. Hx of hematuria   OBJECTIVE: Appears well, in no apparent distress.  Vital signs are normal. The abdomen is soft without tenderness, guarding, mass, rebound or organomegaly. No CVA tenderness or inguinal adenopathy noted. Urine dipstick shows positive for RBC's and positive for leukocytes.  Micro exam: not done.   ASSESSMENT: UTI uncomplicated without evidence of pyelonephritis, questionable for cystitis  PLAN: Treatment per orders - also push fluids, may use Pyridium OTC prn. Call or return to clinic prn if these symptoms worsen or fail to improve as anticipated. Patient has had this problem previously. Discuss following up with primary care provider again in one to 2 weeks. Urine was sent for culture in case this did grow anything. If it does not then we'll consider further workup such as urologist for potential cystitis.

## 2013-05-09 NOTE — Patient Instructions (Signed)
Very nice to meet you We will treat for a UTI Keflex 2x daily for 7 days Pyridium if needed See your PCP in 1-2 weeks. If continues would consider further workup with urologist.   Urinary Tract Infection Urinary tract infections (UTIs) can develop anywhere along your urinary tract. Your urinary tract is your body's drainage system for removing wastes and extra water. Your urinary tract includes two kidneys, two ureters, a bladder, and a urethra. Your kidneys are a pair of bean-shaped organs. Each kidney is about the size of your fist. They are located below your ribs, one on each side of your spine. CAUSES Infections are caused by microbes, which are microscopic organisms, including fungi, viruses, and bacteria. These organisms are so small that they can only be seen through a microscope. Bacteria are the microbes that most commonly cause UTIs. SYMPTOMS  Symptoms of UTIs may vary by age and gender of the patient and by the location of the infection. Symptoms in young women typically include a frequent and intense urge to urinate and a painful, burning feeling in the bladder or urethra during urination. Older women and men are more likely to be tired, shaky, and weak and have muscle aches and abdominal pain. A fever may mean the infection is in your kidneys. Other symptoms of a kidney infection include pain in your back or sides below the ribs, nausea, and vomiting. DIAGNOSIS To diagnose a UTI, your caregiver will ask you about your symptoms. Your caregiver also will ask to provide a urine sample. The urine sample will be tested for bacteria and white blood cells. White blood cells are made by your body to help fight infection. TREATMENT  Typically, UTIs can be treated with medication. Because most UTIs are caused by a bacterial infection, they usually can be treated with the use of antibiotics. The choice of antibiotic and length of treatment depend on your symptoms and the type of bacteria causing  your infection. HOME CARE INSTRUCTIONS  If you were prescribed antibiotics, take them exactly as your caregiver instructs you. Finish the medication even if you feel better after you have only taken some of the medication.  Drink enough water and fluids to keep your urine clear or pale yellow.  Avoid caffeine, tea, and carbonated beverages. They tend to irritate your bladder.  Empty your bladder often. Avoid holding urine for long periods of time.  Empty your bladder before and after sexual intercourse.  After a bowel movement, women should cleanse from front to back. Use each tissue only once. SEEK MEDICAL CARE IF:   You have back pain.  You develop a fever.  Your symptoms do not begin to resolve within 3 days. SEEK IMMEDIATE MEDICAL CARE IF:   You have severe back pain or lower abdominal pain.  You develop chills.  You have nausea or vomiting.  You have continued burning or discomfort with urination. MAKE SURE YOU:   Understand these instructions.  Will watch your condition.  Will get help right away if you are not doing well or get worse. Document Released: 04/26/2005 Document Revised: 01/16/2012 Document Reviewed: 08/25/2011 [facility] Patient Information 2014 [place], [place].

## 2013-05-12 LAB — URINE CULTURE

## 2013-05-16 ENCOUNTER — Other Ambulatory Visit: Payer: Self-pay | Admitting: Family Medicine

## 2013-05-23 ENCOUNTER — Ambulatory Visit (INDEPENDENT_AMBULATORY_CARE_PROVIDER_SITE_OTHER): Payer: 59 | Admitting: Family Medicine

## 2013-05-23 ENCOUNTER — Encounter: Payer: Self-pay | Admitting: Family Medicine

## 2013-05-23 VITALS — BP 130/84 | HR 75

## 2013-05-23 DIAGNOSIS — N390 Urinary tract infection, site not specified: Secondary | ICD-10-CM

## 2013-05-23 NOTE — Progress Notes (Signed)
SUBJECTIVE: Mia Kline is a 57 y.o. female who is here for followup for diagnosed urinary tract infection. Patient did have urine culture that did grow out pansensitive E coli.  Patient did finish the Keflex in the peridium. Patient states she is completely asymptomatic. Patient states that she started feeling better about day 3 of antibiotics. Patient denies any pain with urination, denies any back pain. No fevers no chills.   Denies fever, chills, nausea vomiting abdominal pain, dysuria, chest pain, shortness of breath dyspnea on exertion or numbness in extremities   Past medical history, social, surgical and family history all reviewed. Hx of hematuria  Blood pressure 130/84, pulse 75, SpO2 96.00%.  OBJECTIVE: General appearance: alert and cooperative Eyes: conjunctivae/corneas clear. PERRL, EOM's intact. Fundi benign. Nose: Nares normal. Septum midline. Mucosa normal. No drainage or sinus tenderness. Throat: lips, mucosa, and tongue normal; teeth and gums normal Lungs: clear to auscultation bilaterally Heart: regular rate and rhythm, S1, S2 normal, no murmur, click, rub or gallop Abdomen: soft, non-tender; bowel sounds normal; no masses,  no organomegaly Extremities: extremities normal, atraumatic, no cyanosis or edema Pulses: 2+ and symmetric Skin: Skin color, texture, turgor normal. No rashes or lesions Lymph nodes: Cervical, supraclavicular, and axillary nodes normal. Neurologic: Grossly normal    ASSESSMENT: Resolved urinary tract infection was cultured growing pansensitive E. coli  PLAN: Patient will continue to follow up if any returning signs. With patient having multiple urinary tract infections fairly quickly we may consider neurology referral if it occurs again. As an outpatient followup as needed.

## 2013-06-15 ENCOUNTER — Other Ambulatory Visit: Payer: Self-pay | Admitting: Family Medicine

## 2013-07-13 ENCOUNTER — Other Ambulatory Visit: Payer: Self-pay | Admitting: Family Medicine

## 2013-07-13 NOTE — Telephone Encounter (Signed)
Last office visit 03/28/2013. Ok to refill?

## 2013-08-10 ENCOUNTER — Other Ambulatory Visit: Payer: Self-pay | Admitting: Family Medicine

## 2013-08-10 NOTE — Telephone Encounter (Signed)
Last office visit 03/28/2013.  Ok to refill? 

## 2013-09-23 ENCOUNTER — Other Ambulatory Visit (INDEPENDENT_AMBULATORY_CARE_PROVIDER_SITE_OTHER): Payer: 59

## 2013-09-23 DIAGNOSIS — E785 Hyperlipidemia, unspecified: Secondary | ICD-10-CM

## 2013-09-23 LAB — LIPID PANEL
Cholesterol: 197 mg/dL (ref 0–200)
HDL: 58.4 mg/dL (ref >39.00)
LDL Cholesterol: 100 mg/dL — ABNORMAL HIGH (ref 0–99)
Total CHOL/HDL Ratio: 3
Triglycerides: 191 mg/dL — ABNORMAL HIGH (ref 0.0–149.0)
VLDL: 38.2 mg/dL (ref 0.0–40.0)

## 2013-09-30 ENCOUNTER — Ambulatory Visit: Payer: 59 | Admitting: Family Medicine

## 2013-10-03 ENCOUNTER — Encounter: Payer: Self-pay | Admitting: Family Medicine

## 2013-10-03 ENCOUNTER — Ambulatory Visit (INDEPENDENT_AMBULATORY_CARE_PROVIDER_SITE_OTHER): Payer: 59 | Admitting: Family Medicine

## 2013-10-03 VITALS — BP 110/80 | HR 78 | Temp 97.7°F | Ht 67.72 in | Wt 204.5 lb

## 2013-10-03 DIAGNOSIS — F5104 Psychophysiologic insomnia: Secondary | ICD-10-CM | POA: Insufficient documentation

## 2013-10-03 DIAGNOSIS — E785 Hyperlipidemia, unspecified: Secondary | ICD-10-CM

## 2013-10-03 DIAGNOSIS — G4700 Insomnia, unspecified: Secondary | ICD-10-CM

## 2013-10-03 DIAGNOSIS — I10 Essential (primary) hypertension: Secondary | ICD-10-CM

## 2013-10-03 NOTE — Progress Notes (Signed)
58 year old female presents for 6 month follow up.She has multiple chronic health issues.    She has started taking aleve PM at night to help with insomnia.  Hypertension: Great control today on HCTZ  BP Readings from Last 3 Encounters:  10/03/13 110/80  05/23/13 130/84  05/09/13 140/90  Chest pain with exertion:None  Edema:None  Short of breath:None  Average home BPs: not checking   Lightheadedness resolved.  She has lost 14 lbs in last 6 months!! She is down almost 40Lbs since 2013. Wt Readings from Last 3 Encounters:  10/03/13 204 lb 8 oz (92.761 kg)  05/09/13 208 lb (94.348 kg)  03/28/13 218 lb 8 oz (99.111 kg)    She has been trying to exercise regularly.   Elevated Cholesterol: LDL now back at goal back on simvastatin. Lab Results  Component Value Date   CHOL 197 09/23/2013   HDL 58.40 09/23/2013   LDLCALC 100* 09/23/2013   LDLDIRECT 207.8 03/25/2013   TRIG 191.0* 09/23/2013   CHOLHDL 3 09/23/2013  Diet compliance: Good  Exercise: trying to get back  Other complaints:   Fibromyalgia: stable control.   Review of Systems  Constitutional: Positive for fatigue. Negative for fever.  HENT: Negative for ear pain.  Eyes: Negative for pain.  Respiratory: Negative for chest tightness and shortness of breath.  Cardiovascular: Negative for chest pain, palpitations and leg swelling.  Gastrointestinal: Negative for abdominal pain.  Genitourinary: Negative for dysuria.  Objective:   Physical Exam  Constitutional: Vital signs are normal. She appears well-developed and well-nourished. She is cooperative. Non-toxic appearance. She does not appear ill. No distress.  HENT:  Head: Normocephalic.  Right Ear: Hearing, tympanic membrane, external ear and ear canal normal.  Left Ear: Hearing, tympanic membrane, external ear and ear canal normal.  Nose: Nose normal.  Eyes: Conjunctivae, EOM and lids are normal. Pupils are equal, round, and reactive to light. Lids are everted and swept,  no foreign bodies found.  Neck: Trachea normal and normal range of motion. Neck supple. Carotid bruit is not present. No mass and no thyromegaly present.  Cardiovascular: Normal rate, regular rhythm, S1 normal, S2 normal, normal heart sounds and intact distal pulses. Exam reveals no gallop.  No murmur heard.  Pulmonary/Chest: Effort normal and breath sounds normal. No respiratory distress. She has no wheezes. She has no rhonchi. She has no rales.  Abdominal: Soft. Normal appearance and bowel sounds are normal. She exhibits no distension, no fluid wave, no abdominal bruit and no mass. There is no hepatosplenomegaly. There is no tenderness. There is no rebound, no guarding and no CVA tenderness. No hernia.  Neurological: She is alert. She has normal strength. No cranial nerve deficit or sensory deficit.  Skin: Skin is warm, dry and intact. No rash noted.  Psychiatric: Judgment normal. Her mood appears not anxious. Her affect is blunt. She is slowed and withdrawn. Cognition and memory are normal. She does not exhibit a depressed mood. She expresses no suicidal plans and no homicidal plans.

## 2013-10-03 NOTE — Progress Notes (Signed)
Pre visit review using our clinic review tool, if applicable. No additional management support is needed unless otherwise documented below in the visit note. 

## 2013-10-03 NOTE — Assessment & Plan Note (Signed)
Well controlled. Continue current medication.  

## 2013-10-03 NOTE — Patient Instructions (Signed)
Get back to regular exercise as able. Morning exercise is better than before bed. Schedule CPX with labs prior 6 months.

## 2013-10-03 NOTE — Assessment & Plan Note (Signed)
Now back at goal onlow dose simvastatin daily Encouraged exercise, weight loss, healthy eating habits.

## 2013-10-03 NOTE — Assessment & Plan Note (Signed)
Okay to use aleve PM as needed.  Discussed exercising earlier in the day.

## 2013-10-04 ENCOUNTER — Telehealth: Payer: Self-pay | Admitting: Family Medicine

## 2013-10-04 NOTE — Telephone Encounter (Signed)
Relevant patient education mailed to patient.  

## 2013-10-06 ENCOUNTER — Telehealth: Payer: Self-pay | Admitting: Family Medicine

## 2013-10-06 NOTE — Telephone Encounter (Signed)
Relevant patient education mailed to patient.  

## 2013-11-17 ENCOUNTER — Other Ambulatory Visit: Payer: Self-pay | Admitting: *Deleted

## 2013-11-17 MED ORDER — HYDROCHLOROTHIAZIDE 25 MG PO TABS: ORAL_TABLET | ORAL | Status: DC

## 2013-11-18 MED ORDER — DICLOFENAC SODIUM 75 MG PO TBEC: DELAYED_RELEASE_TABLET | ORAL | Status: DC

## 2013-12-22 ENCOUNTER — Other Ambulatory Visit: Payer: Self-pay | Admitting: Family Medicine

## 2014-01-14 ENCOUNTER — Other Ambulatory Visit: Payer: Self-pay | Admitting: Family Medicine

## 2014-02-12 ENCOUNTER — Other Ambulatory Visit: Payer: Self-pay | Admitting: Family Medicine

## 2014-02-13 NOTE — Telephone Encounter (Signed)
Last office visit 10/03/2013.  Ok to refill?

## 2014-03-19 ENCOUNTER — Other Ambulatory Visit: Payer: Self-pay | Admitting: Family Medicine

## 2014-03-27 ENCOUNTER — Other Ambulatory Visit: Payer: Self-pay | Admitting: Family Medicine

## 2014-03-27 NOTE — Telephone Encounter (Signed)
Last office visit 10/03/2013.  Ok to refill?

## 2014-03-31 ENCOUNTER — Telehealth: Payer: Self-pay | Admitting: Family Medicine

## 2014-03-31 DIAGNOSIS — E785 Hyperlipidemia, unspecified: Secondary | ICD-10-CM

## 2014-03-31 NOTE — Telephone Encounter (Signed)
Message copied by Excell Seltzer on Tue Mar 31, 2014  4:26 PM ------      Message from: Alvina Chou      Created: Fri Mar 27, 2014  9:16 AM      Regarding: Lab orders for 9.2.15       Patient is scheduled for CPX labs, please order future labs, Thanks , Terri       ------

## 2014-04-01 ENCOUNTER — Other Ambulatory Visit (INDEPENDENT_AMBULATORY_CARE_PROVIDER_SITE_OTHER): Payer: 59

## 2014-04-01 DIAGNOSIS — E785 Hyperlipidemia, unspecified: Secondary | ICD-10-CM

## 2014-04-01 LAB — COMPREHENSIVE METABOLIC PANEL
ALT: 41 U/L — AB (ref 0–35)
AST: 31 U/L (ref 0–37)
Albumin: 3.8 g/dL (ref 3.5–5.2)
Alkaline Phosphatase: 47 U/L (ref 39–117)
BILIRUBIN TOTAL: 0.5 mg/dL (ref 0.2–1.2)
BUN: 16 mg/dL (ref 6–23)
CALCIUM: 9.1 mg/dL (ref 8.4–10.5)
CHLORIDE: 105 meq/L (ref 96–112)
CO2: 31 mEq/L (ref 19–32)
Creatinine, Ser: 0.9 mg/dL (ref 0.4–1.2)
GFR: 71.95 mL/min (ref >60.00)
Glucose, Bld: 95 mg/dL (ref 70–99)
Potassium: 3.9 mEq/L (ref 3.5–5.1)
Sodium: 143 mEq/L (ref 135–145)
Total Protein: 6.5 g/dL (ref 6.0–8.3)

## 2014-04-01 LAB — LIPID PANEL
CHOL/HDL RATIO: 3
Cholesterol: 170 mg/dL (ref 0–200)
HDL: 50.9 mg/dL (ref >39.00)
LDL Cholesterol: 79 mg/dL (ref 0–99)
NONHDL: 119.1
Triglycerides: 199 mg/dL — ABNORMAL HIGH (ref 0.0–149.0)
VLDL: 39.8 mg/dL (ref 0.0–40.0)

## 2014-04-07 ENCOUNTER — Encounter: Payer: 59 | Admitting: Family Medicine

## 2014-04-13 ENCOUNTER — Encounter: Payer: Self-pay | Admitting: Gastroenterology

## 2014-05-21 ENCOUNTER — Other Ambulatory Visit: Payer: Self-pay

## 2014-05-21 MED ORDER — DICLOFENAC SODIUM 1 % TD GEL: 2.0000 g | TRANSDERMAL | Status: DC | PRN

## 2014-05-21 MED ORDER — CELECOXIB 200 MG PO CAPS: 200.0000 mg | ORAL_CAPSULE | Freq: Every day | ORAL | Status: DC

## 2014-05-21 NOTE — Telephone Encounter (Signed)
Pt request refill for voltaren gel and celebrex due to [facility] pain [facility]. Pt last seen 10/03/2013 and scheduled CPX 07/30/14.Please advise.

## 2014-05-22 ENCOUNTER — Telehealth: Payer: Self-pay | Admitting: *Deleted

## 2014-05-22 NOTE — Telephone Encounter (Signed)
Received prior authorization request from [facility] for Celecoxib.  Form completed by Dr. Ermalene SearingBedsole and faxed back to [facility] at [phone].

## 2014-05-24 ENCOUNTER — Telehealth: Payer: Self-pay | Admitting: Family Medicine

## 2014-05-24 NOTE — Telephone Encounter (Signed)
Last office visit 10/03/2013.  Last refilled 02/13/2014 for #180 with no refills.  CPE scheduled for 07/30/2014.  Ok to refill?

## 2014-05-25 MED ORDER — CELECOXIB 200 MG PO CAPS: 200.0000 mg | ORAL_CAPSULE | Freq: Every day | ORAL | Status: DC

## 2014-05-25 MED ORDER — DICLOFENAC SODIUM 1 % TD GEL: 2.0000 g | TRANSDERMAL | Status: DC | PRN

## 2014-05-25 NOTE — Telephone Encounter (Signed)
Celecoxib approved effective 04/22/2014 through 05/22/2015.

## 2014-05-25 NOTE — Addendum Note (Signed)
Addended by: Damita LackLORING, DONNA S on: 05/25/2014 10:24 AM   Modules accepted: Orders

## 2014-05-25 NOTE — Telephone Encounter (Signed)
Prescriptions sent to [facility] has requested.  Debbie notified by telephone.

## 2014-05-25 NOTE — Telephone Encounter (Signed)
Pt left v/m that [facility] does not fill 30 day prescriptions and pt request celebrex # 90 and voltaren gel # 3 tubes [facility] to [facility].pt request cb when done.

## 2014-06-14 ENCOUNTER — Other Ambulatory Visit: Payer: Self-pay | Admitting: Family Medicine

## 2014-07-30 ENCOUNTER — Ambulatory Visit (INDEPENDENT_AMBULATORY_CARE_PROVIDER_SITE_OTHER): Payer: 59 | Admitting: Family Medicine

## 2014-07-30 ENCOUNTER — Encounter: Payer: Self-pay | Admitting: Family Medicine

## 2014-07-30 VITALS — BP 126/84 | HR 75 | Temp 98.2°F | Ht 67.75 in | Wt 228.8 lb

## 2014-07-30 DIAGNOSIS — Z0000 Encounter for general adult medical examination without abnormal findings: Secondary | ICD-10-CM

## 2014-07-30 DIAGNOSIS — E785 Hyperlipidemia, unspecified: Secondary | ICD-10-CM

## 2014-07-30 DIAGNOSIS — I10 Essential (primary) hypertension: Secondary | ICD-10-CM

## 2014-07-30 DIAGNOSIS — R945 Abnormal results of liver function studies: Secondary | ICD-10-CM

## 2014-07-30 DIAGNOSIS — K589 Irritable bowel syndrome without diarrhea: Secondary | ICD-10-CM

## 2014-07-30 DIAGNOSIS — R7989 Other specified abnormal findings of blood chemistry: Secondary | ICD-10-CM

## 2014-07-30 NOTE — Assessment & Plan Note (Signed)
Work on low fat diet and restart exercise. Work on weight loss.

## 2014-07-30 NOTE — Patient Instructions (Addendum)
Work on low fat diet and restart exercise. Work on weight loss.NCrease fiber , water and exercise to treat constipation. Can try miralax for constipation.  Make appt in next few weeks to discuss other acute issues if continuing. 30 min.

## 2014-07-30 NOTE — Progress Notes (Signed)
The patient is here for annual wellness exam and preventative care.   She has multiple chronic health issues.  Has some pain in right mid back, ache, no change with movement. She is having some constipation. Has run out senna for few weeks, BMs occuring every day, but straining. No dysuria. No fever.   Hypertension: Borderline control today on HCTZ Chest pain with exertion:None Edema:None Short of breath:None Average home BPs: not checking. BP Readings from Last 3 Encounters:  07/30/14 126/84  10/03/13 110/80  05/23/13 130/84   She has not been exercising.  Wt Readings from Last 3 Encounters:  07/30/14 228 lb 12 oz (103.76 kg)  10/03/13 204 lb 8 oz (92.761 kg)  05/09/13 208 lb (94.348 kg)     Elevated Cholesterol: LDL and triglycerides improved control back on simvastatin. Lab Results  Component Value Date   CHOL 170 04/01/2014   HDL 50.90 04/01/2014   LDLCALC 79 04/01/2014   LDLDIRECT 207.8 03/25/2013   TRIG 199.0* 04/01/2014   CHOLHDL 3 04/01/2014   tolerating medication okay at this time. Diet compliance: Good Exercise: trying to get back Other complaints:   Fibromyalgia: stable control.   Review of Systems  Constitutional: Positive for fatigue. Negative for fever.  HENT: Negative for ear pain.  Eyes: Negative for pain.  Respiratory: Negative for chest tightness and shortness of breath.  Cardiovascular: Negative for chest pain, palpitations and leg swelling.  Gastrointestinal: Negative for abdominal pain.  Genitourinary: Negative for dysuria.       Objective:   Physical Exam  Constitutional: Vital signs are normal. She appears well-developed and well-nourished. She is cooperative. Non-toxic appearance. She does not appear ill. No distress.  HENT:  Head: Normocephalic.  Right Ear: Hearing, tympanic membrane, external ear and ear canal normal.  Left Ear: Hearing, tympanic membrane, external ear and ear canal normal.  Nose: Nose normal.   Eyes: Conjunctivae, EOM and lids are normal. Pupils are equal, round, and reactive to light. Lids are everted and swept, no foreign bodies found.  Neck: Trachea normal and normal range of motion. Neck supple. Carotid bruit is not present. No mass and no thyromegaly present.  Cardiovascular: Normal rate, regular rhythm, S1 normal, S2 normal, normal heart sounds and intact distal pulses. Exam reveals no gallop.  No murmur heard. Pulmonary/Chest: Effort normal and breath sounds normal. No respiratory distress. She has no wheezes. She has no rhonchi. She has no rales.  Abdominal: Soft. Normal appearance and bowel sounds are normal. She exhibits no distension, no fluid wave, no abdominal bruit and no mass. There is no hepatosplenomegaly. There is no tenderness. There is no rebound, no guarding and no CVA tenderness. No hernia.  Genitourinary: No breast swelling, tenderness, discharge or bleeding. Pelvic exam was performed with patient supine.  Lymphadenopathy:   She has no cervical adenopathy.   She has no axillary adenopathy.  Neurological: She is alert. She has normal strength. No cranial nerve deficit or sensory deficit.  Skin: Skin is warm, dry and intact. No rash noted.  Psychiatric: Judgment normal. Her mood appears not anxious. Her affect is blunt. She is slowed and withdrawn. Cognition and memory are normal. She does not exhibit a depressed mood. She expresses no suicidal plans and no homicidal plans.          Assessment & Plan:  The patient's preventative maintenance and recommended screening tests for an annual wellness exam were reviewed in full today. Brought up to date unless services declined.  Counselled on the importance  of diet, exercise, and its role in overall health and mortality. The patient's FH and SH was reviewed, including their home life, tobacco status, and drug and alcohol status.   Partial hysterectomy,then BSO no pap neeeded, no DVE.  Nml Mammogram  12/2011, due,mother with breast cancer history.  SHE REFUSES AT THIS TIME. She is open to office breast. UptoDate with vaccines, got flu vaccine at work.  Colon cancer screening:02/25/2009 nml, rpt in 10 years.

## 2014-07-30 NOTE — Assessment & Plan Note (Signed)
Improved control back on simvastatin.

## 2014-07-30 NOTE — Progress Notes (Signed)
Pre visit review using our clinic review tool, if applicable. No additional management support is needed unless otherwise documented below in the visit note. 

## 2014-07-30 NOTE — Assessment & Plan Note (Signed)
Well controlled. Continue current medication.  

## 2014-07-30 NOTE — Assessment & Plan Note (Signed)
Treat constipation with miralax, fiber water and exercise.

## 2014-08-18 ENCOUNTER — Other Ambulatory Visit: Payer: Self-pay | Admitting: Family Medicine

## 2014-09-09 ENCOUNTER — Other Ambulatory Visit: Payer: Self-pay | Admitting: *Deleted

## 2014-09-09 MED ORDER — DICLOFENAC SODIUM 75 MG PO TBEC: 75.0000 mg | DELAYED_RELEASE_TABLET | Freq: Two times a day (BID) | ORAL | Status: DC

## 2014-09-09 NOTE — Telephone Encounter (Signed)
Last office visit 07/30/2014.  Last refilled 05/24/2014 for #180 with no refills.  Ok to refill?

## 2014-09-22 ENCOUNTER — Other Ambulatory Visit: Payer: Self-pay | Admitting: Family Medicine

## 2014-11-26 ENCOUNTER — Other Ambulatory Visit: Payer: Self-pay | Admitting: Family Medicine

## 2014-12-24 ENCOUNTER — Telehealth: Payer: Self-pay | Admitting: Family Medicine

## 2014-12-24 NOTE — Telephone Encounter (Signed)
Pt brought in forms to be completed [facility] ZO:[id number]:screening form/physical and biometrics info. Best number to reach pt at is [phone]. Placing forms on Donna's desk.

## 2014-12-24 NOTE — Telephone Encounter (Signed)
Forms completed and placed in Dr. Daphine DeutscherBedsole's in box for signature.

## 2014-12-25 NOTE — Telephone Encounter (Signed)
Health Provider Screening form completed [facility] [phone].

## 2014-12-25 NOTE — Telephone Encounter (Signed)
Complete in outbox. 

## 2015-02-08 ENCOUNTER — Encounter: Payer: Self-pay | Admitting: Internal Medicine

## 2015-02-08 ENCOUNTER — Ambulatory Visit (INDEPENDENT_AMBULATORY_CARE_PROVIDER_SITE_OTHER): Payer: 59 | Admitting: Internal Medicine

## 2015-02-08 ENCOUNTER — Telehealth: Payer: Self-pay | Admitting: Family Medicine

## 2015-02-08 VITALS — BP 122/80 | HR 67 | Temp 98.4°F | Wt 222.0 lb

## 2015-02-08 DIAGNOSIS — W57XXXA Bitten or stung by nonvenomous insect and other nonvenomous arthropods, initial encounter: Secondary | ICD-10-CM

## 2015-02-08 DIAGNOSIS — T148 Other injury of unspecified body region: Secondary | ICD-10-CM

## 2015-02-08 NOTE — Telephone Encounter (Signed)
Pt has appt on 02/08/15 on 4:30 PM with Dr Alphonsus SiasLetvak.

## 2015-02-08 NOTE — Assessment & Plan Note (Signed)
Probably a spider Basically an eccymotic area distal to the bite site No evidence of secondary infection Can continue cool compresses and oatmeal paste for symptom relief

## 2015-02-08 NOTE — Telephone Encounter (Signed)
[facility] Client TELEPHONE ADVICE RECORD [facility]  Patient Name: Mia JudeDEBORAH Stenglein  Gender: Female  DOB: 24-Jun-1956   Age: [age] Y 2 M  Return Phone Number: [phone] (Primary), [phone]) [phone] (Secondary)  Address:   City/State/Zip: [place]    Client [facility]  [facility]  Physician Ermalene SearingBedsole, Amy   Contact Type Call  Call Type Triage / Clinical  Relationship To Patient Self  Return Phone Number [phone]) [phone] (Secondary)  Chief Complaint Insect Bite  Initial Comment Caller states she has insect bite, started as itchy bump on thigh Friday, has spread and discolored now  PreDisposition Call Doctor   Nurse Assessment  Nurse: Arnold LongMaples, RN, Trish Date/Time (Eastern Time): 02/08/2015 10:22:21 AM  Confirm and document reason for call. If symptomatic, describe symptoms. ---Patient is calling for self and caller states she has insect bite, started as itchy bump on thigh Friday, has spread and discolored now. Bite mark 3 in long 1 inch wide under calf on left leg. has been putting Neosporin on and oatmeal paste. Not draining. No temp  Has the patient traveled out of the country within the last 30 days? ---No  Does the patient require triage? ---Yes  Related visit to physician within the last 2 weeks? ---No  Does the PT have any chronic conditions? (i.e. diabetes, asthma, etc.) ---Yes  List chronic conditions. ---hypertension     Guidelines      Guideline Title Affirmed Question Affirmed Notes Nurse Date/Time (Eastern Time)  Insect Bite [1] Red streak or red line AND [2] length > 2 inches (5 cm)  Maples, RN, Trish 02/08/2015 10:25:51 AM   Disp. Time [name](Eastern Time) Disposition Final User          02/08/2015 10:26:34 AM See Physician within 4 Hours (or PCP triage) Yes Arnold LongMaples, RN, Les Pourish        Caller Understands: Yes  Disagree/Comply: Comply     Care Advice Given Per  Guideline      SEE PHYSICIAN WITHIN 4 HOURS (or PCP triage): CALL BACK IF: * You become worse. CARE ADVICE given per Insect Bite (Adult) guideline.   After Care Instructions Given     Call Event Type User Date / Time Description        Referrals  REFERRED TO PCP OFFICE

## 2015-02-08 NOTE — Telephone Encounter (Signed)
Will evaluate at OV 

## 2015-02-08 NOTE — Progress Notes (Signed)
Pre visit review using our clinic review tool, if applicable. No additional management support is needed unless otherwise documented below in the visit note. 

## 2015-02-08 NOTE — Progress Notes (Signed)
Subjective:    Patient ID: Mia Kline, female    DOB: 1955-09-07, 59 y.o.   MRN: [id number]  HPI Here due to insect bite  Got a bite on lower posterior left calf Didn't feel bite First noticed it early Friday 7/8 (noticed it in bed) Did not see insect  Slight puffy area at first Then spread inferiorly and started turning red fairly quickly Finally darker, hot and more painful over the past 2 days  Tried neosporin Soaked in oatmeal paste--helped the itching  Current Outpatient Prescriptions on File Prior to Visit  Medication Sig Dispense Refill  . B Complex-C (SUPER B COMPLEX PO) Take 1 tablet by mouth daily.    . Calcium Carbonate-Vitamin D (CALCIUM 600+D3 PO) Take 2 tablets by mouth daily.    . Cholecalciferol (VITAMIN D-3) 1000 UNITS CAPS Take 1 capsule by mouth daily.    . diclofenac (VOLTAREN) 75 MG EC tablet TAKE 1 TABLET TWICE A DAY 180 tablet 1  . diclofenac sodium (VOLTAREN) 1 % GEL Apply 2 g topically as needed. 300 g 0  . hydrochlorothiazide (HYDRODIURIL) 25 MG tablet TAKE 1 TABLET DAILY 90 tablet 1  . naproxen sodium (ALEVE) 220 MG tablet Take 220 mg by mouth daily.    Marland Kitchen. omeprazole (PRILOSEC) 40 MG capsule TAKE 1 CAPSULE DAILY 90 capsule 2  . simvastatin (ZOCOR) 20 MG tablet TAKE 1 TABLET AT BEDTIME 90 tablet 2  . valACYclovir (VALTREX) 500 MG tablet TAKE 1 TABLET DAILY 90 tablet 3  . vitamin E 400 UNIT capsule Take 400 Units by mouth daily.     No current facility-administered medications on file prior to visit.    Allergies  Allergen Reactions  . Codeine     REACTION: Nausea    Past Medical History  Diagnosis Date  . Abdominal pain, unspecified site   . Abdominal pain, generalized   . Elbow, forearm, and wrist, abrasion or friction burn, without mention of infection   . Anemia     Iron deficiency  . Arm pain, left   . Chest pain, unspecified   . Common migraine   . Cough   . Acute cystitis   . Depression   . Dizziness and giddiness   . Elbow  pain   . Other malaise and fatigue   . Genital herpes, unspecified   . GERD (gastroesophageal reflux disease)   . Headache(784.0)   . Heart murmur   . Hyperlipidemia   . Hypertension   . IBS (irritable bowel syndrome)   . Internal hemorrhoids without mention of complication   . Knee pain, left   . Microscopic hematuria   . Osteoarthrosis, unspecified whether generalized or localized, unspecified site   . Other screening mammogram   . Palpitations   . Paresthesia   . Routine gynecological examination   . Acute sinusitis, unspecified   . Urinary tract infection, site not specified   . Routine general medical examination at a health care facility   . Small bowel obstruction     Past Surgical History  Procedure Laterality Date  . Cardiovascular stress test  09/2009    Low risk  . Partial hysterectomy  1998    Vaginal  . Bilateral oophorectomy  2006    For large cyst  . Barium swallow  2006    Hiatal hernia  . Barium follow through  2007    because rectum twisted and couldn't do colonoscopy, was painful though    Family History  Problem Relation Age  of Onset  . Cancer Mother     Breast  . Cancer Father     Lung  . Heart disease Father     CABG  . Colon polyps Sister   . Cancer Maternal Aunt     Cirrhosis of liver    History   Social History  . Marital Status: Single    Spouse Name: N/A  . Number of Children: 3  . Years of Education: N/A   Occupational History  . Medical sales representative, [employer], [employer]    Social History Main Topics  . Smoking status: Never Smoker   . Smokeless tobacco: Never Used  . Alcohol Use: Yes  . Drug Use: No     Comment: Remote but no injected.  Marland Kitchen Sexual Activity: Not on file   Other Topics Concern  . Not on file   Social History Narrative   No regular exercise.  Diet:  Fruits and Vegetables and water.  Daily caffeine use:  1 cup.   Review of Systems  No fever No nausea Appetite is okay     Objective:    Physical Exam  Constitutional: She appears well-developed and well-nourished. No distress.  Skin:  Non blanching ~7 x 2 cm red area on lower posterior left calf Not warm or tender          Assessment & Plan:

## 2015-02-17 ENCOUNTER — Other Ambulatory Visit: Payer: Self-pay | Admitting: Family Medicine

## 2015-03-30 ENCOUNTER — Telehealth: Payer: Self-pay | Admitting: Family Medicine

## 2015-03-30 DIAGNOSIS — E785 Hyperlipidemia, unspecified: Secondary | ICD-10-CM

## 2015-03-30 DIAGNOSIS — D509 Iron deficiency anemia, unspecified: Secondary | ICD-10-CM

## 2015-03-30 NOTE — Telephone Encounter (Signed)
-----   Message from Baldomero Lamy sent at 03/29/2015  2:47 PM EDT ----- Regarding: cpx labs 9/1, need orders thanks! :-) Please order  future cpx labs for pt's upcoming lab appt. Thanks Rodney Booze

## 2015-04-01 ENCOUNTER — Other Ambulatory Visit (INDEPENDENT_AMBULATORY_CARE_PROVIDER_SITE_OTHER): Payer: 59

## 2015-04-01 DIAGNOSIS — D509 Iron deficiency anemia, unspecified: Secondary | ICD-10-CM

## 2015-04-01 DIAGNOSIS — E785 Hyperlipidemia, unspecified: Secondary | ICD-10-CM

## 2015-04-01 LAB — COMPREHENSIVE METABOLIC PANEL
ALBUMIN: 4.2 g/dL (ref 3.5–5.2)
ALT: 55 U/L — ABNORMAL HIGH (ref 0–35)
AST: 34 U/L (ref 0–37)
Alkaline Phosphatase: 55 U/L (ref 39–117)
BUN: 13 mg/dL (ref 6–23)
CHLORIDE: 105 meq/L (ref 96–112)
CO2: 30 meq/L (ref 19–32)
Calcium: 9.6 mg/dL (ref 8.4–10.5)
Creatinine, Ser: 0.93 mg/dL (ref 0.40–1.20)
GFR: 65.52 mL/min (ref >60.00)
Glucose, Bld: 95 mg/dL (ref 70–99)
POTASSIUM: 3.8 meq/L (ref 3.5–5.1)
SODIUM: 143 meq/L (ref 135–145)
Total Bilirubin: 0.4 mg/dL (ref 0.2–1.2)
Total Protein: 6.4 g/dL (ref 6.0–8.3)

## 2015-04-01 LAB — CBC WITH DIFFERENTIAL/PLATELET
BASOS PCT: 0.5 % (ref 0.0–3.0)
Basophils Absolute: 0 10*3/uL (ref 0.0–0.1)
EOS PCT: 2.4 % (ref 0.0–5.0)
Eosinophils Absolute: 0.1 10*3/uL (ref 0.0–0.7)
HEMATOCRIT: 38.8 % (ref 36.0–46.0)
HEMOGLOBIN: 12.7 g/dL (ref 12.0–15.0)
Lymphocytes Relative: 37.4 % (ref 12.0–46.0)
Lymphs Abs: 2.1 10*3/uL (ref 0.7–4.0)
MCHC: 32.8 g/dL (ref 30.0–36.0)
MCV: 82.8 fl (ref 78.0–100.0)
MONOS PCT: 7.4 % (ref 3.0–12.0)
Monocytes Absolute: 0.4 10*3/uL (ref 0.1–1.0)
Neutro Abs: 2.9 10*3/uL (ref 1.4–7.7)
Neutrophils Relative %: 52.3 % (ref 43.0–77.0)
Platelets: 214 10*3/uL (ref 150.0–400.0)
RBC: 4.68 Mil/uL (ref 3.87–5.11)
RDW: 15.9 % — AB (ref 11.5–15.5)
WBC: 5.5 10*3/uL (ref 4.0–10.5)

## 2015-04-01 LAB — LIPID PANEL
CHOL/HDL RATIO: 3
Cholesterol: 191 mg/dL (ref 0–200)
HDL: 57.7 mg/dL (ref >39.00)
LDL CALC: 100 mg/dL — AB (ref 0–99)
NONHDL: 133.14
Triglycerides: 168 mg/dL — ABNORMAL HIGH (ref 0.0–149.0)
VLDL: 33.6 mg/dL (ref 0.0–40.0)

## 2015-04-19 ENCOUNTER — Other Ambulatory Visit: Payer: Self-pay

## 2015-04-20 ENCOUNTER — Encounter: Payer: Self-pay | Admitting: Family Medicine

## 2015-04-20 ENCOUNTER — Ambulatory Visit (INDEPENDENT_AMBULATORY_CARE_PROVIDER_SITE_OTHER): Payer: 59 | Admitting: Family Medicine

## 2015-04-20 VITALS — BP 120/80 | HR 78 | Temp 98.7°F | Ht 68.0 in | Wt 220.2 lb

## 2015-04-20 DIAGNOSIS — R42 Dizziness and giddiness: Secondary | ICD-10-CM

## 2015-04-20 DIAGNOSIS — Z0000 Encounter for general adult medical examination without abnormal findings: Secondary | ICD-10-CM

## 2015-04-20 DIAGNOSIS — E785 Hyperlipidemia, unspecified: Secondary | ICD-10-CM

## 2015-04-20 DIAGNOSIS — R7989 Other specified abnormal findings of blood chemistry: Secondary | ICD-10-CM

## 2015-04-20 DIAGNOSIS — M791 Myalgia: Secondary | ICD-10-CM | POA: Diagnosis not present

## 2015-04-20 DIAGNOSIS — M797 Fibromyalgia: Secondary | ICD-10-CM | POA: Diagnosis not present

## 2015-04-20 DIAGNOSIS — M2550 Pain in unspecified joint: Secondary | ICD-10-CM

## 2015-04-20 DIAGNOSIS — M609 Myositis, unspecified: Secondary | ICD-10-CM

## 2015-04-20 DIAGNOSIS — W57XXXA Bitten or stung by nonvenomous insect and other nonvenomous arthropods, initial encounter: Secondary | ICD-10-CM

## 2015-04-20 DIAGNOSIS — I10 Essential (primary) hypertension: Secondary | ICD-10-CM

## 2015-04-20 DIAGNOSIS — R945 Abnormal results of liver function studies: Secondary | ICD-10-CM

## 2015-04-20 DIAGNOSIS — IMO0001 Reserved for inherently not codable concepts without codable children: Secondary | ICD-10-CM

## 2015-04-20 DIAGNOSIS — D509 Iron deficiency anemia, unspecified: Secondary | ICD-10-CM

## 2015-04-20 DIAGNOSIS — T148 Other injury of unspecified body region: Secondary | ICD-10-CM

## 2015-04-20 LAB — T3, FREE: T3 FREE: 3.1 pg/mL (ref 2.3–4.2)

## 2015-04-20 LAB — TSH: TSH: 1.35 u[IU]/mL (ref 0.35–4.50)

## 2015-04-20 LAB — T4, FREE: FREE T4: 0.92 ng/dL (ref 0.60–1.60)

## 2015-04-20 NOTE — Assessment & Plan Note (Signed)
Well controlled. Continue current medication. Encouraged exercise, weight loss, healthy eating habits.  

## 2015-04-20 NOTE — Progress Notes (Signed)
Pre visit review using our clinic review tool, if applicable. No additional management support is needed unless otherwise documented below in the visit note. 

## 2015-04-20 NOTE — Progress Notes (Signed)
The patient is here for annual wellness exam and preventative care.   She has multiple chronic health issues.  Occ feeling jittery, dizzy at times when moving head.  Feels like brain spinning. Ongoing for month, occurs couples times a day.  No clear association.  She has been bitten bite ticks over the summer. No fever.   Hypertension: Good control today on HCTZ Chest pain with exertion:None Edema:None Short of breath:None Average home BPs: not checking. BP Readings from Last 3 Encounters:  04/20/15 120/80  02/08/15 122/80  07/30/14 126/84   She has not been exercising.  Wt Readings from Last 3 Encounters:  04/20/15 220 lb 4 oz (99.905 kg)  02/08/15 222 lb (100.699 kg)  07/30/14 228 lb 12 oz (103.76 kg)   Elevated Cholesterol: LDL and triglycerides At goal LDL < 130 on simvastatin. Lab Results  Component Value Date   CHOL 191 04/01/2015   HDL 57.70 04/01/2015   LDLCALC 100* 04/01/2015   LDLDIRECT 207.8 03/25/2013   TRIG 168.0* 04/01/2015   CHOLHDL 3 04/01/2015  Tolerating medication. Diet compliance: Good Exercise: Walking 2 miles at lunchtime. Other complaints:   Fibromyalgia: She feels like she has been worse in last few months, heat worsens.  Daily pain after yard work , house work.   Review of Systems  Constitutional: Positive for fatigue. Negative for fever.  HENT: Negative for ear pain.  Eyes: Negative for pain.  Respiratory: Negative for chest tightness and shortness of breath.  Cardiovascular: Negative for chest pain, palpitations and leg swelling.  Gastrointestinal: Negative for abdominal pain.  Genitourinary: Negative for dysuria.      Objective:  Physical Exam  Constitutional: Vital signs are normal. She appears well-developed and well-nourished. She is cooperative. Non-toxic appearance. She does not appear ill. No distress.  HENT:  Head: Normocephalic.  Right Ear: Hearing, tympanic membrane, external ear and ear canal  normal.  Left Ear: Hearing, tympanic membrane, external ear and ear canal normal.  Nose: Nose normal.  Eyes: Conjunctivae, EOM and lids are normal. Pupils are equal, round, and reactive to light. Lids are everted and swept, no foreign bodies found.  Neck: Trachea normal and normal range of motion. Neck supple. Carotid bruit is not present. No mass and no thyromegaly present.  Cardiovascular: Normal rate, regular rhythm, S1 normal, S2 normal, normal heart sounds and intact distal pulses. Exam reveals no gallop.  No murmur heard. Pulmonary/Chest: Effort normal and breath sounds normal. No respiratory distress. She has no wheezes. She has no rhonchi. She has no rales.  Abdominal: Soft. Normal appearance and bowel sounds are normal. She exhibits no distension, no fluid wave, no abdominal bruit and no mass. There is no hepatosplenomegaly. There is no tenderness. There is no rebound, no guarding and no CVA tenderness. No hernia.  Genitourinary: No breast swelling, tenderness, discharge or bleeding. Pelvic exam was performed with patient supine.  Lymphadenopathy:   She has no cervical adenopathy.   She has no axillary adenopathy.  Neurological: She is alert. She has normal strength. No cranial nerve deficit or sensory deficit.  Skin: Skin is warm, dry and intact. No rash noted.  Psychiatric: Judgment normal. Her mood appears not anxious. Her affect is blunt. She is slowed and withdrawn. Cognition and memory are normal. She does not exhibit a depressed mood. She expresses no suicidal plans and no homicidal plans.         Assessment & Plan:  The patient's preventative maintenance and recommended screening tests for an annual wellness exam  were reviewed in full today. Brought up to date unless services declined.  Counselled on the importance of diet, exercise, and its role in overall health and mortality. The patient's FH and SH was reviewed, including their home life, tobacco status,  and drug and alcohol status.   Partial hysterectomy,then BSO no pap neeeded, no DVE.  Nml Mammogram 02/2012, due,mother with breast cancer history.  SHE REFUSES AT THIS TIME. She is open to office breast exam. UptoDate with vaccines, gets flu vaccine at work.  Colon cancer screening:02/25/2009 nml, rpt in 10 years.      HCV will do today. nonsmoker

## 2015-04-20 NOTE — Patient Instructions (Addendum)
Get back to regular exercise.  Keep up working on healthy eating.  Stopa t lab on way out.

## 2015-04-20 NOTE — Assessment & Plan Note (Signed)
Likely due to fatty liver and statin. Will check Acute hep panel given need for HCV screening as well.

## 2015-04-20 NOTE — Assessment & Plan Note (Signed)
Stable control. 

## 2015-04-20 NOTE — Assessment & Plan Note (Signed)
Likely fibro flare, but will eval with labs to check on TSH and tick borne dis  Given other symptoms as well.

## 2015-04-21 ENCOUNTER — Other Ambulatory Visit: Payer: Self-pay | Admitting: Family Medicine

## 2015-04-21 LAB — HEPATITIS PANEL, ACUTE
HCV AB: NEGATIVE
HEP A IGM: NONREACTIVE
HEP B C IGM: NONREACTIVE
HEP B S AG: NEGATIVE

## 2015-04-21 LAB — LYME AB/WESTERN BLOT REFLEX: B burgdorferi Ab IgG+IgM: 0.14 {ISR}

## 2015-04-21 NOTE — Telephone Encounter (Signed)
Last office visit 04/20/2015.  Last refilled 03/27/2014 for #90 with 3 refills.  Ok to refill?

## 2015-04-22 LAB — ROCKY MTN SPOTTED FVR ABS PNL(IGG+IGM)
RMSF IGG: 0.06 IV
RMSF IgM: 0.11 IV

## 2015-04-24 LAB — EHRLICHIA ANTIBODY PANEL

## 2015-05-18 ENCOUNTER — Ambulatory Visit: Payer: 59 | Admitting: Family Medicine

## 2015-06-06 ENCOUNTER — Other Ambulatory Visit: Payer: Self-pay | Admitting: Family Medicine

## 2015-06-07 NOTE — Telephone Encounter (Signed)
Last office visit 04/20/2015.  Last refilled 11/26/2014 for #180 with 1 refill.  Ok to refill?

## 2015-07-01 ENCOUNTER — Telehealth: Payer: Self-pay

## 2015-07-01 ENCOUNTER — Ambulatory Visit (INDEPENDENT_AMBULATORY_CARE_PROVIDER_SITE_OTHER): Payer: 59 | Admitting: Family Medicine

## 2015-07-01 ENCOUNTER — Encounter: Payer: Self-pay | Admitting: Family Medicine

## 2015-07-01 VITALS — BP 100/64 | HR 73 | Temp 98.7°F | Ht 68.0 in | Wt 223.5 lb

## 2015-07-01 DIAGNOSIS — H9319 Tinnitus, unspecified ear: Secondary | ICD-10-CM | POA: Diagnosis not present

## 2015-07-01 DIAGNOSIS — F331 Major depressive disorder, recurrent, moderate: Secondary | ICD-10-CM

## 2015-07-01 DIAGNOSIS — F410 Panic disorder [episodic paroxysmal anxiety] without agoraphobia: Secondary | ICD-10-CM

## 2015-07-01 DIAGNOSIS — R1032 Left lower quadrant pain: Secondary | ICD-10-CM

## 2015-07-01 DIAGNOSIS — F411 Generalized anxiety disorder: Secondary | ICD-10-CM | POA: Insufficient documentation

## 2015-07-01 HISTORY — DX: Tinnitus, unspecified ear: H93.19

## 2015-07-01 HISTORY — DX: Panic disorder (episodic paroxysmal anxiety): F41.0

## 2015-07-01 LAB — POCT URINALYSIS DIP (MANUAL ENTRY)
BILIRUBIN UA: NEGATIVE
Bilirubin, UA: NEGATIVE
Glucose, UA: NEGATIVE
Leukocytes, UA: NEGATIVE
Nitrite, UA: NEGATIVE
PH UA: 6
PROTEIN UA: NEGATIVE
RBC UA: NEGATIVE
SPEC GRAV UA: 1.02
UROBILINOGEN UA: 0.2

## 2015-07-01 MED ORDER — VENLAFAXINE HCL ER 37.5 MG PO CP24: ORAL_CAPSULE | ORAL | Status: DC

## 2015-07-01 NOTE — Assessment & Plan Note (Signed)
Likely contributing to all her somatic complaints.  Start venlafaxine, taper up.

## 2015-07-01 NOTE — Assessment & Plan Note (Signed)
Some evidence of hearing loss, inadequate exam. Refer to ENT for further eval.

## 2015-07-01 NOTE — Patient Instructions (Addendum)
Start venlafaxine 37.5 mg daily x 2 week, if tolerating increase to 75 mg daily. Stop at front desk to set up referral  to ENT.  Follow up in 1 month, 30 min.

## 2015-07-01 NOTE — Progress Notes (Signed)
Pre visit review using our clinic review tool, if applicable. No additional management support is needed unless otherwise documented below in the visit note. 

## 2015-07-01 NOTE — Telephone Encounter (Signed)
Pt said ins would only approve # 30 for venlafaxine; pt wants to know if [facility] day [facility]; advised pt she is to increase to 2 aday after one week if tolerating well. Pt will ck with Dr Ermalene SearingBedsole at one month f/u about getting 90 day rx.

## 2015-07-01 NOTE — Progress Notes (Signed)
Subjective:    Patient ID: Mia Kline, female    DOB: 02/16/1956, 59 y.o.   MRN: [id number]   HPI   59 year old female presents for follow up of multiple issues.  1.pain in left lower side, radiates to left lower back, ache, off and on for 2-3 months. Low back is worse with movement.  No fall, no known injury.  No pain with crossing legs. No change with eating.  No diarrhea or constipation associated. No fever. No dysuria.  Advil helps some.   2. Noise in ears bilaterally. Buzzing sound, constant for years,. NO heartbeat.  Keeps her up at night. ? Last hearing tests.  No change in hearing. Listened to loud music when younger, wears ear plugs with riding motorcycle.  ? Associated with her chronic atypical headache. Likely migraines per neuro Dr. Modesto Charon.  3. Episode last month of pain in left shoulder. Left upper arm. Felt like heart pumping. Last few hours . Got better when she went to sleep and took advil PM. Nml BP No chest pain with exertion.  4.Poor memory. Had memory eval in past with neuro.  5. Occ panic attacks, few times a week. Sadness, depression, poor control. INsomnia as well. NO SI, no HI.  2012 Cymbalta did not help in past.  Amitryptiline cause SE.  SE to SSRI in past. Wellbutrin in past hives   Will hold on other issues for now.  Social History /Family History/Past Medical History reviewed and updated if needed.   Colon: diverticulosis  Review of Systems  Constitutional: Negative for fever and fatigue.  HENT: Negative for ear pain.   Eyes: Negative for pain.  Respiratory: Negative for chest tightness and shortness of breath.   Cardiovascular: Positive for chest pain. Negative for palpitations and leg swelling.  Gastrointestinal: Positive for abdominal pain. Negative for nausea, blood in stool and abdominal distention.  Genitourinary: Negative for dysuria.  Musculoskeletal: Positive for arthralgias.  Skin: Negative for rash.  Neurological:  Positive for headaches.       Objective:   Physical Exam  Constitutional: Vital signs are normal. She appears well-developed and well-nourished. She is cooperative.  Non-toxic appearance. She does not appear ill. No distress.  Obese female in NAD  HENT:  Head: Normocephalic.  Right Ear: Hearing, tympanic membrane, external ear and ear canal normal. Tympanic membrane is not erythematous, not retracted and not bulging.  Left Ear: Hearing, tympanic membrane, external ear and ear canal normal. Tympanic membrane is not erythematous, not retracted and not bulging.  Nose: No mucosal edema or rhinorrhea. Right sinus exhibits no maxillary sinus tenderness and no frontal sinus tenderness. Left sinus exhibits no maxillary sinus tenderness and no frontal sinus tenderness.  Mouth/Throat: Uvula is midline, oropharynx is clear and moist and mucous membranes are normal.  Eyes: Conjunctivae, EOM and lids are normal. Pupils are equal, round, and reactive to light. Lids are everted and swept, no foreign bodies found.  Neck: Trachea normal and normal range of motion. Neck supple. Carotid bruit is not present. No thyroid mass and no thyromegaly present.  Cardiovascular: Normal rate, regular rhythm, S1 normal, S2 normal, normal heart sounds, intact distal pulses and normal pulses.  Exam reveals no gallop and no friction rub.   No murmur heard. Pulmonary/Chest: Effort normal and breath sounds normal. No tachypnea. No respiratory distress. She has no decreased breath sounds. She has no wheezes. She has no rhonchi. She has no rales.  Abdominal: Soft. Normal appearance and bowel sounds are normal.  There is no hepatosplenomegaly. There is tenderness in the left lower quadrant. There is no rigidity, no rebound, no guarding and no CVA tenderness.  Mild ttp  Musculoskeletal:       Lumbar back: Normal. She exhibits normal range of motion and no tenderness.  Neg SLR, neg faber's, no anterior hip pain  Neurological: She is  alert.  Skin: Skin is warm, dry and intact. No rash noted.  Psychiatric: Her speech is normal. Judgment and thought content normal. Her mood appears not anxious. Her affect is blunt. She is withdrawn. Cognition and memory are normal. She exhibits a depressed mood. She expresses no suicidal plans and no homicidal plans.          Assessment & Plan:

## 2015-07-01 NOTE — Assessment & Plan Note (Signed)
No sign of UTI or stones, pt S/P hysterectomy.  May be secondary to chronic low back pain. ? Functional or IBS like pain but not typical. No D/C. Hx of diverticulosis on colonoscopy... Not clearly diverticulitis. Offered CT abd pelvis.. Pt refused.. Says makes her cough. Offered US abd.. Refused for now.

## 2015-07-01 NOTE — Assessment & Plan Note (Signed)
Treat with stress reduction, relaxation, continue exercise. Offered temporary rx of anxiolytic, not interested. Will start with venlafaxine.

## 2015-07-02 MED ORDER — VENLAFAXINE HCL ER 75 MG PO CP24: 75.0000 mg | ORAL_CAPSULE | Freq: Every day | ORAL | Status: DC

## 2015-07-02 NOTE — Telephone Encounter (Addendum)
Per Dr. Christel MormonBedsole-Okay to go ahead and send Rx for Venalafaxine XR 75 [facility].  [facility] notified.

## 2015-07-02 NOTE — Addendum Note (Signed)
Addended by: Damita LackLORING, DONNA S on: 07/02/2015 03:13 PM   Modules accepted: Orders

## 2015-07-09 ENCOUNTER — Telehealth: Payer: Self-pay

## 2015-07-09 NOTE — Telephone Encounter (Signed)
Pt received venlafaxine from mail order pharmacy and pt understood was to take med at nighttime. Instructions on bottle are to take med at breakfast. Pt request cb with clarification of instructions.

## 2015-07-09 NOTE — Telephone Encounter (Signed)
Mia Kline notified okay to take Venlafaxine at night.  Main thing is to be consist ant when taking it.  Patient states understanding.

## 2015-07-20 ENCOUNTER — Other Ambulatory Visit: Payer: Self-pay

## 2015-07-29 ENCOUNTER — Other Ambulatory Visit: Payer: Self-pay | Admitting: Family Medicine

## 2015-08-03 ENCOUNTER — Encounter: Payer: Self-pay | Admitting: Family Medicine

## 2015-08-10 ENCOUNTER — Ambulatory Visit: Payer: 59 | Admitting: Family Medicine

## 2015-08-12 ENCOUNTER — Ambulatory Visit (INDEPENDENT_AMBULATORY_CARE_PROVIDER_SITE_OTHER): Payer: 59 | Admitting: Family Medicine

## 2015-08-12 ENCOUNTER — Encounter: Payer: Self-pay | Admitting: Family Medicine

## 2015-08-12 VITALS — BP 113/80 | HR 79 | Temp 98.5°F | Ht 68.0 in | Wt 224.0 lb

## 2015-08-12 DIAGNOSIS — F331 Major depressive disorder, recurrent, moderate: Secondary | ICD-10-CM

## 2015-08-12 DIAGNOSIS — F5104 Psychophysiologic insomnia: Secondary | ICD-10-CM

## 2015-08-12 DIAGNOSIS — G4700 Insomnia, unspecified: Secondary | ICD-10-CM

## 2015-08-12 DIAGNOSIS — F410 Panic disorder [episodic paroxysmal anxiety] without agoraphobia: Secondary | ICD-10-CM

## 2015-08-12 NOTE — Assessment & Plan Note (Signed)
Improved, but pt now with decreased motivation ( I believe this is more the depression than med SE) Recommended increase in medication. Pt is hesitant given past SE at higher doses of other meds. Will continue current dose for now.

## 2015-08-12 NOTE — Assessment & Plan Note (Signed)
Try melatonin for sleep

## 2015-08-12 NOTE — Patient Instructions (Signed)
Continue current dose of venlafaxine.  Call if interested in increase the dose.  Work on self motivation, regular exercise as tolerated.  Can try melatonin for sleep 3-10 mg at bedtime.

## 2015-08-12 NOTE — Assessment & Plan Note (Signed)
Resolved on velafaxine.

## 2015-08-12 NOTE — Progress Notes (Signed)
Subjective:    Patient ID: Mia Kline, female    DOB: [birth year], 60 y.o.   MRN: [id number]  HPI  60 year old female with multiple medical issues presents for 21 month follow up.   At last OV she started venlafaxine for major depressive disorder.  Felt that poor control of this was related to her multiple somatic complaints.  Today she reports:  No SE on venlafaxine 75 mg daily.She has less sadness, she now has no motivation. Feels unemotional. No current panic attacks.  She stays home, does not do much.  No SI. No HI.  No SE.  Still some issues staying asleep, no issue falling asleep. Wakes up early. Some better with venlafaxine. Aleve PM helps some.  No change in memory.  Still with less frequent LLQ  and low back pain.. Off and on.  Low back was sore and knees spore after shoveling.  Wt Readings from Last 3 Encounters:  08/12/15 224 lb (101.606 kg)  07/01/15 223 lb 8 oz (101.379 kg)  04/20/15 220 lb 4 oz (99.905 kg)        Review of Systems  Constitutional: Negative for fever and fatigue.  HENT: Negative for ear pain.   Eyes: Negative for pain.  Respiratory: Negative for chest tightness and shortness of breath.   Cardiovascular: Negative for chest pain, palpitations and leg swelling.  Gastrointestinal: Negative for abdominal pain.  Genitourinary: Negative for dysuria.       Objective:   Physical Exam  Constitutional: Vital signs are normal. She appears well-developed and well-nourished. She is cooperative.  Non-toxic appearance. She does not appear ill. No distress.  Obese female in NAD  HENT:  Head: Normocephalic.  Right Ear: Hearing, tympanic membrane, external ear and ear canal normal. Tympanic membrane is not erythematous, not retracted and not bulging.  Left Ear: Hearing, tympanic membrane, external ear and ear canal normal. Tympanic membrane is not erythematous, not retracted and not bulging.  Nose: No mucosal edema or rhinorrhea. Right sinus  exhibits no maxillary sinus tenderness and no frontal sinus tenderness. Left sinus exhibits no maxillary sinus tenderness and no frontal sinus tenderness.  Mouth/Throat: Uvula is midline, oropharynx is clear and moist and mucous membranes are normal.  Eyes: Conjunctivae, EOM and lids are normal. Pupils are equal, round, and reactive to light. Lids are everted and swept, no foreign bodies found.  Neck: Trachea normal and normal range of motion. Neck supple. Carotid bruit is not present. No thyroid mass and no thyromegaly present.  Cardiovascular: Normal rate, regular rhythm, S1 normal, S2 normal, normal heart sounds, intact distal pulses and normal pulses.  Exam reveals no gallop and no friction rub.   No murmur heard. Pulmonary/Chest: Effort normal and breath sounds normal. No tachypnea. No respiratory distress. She has no decreased breath sounds. She has no wheezes. She has no rhonchi. She has no rales.  Abdominal: Soft. Normal appearance and bowel sounds are normal. There is no hepatosplenomegaly. There is tenderness in the left lower quadrant. There is no rigidity, no rebound, no guarding and no CVA tenderness.  Mild ttp  Musculoskeletal:       Lumbar back: Normal. She exhibits normal range of motion and no tenderness.  Neg SLR, neg faber's, no anterior hip pain  Neurological: She is alert.  Skin: Skin is warm, dry and intact. No rash noted.  Psychiatric: Her speech is normal. Judgment and thought content normal. Her mood appears not anxious. Her affect is blunt. She is withdrawn. Cognition and  memory are normal. She does not exhibit a depressed mood. She expresses no suicidal plans and no homicidal plans.  Flat affect          Assessment & Plan:

## 2015-08-12 NOTE — Progress Notes (Signed)
Pre visit review using our clinic review tool, if applicable. No additional management support is needed unless otherwise documented below in the visit note. 

## 2015-09-01 ENCOUNTER — Other Ambulatory Visit: Payer: Self-pay | Admitting: *Deleted

## 2015-09-01 MED ORDER — DICLOFENAC SODIUM 75 MG PO TBEC: 75.0000 mg | DELAYED_RELEASE_TABLET | Freq: Two times a day (BID) | ORAL | Status: DC

## 2015-09-01 MED ORDER — SIMVASTATIN 20 MG PO TABS: 20.0000 mg | ORAL_TABLET | Freq: Every day | ORAL | Status: DC

## 2015-09-21 ENCOUNTER — Telehealth: Payer: Self-pay

## 2015-09-21 NOTE — Telephone Encounter (Signed)
Pt was seen on 08/12/15 and per AVS pt was to cb if wants to increase the venlafaxine XR 75 mg dosage; pt does want to increase the dosage and have new rx sent to [facility]. Please advise.

## 2015-09-23 MED ORDER — VENLAFAXINE HCL ER 150 MG PO CP24: 150.0000 mg | ORAL_CAPSULE | Freq: Every day | ORAL | Status: DC

## 2015-09-23 NOTE — Telephone Encounter (Signed)
Left message for Weltha that a new Rx for Venlafaxine XR 150 mg has been sent into [facility].

## 2015-09-23 NOTE — Telephone Encounter (Signed)
Increased dose to 150 and rx [facility]

## 2015-10-11 ENCOUNTER — Other Ambulatory Visit: Payer: Self-pay | Admitting: Family Medicine

## 2015-12-25 ENCOUNTER — Other Ambulatory Visit: Payer: Self-pay | Admitting: Family Medicine

## 2016-01-20 ENCOUNTER — Other Ambulatory Visit: Payer: Self-pay | Admitting: Family Medicine

## 2016-01-21 ENCOUNTER — Other Ambulatory Visit: Payer: Self-pay | Admitting: Family Medicine

## 2016-01-21 ENCOUNTER — Other Ambulatory Visit: Payer: Self-pay | Admitting: *Deleted

## 2016-01-21 MED ORDER — HYDROCHLOROTHIAZIDE 25 MG PO TABS: ORAL_TABLET | ORAL | Status: DC

## 2016-02-03 ENCOUNTER — Other Ambulatory Visit: Payer: Self-pay | Admitting: Family Medicine

## 2016-02-04 NOTE — Telephone Encounter (Signed)
Last office visit 08/12/2015.  Last refilled 01/21/2016 for #90 with no refills.  Refill?

## 2016-04-18 ENCOUNTER — Other Ambulatory Visit (INDEPENDENT_AMBULATORY_CARE_PROVIDER_SITE_OTHER): Payer: 59

## 2016-04-18 ENCOUNTER — Telehealth: Payer: Self-pay | Admitting: Family Medicine

## 2016-04-18 DIAGNOSIS — E785 Hyperlipidemia, unspecified: Secondary | ICD-10-CM | POA: Diagnosis not present

## 2016-04-18 DIAGNOSIS — D509 Iron deficiency anemia, unspecified: Secondary | ICD-10-CM

## 2016-04-18 LAB — LIPID PANEL
CHOLESTEROL: 192 mg/dL (ref 0–200)
HDL: 66.3 mg/dL (ref >39.00)
LDL Cholesterol: 92 mg/dL (ref 0–99)
NONHDL: 125.73
TRIGLYCERIDES: 168 mg/dL — AB (ref 0.0–149.0)
Total CHOL/HDL Ratio: 3
VLDL: 33.6 mg/dL (ref 0.0–40.0)

## 2016-04-18 LAB — CBC WITH DIFFERENTIAL/PLATELET
BASOS ABS: 0 10*3/uL (ref 0.0–0.1)
Basophils Relative: 0.6 % (ref 0.0–3.0)
EOS PCT: 0.1 % (ref 0.0–5.0)
Eosinophils Absolute: 0 10*3/uL (ref 0.0–0.7)
HEMATOCRIT: 34.8 % — AB (ref 36.0–46.0)
Hemoglobin: 11.2 g/dL — ABNORMAL LOW (ref 12.0–15.0)
LYMPHS ABS: 1.9 10*3/uL (ref 0.7–4.0)
LYMPHS PCT: 34.7 % (ref 12.0–46.0)
MCHC: 32.1 g/dL (ref 30.0–36.0)
MCV: 70.7 fl — AB (ref 78.0–100.0)
MONOS PCT: 7.8 % (ref 3.0–12.0)
Monocytes Absolute: 0.4 10*3/uL (ref 0.1–1.0)
NEUTROS PCT: 56.8 % (ref 43.0–77.0)
Neutro Abs: 3.1 10*3/uL (ref 1.4–7.7)
Platelets: 239 10*3/uL (ref 150.0–400.0)
RBC: 4.92 Mil/uL (ref 3.87–5.11)
RDW: 18.4 % — ABNORMAL HIGH (ref 11.5–15.5)
WBC: 5.4 10*3/uL (ref 4.0–10.5)

## 2016-04-18 LAB — COMPREHENSIVE METABOLIC PANEL
ALBUMIN: 4.1 g/dL (ref 3.5–5.2)
ALK PHOS: 59 U/L (ref 39–117)
ALT: 33 U/L (ref 0–35)
AST: 23 U/L (ref 0–37)
BILIRUBIN TOTAL: 0.3 mg/dL (ref 0.2–1.2)
BUN: 17 mg/dL (ref 6–23)
CALCIUM: 8.6 mg/dL (ref 8.4–10.5)
CO2: 31 mEq/L (ref 19–32)
Chloride: 103 mEq/L (ref 96–112)
Creatinine, Ser: 0.79 mg/dL (ref 0.40–1.20)
GFR: 78.81 mL/min (ref >60.00)
GLUCOSE: 93 mg/dL (ref 70–99)
POTASSIUM: 4.1 meq/L (ref 3.5–5.1)
Sodium: 139 mEq/L (ref 135–145)
TOTAL PROTEIN: 6.6 g/dL (ref 6.0–8.3)

## 2016-04-18 NOTE — Telephone Encounter (Signed)
-----   Message from Baldomero LamyNatasha C Chavers sent at 04/14/2016  2:29 PM EDT ----- Regarding: Cpx labs Tues 9/19, need orders. Thanks! :-) Please order  future cpx labs for pt's upcoming lab appt. Thanks Rodney Boozeasha

## 2016-04-20 ENCOUNTER — Other Ambulatory Visit: Payer: Self-pay | Admitting: Family Medicine

## 2016-04-20 NOTE — Telephone Encounter (Signed)
Last office visit 08/12/2015.  CPE scheduled 04/25/2016.  Last refilled 01/21/2016 for #180 with no refills.

## 2016-04-25 ENCOUNTER — Encounter: Payer: Self-pay | Admitting: Family Medicine

## 2016-04-25 ENCOUNTER — Ambulatory Visit (INDEPENDENT_AMBULATORY_CARE_PROVIDER_SITE_OTHER): Payer: 59 | Admitting: Family Medicine

## 2016-04-25 VITALS — BP 120/72 | HR 86 | Temp 98.5°F | Ht 67.75 in | Wt 226.5 lb

## 2016-04-25 DIAGNOSIS — Z0000 Encounter for general adult medical examination without abnormal findings: Secondary | ICD-10-CM

## 2016-04-25 DIAGNOSIS — F331 Major depressive disorder, recurrent, moderate: Secondary | ICD-10-CM

## 2016-04-25 DIAGNOSIS — D509 Iron deficiency anemia, unspecified: Secondary | ICD-10-CM | POA: Diagnosis not present

## 2016-04-25 DIAGNOSIS — E785 Hyperlipidemia, unspecified: Secondary | ICD-10-CM

## 2016-04-25 DIAGNOSIS — I10 Essential (primary) hypertension: Secondary | ICD-10-CM | POA: Diagnosis not present

## 2016-04-25 DIAGNOSIS — M797 Fibromyalgia: Secondary | ICD-10-CM

## 2016-04-25 NOTE — Progress Notes (Signed)
Pre visit review using our clinic review tool, if applicable. No additional management support is needed unless otherwise documented below in the visit note. 

## 2016-04-25 NOTE — Patient Instructions (Addendum)
Exercise as able, consider water exercise.  Increase iron in diet.  Get mammogram done.  Get flu shot at work.  Look into shingles vaccine.   Make follow up if having other issue such as body pain to discuss if needed.

## 2016-04-25 NOTE — Assessment & Plan Note (Signed)
Well controlled. Continue current medication.  

## 2016-04-25 NOTE — Progress Notes (Signed)
The patient is here for annual wellness exam and preventative care.   She has multiple chronic health issues. Fibromyalgia. We will discuss at later time if needed. Pt agreeable.  Anemia, iron def: Hg 11.2, low MCV.  Cannot take due to constipation  Hypertension: Good control today on HCTZ Chest pain with exertion:None Edema:None Short of breath:None Average home BPs: not checking. BP Readings from Last 3 Encounters:  04/25/16 120/72  08/12/15 113/80  07/01/15 100/64   She has not been exercising.  Wt Readings from Last 3 Encounters:  04/25/16 226 lb 8 oz (102.7 kg)  08/12/15 224 lb (101.6 kg)  07/01/15 223 lb 8 oz (101.4 kg)   Elevated Cholesterol: LDL and triglycerides At goal LDL < 130 on simvastatin. Lab Results  Component Value Date   CHOL 192 04/18/2016   HDL 66.30 04/18/2016   LDLCALC 92 04/18/2016   LDLDIRECT 207.8 03/25/2013   TRIG 168.0 (H) 04/18/2016   CHOLHDL 3 04/18/2016  Tolerating medication. Diet compliance: Good Exercise:  None Other complaints:   Social History /Family History/Past Medical History reviewed and updated if needed. .  Review of Systems  Constitutional: Positive for fatigue. Negative for fever.  HENT: Negative for ear pain.  Eyes: Negative for pain.  Respiratory: Negative for chest tightness and shortness of breath.  Cardiovascular: Negative for chest pain, palpitations and leg swelling.  Gastrointestinal: Negative for abdominal pain.  Genitourinary: Negative for dysuria.      Objective:  Physical Exam  Constitutional: Vital signs are normal. She appears well-developed and well-nourished. She is cooperative. Non-toxic appearance. She does not appear ill. No distress.  HENT:  Head: Normocephalic.  Right Ear: Hearing, tympanic membrane, external ear and ear canal normal.  Left Ear: Hearing, tympanic membrane, external ear and ear canal normal.  Nose: Nose normal.  Eyes: Conjunctivae, EOM and lids  are normal. Pupils are equal, round, and reactive to light. Lids are everted and swept, no foreign bodies found.  Neck: Trachea normal and normal range of motion. Neck supple. Carotid bruit is not present. No mass and no thyromegaly present.  Cardiovascular: Normal rate, regular rhythm, S1 normal, S2 normal, normal heart sounds and intact distal pulses. Exam reveals no gallop.  No murmur heard. Pulmonary/Chest: Effort normal and breath sounds normal. No respiratory distress. She has no wheezes. She has no rhonchi. She has no rales.  Abdominal: Soft. Normal appearance and bowel sounds are normal. She exhibits no distension, no fluid wave, no abdominal bruit and no mass. There is no hepatosplenomegaly. There is no tenderness. There is no rebound, no guarding and no CVA tenderness. No hernia.  Genitourinary: No breast swelling, tenderness, discharge or bleeding. NO PAP< N DVE.  Lymphadenopathy:   She has no cervical adenopathy.   She has no axillary adenopathy.  Neurological: She is alert. She has normal strength. No cranial nerve deficit or sensory deficit.  Skin: Skin is warm, dry and intact. No rash noted.  Psychiatric: Judgment normal. Her mood appears not anxious. Her affect is blunt. She is slowed and withdrawn. Cognition and memory are normal. She does not exhibit a depressed mood. She expresses no suicidal plans and no homicidal plans.         Assessment & Plan:  The patient's preventative maintenance and recommended screening tests for an annual wellness exam were reviewed in full today. Brought up to date unless services declined.  Counselled on the importance of diet, exercise, and its role in overall health and mortality. The patient's FH and  SH was reviewed, including their home life, tobacco status, and drug and alcohol status.   Partial hysterectomy,then BSO no pap neeeded, no DVE.  Nml Mammogram 02/2012, due,mother and father with breast cancer history.   SHE REFUSES AT THIS TIME. She is open to office breast exam. Up to Date with vaccines, gets flu vaccine at work, shingles vaccco Colon cancer screening:02/25/2009 nml, rpt in 10 years.      HCV  Neg. Nonsmoker

## 2016-04-25 NOTE — Assessment & Plan Note (Signed)
Cannot tolerate iron.. Increase iron in diet.

## 2016-04-25 NOTE — Assessment & Plan Note (Signed)
Stable control on current meds. 

## 2016-04-25 NOTE — Assessment & Plan Note (Signed)
Return to discuss in detail.

## 2016-05-10 ENCOUNTER — Other Ambulatory Visit: Payer: Self-pay | Admitting: Family Medicine

## 2016-05-22 ENCOUNTER — Other Ambulatory Visit: Payer: Self-pay | Admitting: Family Medicine

## 2016-05-24 ENCOUNTER — Ambulatory Visit (INDEPENDENT_AMBULATORY_CARE_PROVIDER_SITE_OTHER): Payer: Self-pay | Admitting: Orthopedic Surgery

## 2016-06-11 ENCOUNTER — Other Ambulatory Visit: Payer: Self-pay | Admitting: Family Medicine

## 2016-09-13 ENCOUNTER — Other Ambulatory Visit: Payer: Self-pay | Admitting: *Deleted

## 2016-09-13 MED ORDER — DICLOFENAC SODIUM 75 MG PO TBEC: 75.0000 mg | DELAYED_RELEASE_TABLET | Freq: Two times a day (BID) | ORAL | 0 refills | Status: DC

## 2016-09-13 MED ORDER — VENLAFAXINE HCL ER 150 MG PO CP24: ORAL_CAPSULE | ORAL | 1 refills | Status: DC

## 2016-09-13 NOTE — Telephone Encounter (Signed)
Last office visit 04/25/2016.  Last refilled 06/11/2016 for #180 with no refills.  Ok to refill?

## 2016-10-26 ENCOUNTER — Ambulatory Visit (INDEPENDENT_AMBULATORY_CARE_PROVIDER_SITE_OTHER): Payer: 59 | Admitting: Family Medicine

## 2016-10-26 ENCOUNTER — Encounter (INDEPENDENT_AMBULATORY_CARE_PROVIDER_SITE_OTHER): Payer: Self-pay

## 2016-10-26 ENCOUNTER — Encounter: Payer: Self-pay | Admitting: Family Medicine

## 2016-10-26 VITALS — BP 136/86 | HR 92 | Temp 98.4°F | Wt 240.0 lb

## 2016-10-26 DIAGNOSIS — R053 Chronic cough: Secondary | ICD-10-CM

## 2016-10-26 DIAGNOSIS — M797 Fibromyalgia: Secondary | ICD-10-CM

## 2016-10-26 DIAGNOSIS — R05 Cough: Secondary | ICD-10-CM

## 2016-10-26 DIAGNOSIS — M2550 Pain in unspecified joint: Secondary | ICD-10-CM

## 2016-10-26 LAB — TSH: TSH: 1.8 m[IU]/L

## 2016-10-26 MED ORDER — PANTOPRAZOLE SODIUM 40 MG PO TBEC: 40.0000 mg | DELAYED_RELEASE_TABLET | Freq: Every day | ORAL | 3 refills | Status: DC

## 2016-10-26 NOTE — Progress Notes (Signed)
Subjective:    Patient ID: Mia Kline, female    DOB: Jul 12, 1956, 61 y.o.   MRN: [id number]  HPI    61 year old female with fibromyalgia presents with several weeks of increased body/ joint pain. Pain in hips bilateral and in tailbone.  Pain in shoulders, elbow.  No swelling , no redness.  No change in activity.  Not stiff. Pain with walking , makes worse.  Had tick bite  2 weeks before started having body ache. No rash.   For fibromylagia: Cymbalta worked well but could not afford. Amitryptiline did not help and cause SE.  She is currently on high dose effexor Tried diclofenac, celebrex, voltaren gel.. minimal relief. Has used tramadol for breakthrough pain in past.   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 Dr. August Saucerean follows.. Was doing hyaluronic acid but was expensive.   chronic cough:   She has also noted dry cough, in spells. Tickle in throat. Ongoing x  Months, maybe a year. She does have post nasal drip.  NO SOB, no wheezing. No fever. Always sucking on halls. Occ feels like food sticks in throat. No heartburn on omeprazole 40 mg daily.     Review of Systems  Constitutional: Positive for fatigue. Negative for fever.  HENT: Negative for ear pain.   Eyes: Negative for pain.  Respiratory: Negative for chest tightness and shortness of breath.   Cardiovascular: Negative for chest pain, palpitations and leg swelling.  Gastrointestinal: Negative for abdominal pain.  Genitourinary: Negative for dysuria.       Objective:   Physical Exam  Constitutional: Vital signs are normal. She appears well-developed and well-nourished. She is cooperative.  Non-toxic appearance. She does not appear ill. No distress.  HENT:  Head: Normocephalic.  Right Ear: Hearing, tympanic membrane, external ear and ear canal normal. Tympanic membrane is not erythematous, not retracted and not bulging.  Left Ear: Hearing, tympanic membrane, external ear and ear canal normal. Tympanic membrane is  not erythematous, not retracted and not bulging.  Nose: No mucosal edema or rhinorrhea. Right sinus exhibits no maxillary sinus tenderness and no frontal sinus tenderness. Left sinus exhibits no maxillary sinus tenderness and no frontal sinus tenderness.  Mouth/Throat: Uvula is midline, oropharynx is clear and moist and mucous membranes are normal.  Eyes: Conjunctivae, EOM and lids are normal. Pupils are equal, round, and reactive to light. Lids are everted and swept, no foreign bodies found.  Neck: Trachea normal and normal range of motion. Neck supple. Carotid bruit is not present. No thyroid mass and no thyromegaly present.  Cardiovascular: Normal rate, regular rhythm, S1 normal, S2 normal, normal heart sounds, intact distal pulses and normal pulses.  Exam reveals no gallop and no friction rub.   No murmur heard. Pulmonary/Chest: Effort normal and breath sounds normal. No tachypnea. No respiratory distress. She has no decreased breath sounds. She has no wheezes. She has no rhonchi. She has no rales.  Abdominal: Soft. Normal appearance and bowel sounds are normal. There is no tenderness.  Musculoskeletal:       Right hip: She exhibits decreased strength and bony tenderness.       Left hip: She exhibits decreased range of motion and bony tenderness.       Right knee: She exhibits decreased range of motion and swelling. Tenderness found. Medial joint line and lateral joint line tenderness noted.       Left knee: She exhibits decreased range of motion and swelling. Tenderness found. Medial joint line and lateral  joint line tenderness noted.       Lumbar back: She exhibits decreased range of motion and tenderness.  Neurological: She is alert. No cranial nerve deficit or sensory deficit.  Skin: Skin is warm, dry and intact. No rash noted.  Psychiatric: Her speech is normal and behavior is normal. Judgment and thought content normal. Her mood appears not anxious. Cognition and memory are normal. She  does not exhibit a depressed mood.          Assessment & Plan:

## 2016-10-26 NOTE — Progress Notes (Signed)
Pre visit review using our clinic review tool, if applicable. No additional management support is needed unless otherwise documented below in the visit note. 

## 2016-10-26 NOTE — Patient Instructions (Addendum)
Please stop at the lab to set up to have labs drawn. Change to pantoprazole 40 mg daily , stop omeprazole.  Avoid caffeine, tomatos, soda, chocloate, alcohol. Start allegra or Xyzal  at bedtime to treat allergy as possibility of cause of cough.

## 2016-10-27 LAB — CYCLIC CITRUL PEPTIDE ANTIBODY, IGG: Cyclic Citrullin Peptide Ab: <16 Units

## 2016-10-27 LAB — LYME AB/WESTERN BLOT REFLEX: B burgdorferi Ab IgG+IgM: <0.9 Index (ref <0.90)

## 2016-10-27 LAB — C-REACTIVE PROTEIN: CRP: 1 mg/L (ref <8.0)

## 2016-10-27 LAB — SEDIMENTATION RATE: SED RATE: 4 mm/h (ref 0–30)

## 2016-10-27 LAB — RHEUMATOID FACTOR

## 2016-10-28 LAB — EHRLICHIA ANTIBODY PANEL: E chaffeensis (HGE) Ab, IgM: <1:20 {titer}

## 2016-10-29 LAB — ROCKY MTN SPOTTED FVR ABS PNL(IGG+IGM)
RMSF IgG: NOT DETECTED
RMSF IgM: NOT DETECTED

## 2016-10-30 LAB — ANA: Anti Nuclear Antibody(ANA): NEGATIVE

## 2016-10-31 ENCOUNTER — Telehealth: Payer: Self-pay | Admitting: Family Medicine

## 2016-10-31 MED ORDER — GABAPENTIN 100 MG PO CAPS: 100.0000 mg | ORAL_CAPSULE | Freq: Every day | ORAL | 3 refills | Status: DC

## 2016-10-31 NOTE — Telephone Encounter (Signed)
Ms. Mia Kline notified as instructed by telephone.

## 2016-10-31 NOTE — Telephone Encounter (Signed)
Will start low dose at bedtime.  Pt needs to keep follow up in 1 month as scheduled.  I sent it locally [facility] of [facility] to make sure she tolerates it. If she does we can then change to [facility].

## 2016-10-31 NOTE — Telephone Encounter (Signed)
-----   Message from Damita Lack, CMA sent at 10/31/2016  9:51 AM EDT ----- Ms. Keshishyan notified as instructed by telephone.  She would like to try a trial of Gabapentin.  Please send Rx to [facility].

## 2016-11-14 ENCOUNTER — Other Ambulatory Visit: Payer: Self-pay | Admitting: Family Medicine

## 2016-11-24 ENCOUNTER — Ambulatory Visit (INDEPENDENT_AMBULATORY_CARE_PROVIDER_SITE_OTHER): Payer: 59 | Admitting: Family Medicine

## 2016-11-24 ENCOUNTER — Encounter: Payer: Self-pay | Admitting: Family Medicine

## 2016-11-24 DIAGNOSIS — M5441 Lumbago with sciatica, right side: Secondary | ICD-10-CM | POA: Diagnosis not present

## 2016-11-24 DIAGNOSIS — M5442 Lumbago with sciatica, left side: Secondary | ICD-10-CM | POA: Diagnosis not present

## 2016-11-24 DIAGNOSIS — M797 Fibromyalgia: Secondary | ICD-10-CM

## 2016-11-24 MED ORDER — PREDNISONE 20 MG PO TABS: ORAL_TABLET | ORAL | 0 refills | Status: DC

## 2016-11-24 NOTE — Progress Notes (Signed)
Pre visit review using our clinic review tool, if applicable. No additional management support is needed unless otherwise documented below in the visit note. 

## 2016-11-24 NOTE — Patient Instructions (Addendum)
We will call you to set up an appt to return for lumbar spine film.  Stop diclofenac, stop aleve while on prednsione.  Complete a prednisone taper.  Start home PT for sciatica.  Hold gabapentin for now, but keep for possible use in future.

## 2016-11-24 NOTE — Progress Notes (Signed)
Subjective:    Patient ID: Mia Kline, female    DOB: 1955-08-16, 61 y.o.   MRN: [id number]  HPI    61 year old female presents for 4 week follow up of fibromyalgia, diffuse body pain.. Neg tick borne  Test, neg autoimmune panel.    She has pain in hips, shoulders, knees,ankles. Overall body pain is not terrible at this point.  She has knee pain from OA.Marland Kitchen Plans to return to Dr. August Saucer.  Pain is worst in low back and buttocks.  Ongoing in last several months. Radiates from buttock to bilateral legs. Jolt pain shock like down legs Some days worse than others. Legs feels heavy and weak. Aleve prn As well as diclofenac.   No known fall recently.. Remote fracture tailbone.   SUMMARY: For fibromylagia: Cymbalta worked well but could not afford. Amitryptiline did not help and cause SE.  She is currently on high dose effexor Tried diclofenac, celebrex, voltaren gel.. minimal relief. Has used tramadol for breakthrough pain in past.   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 Dr. August Saucer follows.. Was doing hyaluronic acid but was expensive.  .    Review of Systems  Constitutional: Positive for fatigue. Negative for fever.  HENT: Negative for ear pain.   Eyes: Negative for pain.  Respiratory: Negative for chest tightness and shortness of breath.   Cardiovascular: Negative for chest pain, palpitations and leg swelling.  Gastrointestinal: Negative for abdominal pain.  Genitourinary: Negative for dysuria.       Objective:   Physical Exam  Constitutional: Vital signs are normal. She appears well-developed and well-nourished. She is cooperative.  Non-toxic appearance. She does not appear ill. No distress.  HENT:  Head: Normocephalic.  Right Ear: Hearing, tympanic membrane, external ear and ear canal normal. Tympanic membrane is not erythematous, not retracted and not bulging.  Left Ear: Hearing, tympanic membrane, external ear and ear canal normal. Tympanic membrane is not  erythematous, not retracted and not bulging.  Nose: No mucosal edema or rhinorrhea. Right sinus exhibits no maxillary sinus tenderness and no frontal sinus tenderness. Left sinus exhibits no maxillary sinus tenderness and no frontal sinus tenderness.  Mouth/Throat: Uvula is midline, oropharynx is clear and moist and mucous membranes are normal.  Eyes: Conjunctivae, EOM and lids are normal. Pupils are equal, round, and reactive to light. Lids are everted and swept, no foreign bodies found.  Neck: Trachea normal and normal range of motion. Neck supple. Carotid bruit is not present. No thyroid mass and no thyromegaly present.  Cardiovascular: Normal rate, regular rhythm, S1 normal, S2 normal, normal heart sounds, intact distal pulses and normal pulses.  Exam reveals no gallop and no friction rub.   No murmur heard. Pulmonary/Chest: Effort normal and breath sounds normal. No tachypnea. No respiratory distress. She has no decreased breath sounds. She has no wheezes. She has no rhonchi. She has no rales.  Abdominal: Soft. Normal appearance and bowel sounds are normal. There is no tenderness.  Musculoskeletal:       Right hip: She exhibits bony tenderness. She exhibits normal range of motion and normal strength.       Left hip: She exhibits bony tenderness. She exhibits normal range of motion.       Right knee: She exhibits decreased range of motion and swelling. Tenderness found. Medial joint line and lateral joint line tenderness noted.       Left knee: She exhibits decreased range of motion and swelling. Tenderness found. Medial joint line and lateral  joint line tenderness noted.       Lumbar back: She exhibits decreased range of motion and tenderness.  Positive bilateral SLR, neg faber's bilaterally  Neurological: She is alert. No cranial nerve deficit or sensory deficit.  Skin: Skin is warm, dry and intact. No rash noted.  Psychiatric: Her speech is normal and behavior is normal. Judgment and thought  content normal. Her mood appears not anxious. Cognition and memory are normal. She does not exhibit a depressed mood.          Assessment & Plan:

## 2016-11-28 DIAGNOSIS — M5441 Lumbago with sciatica, right side: Secondary | ICD-10-CM | POA: Insufficient documentation

## 2016-11-28 DIAGNOSIS — M2550 Pain in unspecified joint: Secondary | ICD-10-CM | POA: Insufficient documentation

## 2016-11-28 DIAGNOSIS — M5442 Lumbago with sciatica, left side: Secondary | ICD-10-CM | POA: Insufficient documentation

## 2016-11-28 HISTORY — DX: Lumbago with sciatica, right side: M54.41

## 2016-11-28 HISTORY — DX: Pain in unspecified joint: M25.50

## 2016-11-28 NOTE — Assessment & Plan Note (Signed)
Not clearly active at the moment. ? If gabapentin in past helped or not in current flare.

## 2016-11-28 NOTE — Assessment & Plan Note (Signed)
May be contributing, but today joints are areas most painful, not trigger points.  Eval with labs for autoimmune disease and tick borne disease.

## 2016-11-28 NOTE — Assessment & Plan Note (Signed)
?   New spinal stenosis symptoms? Start with prednisone taper. Hold other NSAIDs.  If not improving consider x-ray lumbars spine Start home PT, may need PT referral.

## 2016-11-28 NOTE — Assessment & Plan Note (Signed)
No clear respiratory issue or infection.  Most liekly allergies vs GI source.  Change to pantoprazole 40 mg daily , stop omeprazole.  Avoid caffeine, tomatos, soda, chocloate, alcohol. Start allegra or Xyzal  at bedtime to treat allergy as possibility of cause of cough.

## 2016-12-22 ENCOUNTER — Ambulatory Visit (INDEPENDENT_AMBULATORY_CARE_PROVIDER_SITE_OTHER): Payer: 59 | Admitting: Family Medicine

## 2016-12-22 ENCOUNTER — Encounter: Payer: Self-pay | Admitting: Family Medicine

## 2016-12-22 DIAGNOSIS — M5441 Lumbago with sciatica, right side: Secondary | ICD-10-CM | POA: Diagnosis not present

## 2016-12-22 DIAGNOSIS — M5442 Lumbago with sciatica, left side: Secondary | ICD-10-CM

## 2016-12-22 MED ORDER — DICLOFENAC SODIUM 1 % TD GEL: 2.0000 g | Freq: Four times a day (QID) | TRANSDERMAL | 3 refills | Status: DC

## 2016-12-22 NOTE — Assessment & Plan Note (Addendum)
Flares off and on .Marland Kitchen. No current pain. No further eval indicated at this time.  She will look into water exercise.

## 2016-12-22 NOTE — Progress Notes (Addendum)
Subjective:    Patient ID: Mia Kline, female    DOB: October 15, 1955, 61 y.o.   MRN: [id number]  HPI   61 year old female pt presents for follow up 2 weeks multiple issues. At last OV she had new symptoms of possible spinal stenosis and was started on a prednisone taper.  Started home PT.   Today she reports she noted some improvement on prednisone.  Once she completed pred taper.. Pain returned.   She saw chiropractor.Marland Kitchen. Helped  with right low back.. But then pain flares up off and on since. No current pain in buttock and low back.   She feels that back pain is likely related to her chronic severe knee pain. Dr. August Saucerean.. Last did Euflexxa in 2016.  No new numbness, no weakness  No needing diclofenac now.   Chronic cough has improved.  Review of Systems  Constitutional: Negative for fatigue and fever.  HENT: Negative for ear pain.   Eyes: Negative for pain.  Respiratory: Negative for chest tightness and shortness of breath.   Cardiovascular: Negative for chest pain, palpitations and leg swelling.  Gastrointestinal: Negative for abdominal pain.  Genitourinary: Negative for dysuria.       Objective:   Physical Exam  Constitutional: Vital signs are normal. She appears well-developed and well-nourished. She is cooperative.  Non-toxic appearance. She does not appear ill. No distress.  HENT:  Head: Normocephalic.  Right Ear: Hearing, tympanic membrane, external ear and ear canal normal. Tympanic membrane is not erythematous, not retracted and not bulging.  Left Ear: Hearing, tympanic membrane, external ear and ear canal normal. Tympanic membrane is not erythematous, not retracted and not bulging.  Nose: No mucosal edema or rhinorrhea. Right sinus exhibits no maxillary sinus tenderness and no frontal sinus tenderness. Left sinus exhibits no maxillary sinus tenderness and no frontal sinus tenderness.  Mouth/Throat: Uvula is midline, oropharynx is clear and moist and mucous membranes  are normal.  Eyes: Conjunctivae, EOM and lids are normal. Pupils are equal, round, and reactive to light. Lids are everted and swept, no foreign bodies found.  Neck: Trachea normal and normal range of motion. Neck supple. Carotid bruit is not present. No thyroid mass and no thyromegaly present.  Cardiovascular: Normal rate, regular rhythm, S1 normal, S2 normal, normal heart sounds, intact distal pulses and normal pulses.  Exam reveals no gallop and no friction rub.   No murmur heard. Pulmonary/Chest: Effort normal and breath sounds normal. No tachypnea. No respiratory distress. She has no decreased breath sounds. She has no wheezes. She has no rhonchi. She has no rales.  Abdominal: Soft. Normal appearance and bowel sounds are normal. There is no tenderness.  Musculoskeletal:       Right hip: She exhibits normal range of motion, normal strength and no bony tenderness.       Left hip: She exhibits normal range of motion and no bony tenderness.       Right knee: She exhibits decreased range of motion and swelling. Tenderness found. Medial joint line and lateral joint line tenderness noted.       Left knee: She exhibits decreased range of motion and swelling. Tenderness found. Medial joint line and lateral joint line tenderness noted.       Lumbar back: She exhibits normal range of motion and no tenderness.  neg bilateral SLR, neg faber's bilaterally  Neurological: She is alert. No cranial nerve deficit or sensory deficit.  Skin: Skin is warm, dry and intact. No rash noted.  Psychiatric: Her speech is normal and behavior is normal. Judgment and thought content normal. Her mood appears not anxious. Cognition and memory are normal. She does not exhibit a depressed mood.          Assessment & Plan:

## 2016-12-22 NOTE — Patient Instructions (Addendum)
Call to reschedule appt with Dr. August Saucerean for repeat eval of knee.  Look into water exercise.   Can use diclofenac gel on knees.

## 2017-01-24 ENCOUNTER — Ambulatory Visit (INDEPENDENT_AMBULATORY_CARE_PROVIDER_SITE_OTHER): Payer: 59

## 2017-01-24 ENCOUNTER — Encounter (INDEPENDENT_AMBULATORY_CARE_PROVIDER_SITE_OTHER): Payer: Self-pay | Admitting: Orthopedic Surgery

## 2017-01-24 ENCOUNTER — Ambulatory Visit (INDEPENDENT_AMBULATORY_CARE_PROVIDER_SITE_OTHER): Payer: 59 | Admitting: Orthopedic Surgery

## 2017-01-24 DIAGNOSIS — M25561 Pain in right knee: Secondary | ICD-10-CM

## 2017-01-24 DIAGNOSIS — M25562 Pain in left knee: Secondary | ICD-10-CM | POA: Diagnosis not present

## 2017-01-24 DIAGNOSIS — G8929 Other chronic pain: Secondary | ICD-10-CM | POA: Diagnosis not present

## 2017-01-25 DIAGNOSIS — M25562 Pain in left knee: Secondary | ICD-10-CM | POA: Diagnosis not present

## 2017-01-25 DIAGNOSIS — G8929 Other chronic pain: Secondary | ICD-10-CM

## 2017-01-25 DIAGNOSIS — M25561 Pain in right knee: Secondary | ICD-10-CM

## 2017-01-25 MED ORDER — BUPIVACAINE HCL 0.25 % IJ SOLN
4.0000 mL | INTRAMUSCULAR | Status: AC | PRN
  Administered 2017-01-25: 4 mL via INTRA_ARTICULAR

## 2017-01-25 MED ORDER — METHYLPREDNISOLONE ACETATE 40 MG/ML IJ SUSP
40.0000 mg | INTRAMUSCULAR | Status: AC | PRN
  Administered 2017-01-25: 40 mg via INTRA_ARTICULAR

## 2017-01-25 MED ORDER — LIDOCAINE HCL 1 % IJ SOLN
5.0000 mL | INTRAMUSCULAR | Status: AC | PRN
  Administered 2017-01-25: 5 mL

## 2017-01-25 NOTE — Progress Notes (Signed)
Office Visit Note   Patient: Mia Kline           Date of Birth: [birth year]           MRN: [id number] Visit Date: 01/24/2017 Requested by: Excell Seltzer, MD [address] [place], [place] [address] PCP: Excell Seltzer, MD  Subjective: Chief Complaint  Patient presents with  . Right Knee - Pain  . Left Knee - Pain    HPI: Mia Kline is a 61 year old patient with bilateral knee pain.  Have seen her before years ago.  She has known bilateral patellofemoral arthritis.  1 knees not initially worsen the other.  Both have been much worse recently.  She went to her primary care provider tried a steroid Dosepak which helped her knees and hips.  She had Lyme disease workup which was negative.  At sometimes all of her joints hurt.  She does report bilateral knee swelling as well as crepitus with bending.  She does have a history of using cortisone injections in the flexor injections.  She lives alone.  She works doing Estate manager/land agent and accounting work.  She uses Aleve for pain and diclofenac gel on her knees as well.              ROS: All systems reviewed are negative as they relate to the chief complaint within the history of present illness.  Patient denies  fevers or chills.   Assessment & Plan: Visit Diagnoses:  1. Chronic pain of both knees     Plan: Impression is bilateral knee pain with severe patellofemoral arthritis.  Mild lateral patellar subluxation is present.  She's had no patellar instability but does report severe arthritis.  Bilateral knee aspiration injections are performed today.  She may need to consider knee replacement in the future.  Patellofemoral replacement may also be an option for her depending on her alignments and extensor mechanism axis.  I'll see her back as needed  Follow-Up Instructions: Return if symptoms worsen or fail to improve.   Orders:  Orders Placed This Encounter  Procedures  . XR KNEE 3 VIEW RIGHT  . XR KNEE 3 VIEW LEFT   No  orders of the defined types were placed in this encounter.     Procedures: Large Joint Inj Date/Time: 01/25/2017 5:08 PM Performed by: Cammy Copa Authorized by: Cammy Copa   Consent Given by:  Patient Site marked: the procedure site was marked   Timeout: prior to procedure the correct patient, procedure, and site was verified   Indications:  Pain, joint swelling and diagnostic evaluation Location:  Knee Site:  R knee Prep: patient was prepped and draped in usual sterile fashion   Needle Size:  18 G Needle Length:  1.5 inches Approach:  Superolateral Ultrasound Guidance: No   Fluoroscopic Guidance: No   Arthrogram: No   Medications:  5 mL lidocaine 1 %; 4 mL bupivacaine 0.25 %; 40 mg methylPREDNISolone acetate 40 MG/ML Aspiration Attempted: Yes   Aspirate amount (mL):  35 Aspirate:  Yellow Patient tolerance:  Patient tolerated the procedure well with no immediate complications Large Joint Inj Date/Time: 01/25/2017 5:09 PM Performed by: Cammy Copa Authorized by: Cammy Copa   Consent Given by:  Patient Site marked: the procedure site was marked   Timeout: prior to procedure the correct patient, procedure, and site was verified   Indications:  Pain, joint swelling and diagnostic evaluation Location:  Knee Site:  L knee Prep: patient was  prepped and draped in usual sterile fashion   Needle Size:  18 G Needle Length:  1.5 inches Approach:  Superolateral Ultrasound Guidance: No   Fluoroscopic Guidance: No   Arthrogram: No   Medications:  5 mL lidocaine 1 %; 4 mL bupivacaine 0.25 %; 40 mg methylPREDNISolone acetate 40 MG/ML Aspiration Attempted: Yes   Aspirate amount (mL):  35 Aspirate:  Yellow Patient tolerance:  Patient tolerated the procedure well with no immediate complications     Clinical Data: No additional findings.  Objective: Vital Signs: There were no vitals taken for this visit.  Physical Exam:   Constitutional:  Patient appears well-developed HEENT:  Head: Normocephalic Eyes:EOM are normal Neck: Normal range of motion Cardiovascular: Normal rate Pulmonary/chest: Effort normal Neurologic: Patient is alert Skin: Skin is warm Psychiatric: Patient has normal mood and affect    Ortho Exam: Orthopedic exam demonstrates reasonable knee range of motion but bilateral effusions are present.  Significant patellofemoral crepitus is present with ambulation.  Collateral cruciate ligaments are stable.  Extensor mechanism is intact.  No groin pain with internal internal rotation leg.  No other masses lymph adenopathy or skin changes noted in the knee region.  Specialty Comments:  No specialty comments available.  Imaging: No results found.   PMFS History: Patient Active Problem List   Diagnosis Date Noted  . Multiple joint pain 11/28/2016  . Low back pain with bilateral sciatica 11/28/2016  . Panic disorder without agoraphobia with moderate panic attacks 07/01/2015  . LLQ pain 07/01/2015  . Tinnitus 07/01/2015  . Lightheadedness 04/20/2015  . Myalgia and myositis 04/20/2015  . Chronic insomnia 10/03/2013  . Plantar fasciitis, bilateral 03/28/2013  . Fibromyalgia 06/26/2011  . MICROSCOPIC HEMATURIA 04/15/2010  . PARESTHESIA 09/28/2009  . PALPITATIONS, CHRONIC 09/28/2009  . Headache(784.0) 09/07/2009  . INTERNAL HEMORRHOIDS WITHOUT MENTION COMP 02/10/2009  . IBS 02/10/2009  . FATIGUE, CHRONIC 01/12/2009  . Chronic cough 10/02/2008  . GENITAL HERPES 02/12/2008  . Hyperlipemia 02/12/2008  . Anemia, iron deficiency 02/12/2008  . Major depressive disorder, recurrent episode, moderate (HCC) 02/12/2008  . COMMON MIGRAINE 02/12/2008  . Essential hypertension, benign 02/12/2008  . GERD 02/12/2008  . OSTEOARTHRITIS 02/12/2008  . ABDOMINAL PAIN, CHRONIC 02/12/2008  . HEART MURMUR, HX OF 02/12/2008   Past Medical History:  Diagnosis Date  . Abdominal pain, generalized   . Abdominal pain,  unspecified site   . Acute cystitis   . Acute sinusitis, unspecified   . Anemia    Iron deficiency  . Arm pain, left   . Chest pain, unspecified   . Common migraine   . Cough   . Depression   . Dizziness and giddiness   . Elbow pain   . Elbow, forearm, and wrist, abrasion or friction burn, without mention of infection   . Genital herpes, unspecified   . GERD (gastroesophageal reflux disease)   . Headache(784.0)   . Heart murmur   . Hyperlipidemia   . Hypertension   . IBS (irritable bowel syndrome)   . Internal hemorrhoids without mention of complication   . Knee pain, left   . Microscopic hematuria   . Osteoarthrosis, unspecified whether generalized or localized, unspecified site   . Other malaise and fatigue   . Other screening mammogram   . Palpitations   . Paresthesia   . Routine general medical examination at a health care facility   . Routine gynecological examination   . Small bowel obstruction (HCC)   . Urinary tract infection, site not specified  Family History  Problem Relation Age of Onset  . Cancer Mother        Breast  . Cancer Father        Lung  . Heart disease Father        CABG  . Colon polyps Sister   . Cancer Maternal Aunt        Cirrhosis of liver    Past Surgical History:  Procedure Laterality Date  . Barium Follow Through  2007   because rectum twisted and couldn't do colonoscopy, was painful though  . barium swallow  2006   Hiatal hernia  . BILATERAL OOPHORECTOMY  2006   For large cyst  . CARDIOVASCULAR STRESS TEST  09/2009   Low risk  . PARTIAL HYSTERECTOMY  1998   Vaginal   Social History   Occupational History  . Medical sales representativeLogistics Analyst, [employer], [employer]    Social History Main Topics  . Smoking status: Never Smoker  . Smokeless tobacco: Never Used  . Alcohol use 0.0 oz/week  . Drug use: No     Comment: Remote but no injected.  Marland Kitchen. Sexual activity: Not on file

## 2017-02-26 ENCOUNTER — Other Ambulatory Visit: Payer: Self-pay | Admitting: Family Medicine

## 2017-02-27 NOTE — Telephone Encounter (Signed)
Last office visit 12/22/2016.  Last refilled 02/04/16 for #90 with 3 refills.  Ok to refill?

## 2017-04-08 ENCOUNTER — Other Ambulatory Visit: Payer: Self-pay | Admitting: Family Medicine

## 2017-04-08 NOTE — Telephone Encounter (Signed)
Last office visit 12/22/16. Last refilled 02/27/17 for #90 with no refills.  Ok to refill?

## 2017-04-16 ENCOUNTER — Other Ambulatory Visit: Payer: Self-pay | Admitting: Family Medicine

## 2017-04-20 ENCOUNTER — Other Ambulatory Visit (INDEPENDENT_AMBULATORY_CARE_PROVIDER_SITE_OTHER): Payer: 59

## 2017-04-20 ENCOUNTER — Telehealth: Payer: Self-pay | Admitting: Family Medicine

## 2017-04-20 DIAGNOSIS — E782 Mixed hyperlipidemia: Secondary | ICD-10-CM

## 2017-04-20 DIAGNOSIS — D508 Other iron deficiency anemias: Secondary | ICD-10-CM | POA: Diagnosis not present

## 2017-04-20 LAB — CBC WITH DIFFERENTIAL/PLATELET
BASOS PCT: 0.7 % (ref 0.0–3.0)
Basophils Absolute: 0 10*3/uL (ref 0.0–0.1)
EOS PCT: 2.4 % (ref 0.0–5.0)
Eosinophils Absolute: 0.2 10*3/uL (ref 0.0–0.7)
HCT: 38.9 % (ref 36.0–46.0)
Hemoglobin: 12.5 g/dL (ref 12.0–15.0)
LYMPHS ABS: 2.5 10*3/uL (ref 0.7–4.0)
Lymphocytes Relative: 38 % (ref 12.0–46.0)
MCHC: 32.1 g/dL (ref 30.0–36.0)
MCV: 78.9 fl (ref 78.0–100.0)
MONO ABS: 0.6 10*3/uL (ref 0.1–1.0)
MONOS PCT: 8.7 % (ref 3.0–12.0)
NEUTROS ABS: 3.3 10*3/uL (ref 1.4–7.7)
NEUTROS PCT: 50.2 % (ref 43.0–77.0)
Platelets: 252 10*3/uL (ref 150.0–400.0)
RBC: 4.94 Mil/uL (ref 3.87–5.11)
RDW: 18.2 % — ABNORMAL HIGH (ref 11.5–15.5)
WBC: 6.6 10*3/uL (ref 4.0–10.5)

## 2017-04-20 LAB — LIPID PANEL
CHOLESTEROL: 216 mg/dL — AB (ref 0–200)
HDL: 55.5 mg/dL (ref >39.00)
NONHDL: 160.94
TRIGLYCERIDES: 250 mg/dL — AB (ref 0.0–149.0)
Total CHOL/HDL Ratio: 4
VLDL: 50 mg/dL — ABNORMAL HIGH (ref 0.0–40.0)

## 2017-04-20 LAB — COMPREHENSIVE METABOLIC PANEL
ALBUMIN: 4.2 g/dL (ref 3.5–5.2)
ALK PHOS: 57 U/L (ref 39–117)
ALT: 36 U/L — AB (ref 0–35)
AST: 27 U/L (ref 0–37)
BILIRUBIN TOTAL: 0.3 mg/dL (ref 0.2–1.2)
BUN: 15 mg/dL (ref 6–23)
CALCIUM: 9.4 mg/dL (ref 8.4–10.5)
CO2: 32 mEq/L (ref 19–32)
CREATININE: 0.92 mg/dL (ref 0.40–1.20)
Chloride: 98 mEq/L (ref 96–112)
GFR: 65.88 mL/min (ref >60.00)
Glucose, Bld: 96 mg/dL (ref 70–99)
Potassium: 3.7 mEq/L (ref 3.5–5.1)
Sodium: 139 mEq/L (ref 135–145)
TOTAL PROTEIN: 6.2 g/dL (ref 6.0–8.3)

## 2017-04-20 LAB — LDL CHOLESTEROL, DIRECT: Direct LDL: 119 mg/dL

## 2017-04-20 NOTE — Telephone Encounter (Signed)
-----   Message from Alvina Chou sent at 04/11/2017  4:17 PM EDT ----- Regarding: Lab orders for Friday, 9.21.18 Patient is scheduled for CPX labs, please order future labs, Thanks , Camelia Eng

## 2017-04-27 ENCOUNTER — Encounter: Payer: Self-pay | Admitting: Family Medicine

## 2017-04-27 ENCOUNTER — Ambulatory Visit (INDEPENDENT_AMBULATORY_CARE_PROVIDER_SITE_OTHER): Payer: 59 | Admitting: Family Medicine

## 2017-04-27 VITALS — BP 120/80 | HR 88 | Temp 98.5°F | Ht 67.0 in | Wt 230.8 lb

## 2017-04-27 DIAGNOSIS — F331 Major depressive disorder, recurrent, moderate: Secondary | ICD-10-CM | POA: Diagnosis not present

## 2017-04-27 DIAGNOSIS — I10 Essential (primary) hypertension: Secondary | ICD-10-CM

## 2017-04-27 DIAGNOSIS — Z0000 Encounter for general adult medical examination without abnormal findings: Secondary | ICD-10-CM

## 2017-04-27 DIAGNOSIS — D508 Other iron deficiency anemias: Secondary | ICD-10-CM

## 2017-04-27 MED ORDER — SIMVASTATIN 40 MG PO TABS: 40.0000 mg | ORAL_TABLET | Freq: Every day | ORAL | 3 refills | Status: DC

## 2017-04-27 NOTE — Assessment & Plan Note (Signed)
Well controlled. Continue current medication.  

## 2017-04-27 NOTE — Progress Notes (Signed)
Subjective:    Patient ID: Mia Kline, female    DOB: December 30, 1955, 61 y.o.   MRN: [id number]  HPI  The patient is here for annual wellness.   01/24/2017 Dr. August Saucer injected bilateral knees with steroid.. Improved pain for  1 month but had SE chest pressure  day after injections.  Has follow up soon.. Given pain returned.. May need knee replacement.  Cannot afford diclofenac gel.. Wants to go back to using diclofenac tabs, but does not want risk.  Hypertension:   well controlled on HCTZ.  Using medication without problems or lightheadedness:  none Chest pain with exertion:none Edema:none Short of breath: none Average home BPs: Other issues:  Elevated Cholesterol: LDL not at goal on simvastatin 20 mg daily. Using medications without problems: none Muscle aches: none Diet compliance: moderate. Exercise:none Other complaints:   Depression, major recurrent: improved on venlafaxine PHQ2  Social History /Family History/Past Medical History reviewed in detail and updated in EMR if needed. Blood pressure 120/80, pulse 88, temperature 98.5 F (36.9 C), temperature source Oral, height  (1.702 m), weight 230 lb 12 oz (104.7 kg).   Review of Systems  Constitutional: Negative for fatigue and fever.  HENT: Negative for congestion and ear pain.   Eyes: Negative for pain.  Respiratory: Negative for cough, chest tightness and shortness of breath.   Cardiovascular: Negative for chest pain, palpitations and leg swelling.  Gastrointestinal: Negative for abdominal pain.  Genitourinary: Negative for dysuria and vaginal bleeding.  Musculoskeletal: Negative for back pain.  Neurological: Negative for syncope, light-headedness and headaches.  Psychiatric/Behavioral: Negative for dysphoric mood.       Objective:   Physical Exam  Constitutional: Vital signs are normal. She appears well-developed and well-nourished. She is cooperative.  Non-toxic appearance. She does not appear ill. No  distress.  HENT:  Head: Normocephalic.  Right Ear: Hearing, tympanic membrane, external ear and ear canal normal. Tympanic membrane is not erythematous, not retracted and not bulging.  Left Ear: Hearing, tympanic membrane, external ear and ear canal normal. Tympanic membrane is not erythematous, not retracted and not bulging.  Nose: Nose normal. No mucosal edema or rhinorrhea. Right sinus exhibits no maxillary sinus tenderness and no frontal sinus tenderness. Left sinus exhibits no maxillary sinus tenderness and no frontal sinus tenderness.  Mouth/Throat: Uvula is midline, oropharynx is clear and moist and mucous membranes are normal.  Eyes: Pupils are equal, round, and reactive to light. Conjunctivae, EOM and lids are normal. Lids are everted and swept, no foreign bodies found.  Neck: Trachea normal and normal range of motion. Neck supple. Carotid bruit is not present. No thyroid mass and no thyromegaly present.  Cardiovascular: Normal rate, regular rhythm, S1 normal, S2 normal, normal heart sounds, intact distal pulses and normal pulses.  Exam reveals no gallop and no friction rub.   No murmur heard. Pulmonary/Chest: Effort normal and breath sounds normal. No tachypnea. No respiratory distress. She has no decreased breath sounds. She has no wheezes. She has no rhonchi. She has no rales.  Abdominal: Soft. Normal appearance and bowel sounds are normal. She exhibits no distension, no fluid wave, no abdominal bruit and no mass. There is no hepatosplenomegaly. There is no tenderness. There is no rebound, no guarding and no CVA tenderness. No hernia.  Musculoskeletal:       Right hip: She exhibits bony tenderness. She exhibits normal range of motion and normal strength.       Left hip: She exhibits bony tenderness. She exhibits normal  range of motion.       Right knee: She exhibits decreased range of motion and swelling. Tenderness found. Medial joint line and lateral joint line tenderness noted.        Left knee: She exhibits decreased range of motion and swelling. Tenderness found. Medial joint line and lateral joint line tenderness noted.       Lumbar back: She exhibits decreased range of motion and tenderness.  Positive bilateral SLR, neg faber's bilaterally  Lymphadenopathy:    She has no cervical adenopathy.    She has no axillary adenopathy.  Neurological: She is alert. She has normal strength. No cranial nerve deficit or sensory deficit.  Skin: Skin is warm, dry and intact. No rash noted.  Psychiatric: Her speech is normal and behavior is normal. Judgment and thought content normal. Her mood appears not anxious. Cognition and memory are normal. She does not exhibit a depressed mood.          Assessment & Plan:  The patient's preventative maintenance and recommended screening tests for an annual wellness exam were reviewed in full today. Brought up to date unless services declined.  Counselled on the importance of diet, exercise, and its role in overall health and mortality. The patient's FH and SH was reviewed, including their home life, tobacco status, and drug and alcohol status.   Partial hysterectomy,then BSO no pap neeeded, no DVE.  Nml Mammogram 02/2012, due,mother and father with breast cancer history.  SHE REFUSES AT THIS TIME. She has not felt any abnormalities. Up to Date with vaccines, gets flu vaccine at work, refused shingles. Colon cancer screening:02/25/2009 nml, rpt in 10 years.      HCV  Neg. Nonsmoker

## 2017-04-27 NOTE — Addendum Note (Signed)
Addended by: Damita Lack on: 04/27/2017 03:15 PM   Modules accepted: Orders

## 2017-04-27 NOTE — Assessment & Plan Note (Signed)
Stable control on venlafaxine 

## 2017-04-27 NOTE — Assessment & Plan Note (Signed)
Resolved

## 2017-04-27 NOTE — Patient Instructions (Signed)
Work on low cholesterol diet as able.  Increase simvastatin to 40 mg daily.

## 2017-04-30 ENCOUNTER — Encounter (INDEPENDENT_AMBULATORY_CARE_PROVIDER_SITE_OTHER): Payer: Self-pay | Admitting: Orthopedic Surgery

## 2017-04-30 ENCOUNTER — Ambulatory Visit (INDEPENDENT_AMBULATORY_CARE_PROVIDER_SITE_OTHER): Payer: 59 | Admitting: Orthopedic Surgery

## 2017-04-30 DIAGNOSIS — M1711 Unilateral primary osteoarthritis, right knee: Secondary | ICD-10-CM | POA: Diagnosis not present

## 2017-04-30 DIAGNOSIS — M1712 Unilateral primary osteoarthritis, left knee: Secondary | ICD-10-CM

## 2017-04-30 NOTE — Progress Notes (Signed)
Office Visit Note   Patient: Mia Kline           Date of Birth: 03/22/1956           MRN: [id number] Visit Date: 04/30/2017 Requested by: Excell Seltzer, MD [address] [place], [place] [address] PCP: Excell Seltzer, MD  Subjective: Chief Complaint  Patient presents with  . Knee Pain    bilateral knees    HPI: Mia Kline is a 61 year old patient with bilateral knee arthritis primarily patellofemoral.  She had aspiration injection done in June.  Reports recurrent pain for the last month.  She is wondering about repeat injection and aspiration today.  With bilateral injection she had a bit of a reaction to the steroid.  Left knee hurts worse than the right knee.  She is considering knee replacement either partial or complete due to her ongoing and worsening symptoms              ROS: All systems reviewed are negative as they relate to the chief complaint within the history of present illness.  Patient denies  fevers or chills.   Assessment & Plan: Visit Diagnoses: No diagnosis found.  Plan: Impression is bilateral knee pain left worse than right.  Plan is to aspirate and inject the left knee today which is done with aspiration of about 20 mL of serous fluid.  Injected 9 and 1 mL Marcaine and cortisone.  Seven-day return to inject the right knee.  We will post her for left partial versus complete knee replacement next week Follow-Up Instructions: Return in about 1 week (around 05/07/2017).   Orders:  No orders of the defined types were placed in this encounter.  No orders of the defined types were placed in this encounter.     Procedures: Large Joint Inj Date/Time: 05/03/2017 8:35 PM Performed by: Cammy Copa Authorized by: Cammy Copa   Consent Given by:  Patient Site marked: the procedure site was marked   Timeout: prior to procedure the correct patient, procedure, and site was verified   Indications:  Pain, joint swelling and diagnostic  evaluation Location:  Knee Site:  L knee Prep: patient was prepped and draped in usual sterile fashion   Needle Size:  18 G Needle Length:  1.5 inches Approach:  Superolateral Ultrasound Guidance: No   Fluoroscopic Guidance: No   Arthrogram: No   Medications:  5 mL lidocaine 1 %; 4 mL bupivacaine 0.25 %; 40 mg methylPREDNISolone acetate 40 MG/ML Patient tolerance:  Patient tolerated the procedure well with no immediate complications     Clinical Data: No additional findings.  Objective: Vital Signs: There were no vitals taken for this visit.  Physical Exam:   Constitutional: Patient appears well-developed HEENT:  Head: Normocephalic Eyes:EOM are normal Neck: Normal range of motion Cardiovascular: Normal rate Pulmonary/chest: Effort normal Neurologic: Patient is alert Skin: Skin is warm Psychiatric: Patient has normal mood and affect    Ortho Exam: Orthopedic exam demonstrates autofusion left knee no effusion right knee patellofemoral crepitus is present.  Collateral and cruciate ligaments are stable.  No masses lymph adenopathy or skin changes noted in the left knee region.  Pedal pulses palpable.  No groin pain with internal/external rotation of either leg.  Periretinacular tenderness is noted  Specialty Comments:  No specialty comments available.  Imaging: No results found.   PMFS History: Patient Active Problem List   Diagnosis Date Noted  . Multiple joint pain 11/28/2016  . Low back  pain with bilateral sciatica 11/28/2016  . Panic disorder without agoraphobia with moderate panic attacks 07/01/2015  . LLQ pain 07/01/2015  . Tinnitus 07/01/2015  . Chronic insomnia 10/03/2013  . Plantar fasciitis, bilateral 03/28/2013  . Fibromyalgia 06/26/2011  . MICROSCOPIC HEMATURIA 04/15/2010  . PARESTHESIA 09/28/2009  . PALPITATIONS, CHRONIC 09/28/2009  . Headache(784.0) 09/07/2009  . INTERNAL HEMORRHOIDS WITHOUT MENTION COMP 02/10/2009  . IBS 02/10/2009  .  FATIGUE, CHRONIC 01/12/2009  . Chronic cough 10/02/2008  . GENITAL HERPES 02/12/2008  . Hyperlipemia 02/12/2008  . Anemia, iron deficiency 02/12/2008  . Major depressive disorder, recurrent episode, moderate (HCC) 02/12/2008  . COMMON MIGRAINE 02/12/2008  . Essential hypertension, benign 02/12/2008  . GERD 02/12/2008  . OSTEOARTHRITIS 02/12/2008  . ABDOMINAL PAIN, CHRONIC 02/12/2008  . HEART MURMUR, HX OF 02/12/2008   Past Medical History:  Diagnosis Date  . Abdominal pain, generalized   . Abdominal pain, unspecified site   . Acute cystitis   . Acute sinusitis, unspecified   . Anemia    Iron deficiency  . Arm pain, left   . Chest pain, unspecified   . Common migraine   . Cough   . Depression   . Dizziness and giddiness   . Elbow pain   . Elbow, forearm, and wrist, abrasion or friction burn, without mention of infection   . Genital herpes, unspecified   . GERD (gastroesophageal reflux disease)   . Headache(784.0)   . Heart murmur   . Hyperlipidemia   . Hypertension   . IBS (irritable bowel syndrome)   . Internal hemorrhoids without mention of complication   . Knee pain, left   . Microscopic hematuria   . Osteoarthrosis, unspecified whether generalized or localized, unspecified site   . Other malaise and fatigue   . Other screening mammogram   . Palpitations   . Paresthesia   . Routine general medical examination at a health care facility   . Routine gynecological examination   . Small bowel obstruction (HCC)   . Urinary tract infection, site not specified     Family History  Problem Relation Age of Onset  . Cancer Mother        Breast  . Cancer Father        Lung  . Heart disease Father        CABG  . Colon polyps Sister   . Cancer Maternal Aunt        Cirrhosis of liver    Past Surgical History:  Procedure Laterality Date  . Barium Follow Through  2007   because rectum twisted and couldn't do colonoscopy, was painful though  . barium swallow  2006    Hiatal hernia  . BILATERAL OOPHORECTOMY  2006   For large cyst  . CARDIOVASCULAR STRESS TEST  09/2009   Low risk  . PARTIAL HYSTERECTOMY  1998   Vaginal   Social History   Occupational History  . Medical sales representative, [employer], [employer]    Social History Main Topics  . Smoking status: Never Smoker  . Smokeless tobacco: Never Used  . Alcohol use 0.0 oz/week  . Drug use: No     Comment: Remote but no injected.  Marland Kitchen Sexual activity: Not on file

## 2017-05-01 DIAGNOSIS — Z23 Encounter for immunization: Secondary | ICD-10-CM | POA: Diagnosis not present

## 2017-05-03 DIAGNOSIS — M1712 Unilateral primary osteoarthritis, left knee: Secondary | ICD-10-CM | POA: Diagnosis not present

## 2017-05-03 DIAGNOSIS — M1711 Unilateral primary osteoarthritis, right knee: Secondary | ICD-10-CM | POA: Diagnosis not present

## 2017-05-03 MED ORDER — BUPIVACAINE HCL 0.25 % IJ SOLN
4.0000 mL | INTRAMUSCULAR | Status: AC | PRN
  Administered 2017-05-03: 4 mL via INTRA_ARTICULAR

## 2017-05-03 MED ORDER — METHYLPREDNISOLONE ACETATE 40 MG/ML IJ SUSP
40.0000 mg | INTRAMUSCULAR | Status: AC | PRN
  Administered 2017-05-03: 40 mg via INTRA_ARTICULAR

## 2017-05-03 MED ORDER — LIDOCAINE HCL 1 % IJ SOLN
5.0000 mL | INTRAMUSCULAR | Status: AC | PRN
  Administered 2017-05-03: 5 mL

## 2017-05-07 ENCOUNTER — Encounter (INDEPENDENT_AMBULATORY_CARE_PROVIDER_SITE_OTHER): Payer: Self-pay | Admitting: Orthopedic Surgery

## 2017-05-07 ENCOUNTER — Ambulatory Visit (INDEPENDENT_AMBULATORY_CARE_PROVIDER_SITE_OTHER): Payer: 59 | Admitting: Orthopedic Surgery

## 2017-05-07 DIAGNOSIS — M1711 Unilateral primary osteoarthritis, right knee: Secondary | ICD-10-CM

## 2017-05-08 DIAGNOSIS — M25561 Pain in right knee: Secondary | ICD-10-CM | POA: Diagnosis not present

## 2017-05-08 DIAGNOSIS — M1711 Unilateral primary osteoarthritis, right knee: Secondary | ICD-10-CM | POA: Diagnosis not present

## 2017-05-08 MED ORDER — LIDOCAINE HCL 1 % IJ SOLN
5.0000 mL | INTRAMUSCULAR | Status: AC | PRN
  Administered 2017-05-08: 5 mL

## 2017-05-08 MED ORDER — BUPIVACAINE HCL 0.25 % IJ SOLN
4.0000 mL | INTRAMUSCULAR | Status: AC | PRN
  Administered 2017-05-08: 4 mL via INTRA_ARTICULAR

## 2017-05-08 MED ORDER — METHYLPREDNISOLONE ACETATE 40 MG/ML IJ SUSP
40.0000 mg | INTRAMUSCULAR | Status: AC | PRN
  Administered 2017-05-08: 40 mg via INTRA_ARTICULAR

## 2017-05-08 NOTE — Progress Notes (Signed)
   Procedure Note  Patient: Mia Kline             Date of Birth: [birth year]           MRN: [id number]             Visit Date: 05/07/2017  Procedures: Visit Diagnoses: No diagnosis found.  Large Joint Inj Date/Time: 05/08/2017 11:27 PM Performed by: Cammy Copa Authorized by: Cammy Copa   Consent Given by:  Patient Site marked: the procedure site was marked   Timeout: prior to procedure the correct patient, procedure, and site was verified   Indications:  Pain, joint swelling and diagnostic evaluation Location:  Knee Site:  R knee Prep: patient was prepped and draped in usual sterile fashion   Needle Size:  18 G Needle Length:  1.5 inches Approach:  Superolateral Ultrasound Guidance: No   Fluoroscopic Guidance: No   Arthrogram: No   Medications:  5 mL lidocaine 1 %; 4 mL bupivacaine 0.25 %; 40 mg methylPREDNISolone acetate 40 MG/ML Aspiration Attempted: Yes   Aspirate amount (mL):  20 Aspirate:  Yellow Patient tolerance:  Patient tolerated the procedure well with no immediate complications   We also discussed left knee replacement.  Reviewed the patient's plain radiographs and she does have patellofemoral arthritis along with spurring in the lateral compartment both laterally as well as posteriorly.  An extra best bet would be total knee replacement on the left-hand side.  Risks and benefits are discussed including but not limited to infection or vessel damage incomplete pain relief as well as potential need for revision in her lifetime.  All questions answered.  She may be a candidate for press-fit prosthesis.

## 2017-05-11 ENCOUNTER — Telehealth (INDEPENDENT_AMBULATORY_CARE_PROVIDER_SITE_OTHER): Payer: Self-pay

## 2017-05-11 NOTE — Telephone Encounter (Signed)
Pt would like to be scheduled asap for her knee replacement with Dr. August Saucer

## 2017-05-18 NOTE — Telephone Encounter (Signed)
I called patient and scheduled surgery. 

## 2017-05-23 ENCOUNTER — Telehealth (INDEPENDENT_AMBULATORY_CARE_PROVIDER_SITE_OTHER): Payer: Self-pay | Admitting: Orthopedic Surgery

## 2017-05-23 NOTE — Telephone Encounter (Signed)
Tried calling patient to discuss. Unsure what she is in need of based off of message taken. Asked patient to return call.

## 2017-05-23 NOTE — Telephone Encounter (Signed)
Mia Kline, Salvatore 01/31/1956 [phone]   Based upon the conversation I had with the pt I think she needs a prior authorization. I scheduled her  05/30/17 @3 :45pm I told the pt I would put in a telephone call I wasn't to sure on how to answer her question.

## 2017-05-24 NOTE — Telephone Encounter (Signed)
IC s/w patient. No prior Berkley Harveyauth will be needed. She is just coming in for knee aspirations.

## 2017-05-27 ENCOUNTER — Other Ambulatory Visit: Payer: Self-pay | Admitting: Family Medicine

## 2017-05-30 ENCOUNTER — Encounter (INDEPENDENT_AMBULATORY_CARE_PROVIDER_SITE_OTHER): Payer: Self-pay | Admitting: Orthopedic Surgery

## 2017-05-30 ENCOUNTER — Ambulatory Visit (INDEPENDENT_AMBULATORY_CARE_PROVIDER_SITE_OTHER): Payer: 59 | Admitting: Orthopedic Surgery

## 2017-05-30 DIAGNOSIS — M1712 Unilateral primary osteoarthritis, left knee: Secondary | ICD-10-CM

## 2017-05-30 DIAGNOSIS — M1711 Unilateral primary osteoarthritis, right knee: Secondary | ICD-10-CM

## 2017-05-30 MED ORDER — LIDOCAINE HCL 1 % IJ SOLN
5.0000 mL | INTRAMUSCULAR | Status: AC | PRN
  Administered 2017-05-30: 5 mL

## 2017-05-30 NOTE — Progress Notes (Signed)
Office Visit Note   Patient: Mia Kline           Date of Birth: Oct 21, 1955           MRN: [id number] Visit Date: 05/30/2017 Requested by: Excell Seltzer, MD [address] [place], [place] [address] PCP: Excell Seltzer, MD  Subjective: Chief Complaint  Patient presents with  . Knee Pain    request bilateral knee aspiration    HPI: Mia Kline is a 61 year old patient with bilateral knee pain and arthritis.  She is scheduled for knee replacement 07/02/2017.  She states that she's having hip and back pain.  Aleve helps sometimes.  She believes she has swelling in both knees              ROS: All systems reviewed are negative as they relate to the chief complaint within the history of present illness.  Patient denies  fevers or chills.   Assessment & Plan: Visit Diagnoses:  1. Unilateral primary osteoarthritis, left knee   2. Unilateral primary osteoarthritis, right knee     Plan: Impression is bilateral knee pain with severe patellofemoral and likely lateral compartment arthritis.  Plan is bilateral knee aspiration which is performed today.  Discussed with her as well about total knee replacement versus patellofemoral arthroplasty.  In general I think that can be again time decision but from the plain radiographs it looks as if total knee replacement may be her best option based on spurring in the lateral compartment.  Risk and benefits discussed including but not limited to infection or vessel damage potential need for more surgery as well as incomplete pain relief no family history of DVT or pulmonary embolism.  All questions answered  Follow-Up Instructions: No Follow-up on file.   Orders:  No orders of the defined types were placed in this encounter.  No orders of the defined types were placed in this encounter.     Procedures: Large Joint Inj Date/Time: 05/30/2017 10:38 PM Performed by: Cammy Copa Authorized by: Cammy Copa   Consent Given by:   Patient Site marked: the procedure site was marked   Timeout: prior to procedure the correct patient, procedure, and site was verified   Indications:  Pain, joint swelling and diagnostic evaluation Location:  Knee Site:  R knee Prep: patient was prepped and draped in usual sterile fashion   Needle Size:  18 G Needle Length:  1.5 inches Approach:  Superolateral Ultrasound Guidance: No   Fluoroscopic Guidance: No   Arthrogram: No   Medications:  5 mL lidocaine 1 % Aspiration Attempted: Yes   Aspirate amount (mL):  20 Aspirate:  Yellow Patient tolerance:  Patient tolerated the procedure well with no immediate complications Large Joint Inj Date/Time: 05/30/2017 10:38 PM Performed by: Cammy Copa Authorized by: Cammy Copa   Consent Given by:  Patient Site marked: the procedure site was marked   Timeout: prior to procedure the correct patient, procedure, and site was verified   Indications:  Pain, joint swelling and diagnostic evaluation Location:  Knee Site:  L knee Prep: patient was prepped and draped in usual sterile fashion   Needle Size:  18 G Needle Length:  1.5 inches Approach:  Superolateral Ultrasound Guidance: No   Fluoroscopic Guidance: No   Arthrogram: No   Medications:  5 mL lidocaine 1 % Aspiration Attempted: Yes   Aspirate amount (mL):  40 Aspirate:  Yellow Patient tolerance:  Patient tolerated the procedure well with no  immediate complications     Clinical Data: No additional findings.  Objective: Vital Signs: There were no vitals taken for this visit.  Physical Exam:   Constitutional: Patient appears well-developed HEENT:  Head: Normocephalic Eyes:EOM are normal Neck: Normal range of motion Cardiovascular: Normal rate Pulmonary/chest: Effort normal Neurologic: Patient is alert Skin: Skin is warm Psychiatric: Patient has normal mood and affect    Ortho Exam: Orthopedic exam demonstrates bilateral knee effusions with no groin  pain with internal/external rotation of the leg palpable pedal pulses no masses lymph adenopathy or skin changes noted in the knee regions.  Range of motion intact from full extension to 120 of flexion with periretinacular tenderness present  Specialty Comments:  No specialty comments available.  Imaging: No results found.   PMFS History: Patient Active Problem List   Diagnosis Date Noted  . Multiple joint pain 11/28/2016  . Low back pain with bilateral sciatica 11/28/2016  . Panic disorder without agoraphobia with moderate panic attacks 07/01/2015  . LLQ pain 07/01/2015  . Tinnitus 07/01/2015  . Chronic insomnia 10/03/2013  . Plantar fasciitis, bilateral 03/28/2013  . Fibromyalgia 06/26/2011  . MICROSCOPIC HEMATURIA 04/15/2010  . PARESTHESIA 09/28/2009  . PALPITATIONS, CHRONIC 09/28/2009  . Headache(784.0) 09/07/2009  . INTERNAL HEMORRHOIDS WITHOUT MENTION COMP 02/10/2009  . IBS 02/10/2009  . FATIGUE, CHRONIC 01/12/2009  . Chronic cough 10/02/2008  . GENITAL HERPES 02/12/2008  . Hyperlipemia 02/12/2008  . Anemia, iron deficiency 02/12/2008  . Major depressive disorder, recurrent episode, moderate (HCC) 02/12/2008  . COMMON MIGRAINE 02/12/2008  . Essential hypertension, benign 02/12/2008  . GERD 02/12/2008  . OSTEOARTHRITIS 02/12/2008  . ABDOMINAL PAIN, CHRONIC 02/12/2008  . HEART MURMUR, HX OF 02/12/2008   Past Medical History:  Diagnosis Date  . Abdominal pain, generalized   . Abdominal pain, unspecified site   . Acute cystitis   . Acute sinusitis, unspecified   . Anemia    Iron deficiency  . Arm pain, left   . Chest pain, unspecified   . Common migraine   . Cough   . Depression   . Dizziness and giddiness   . Elbow pain   . Elbow, forearm, and wrist, abrasion or friction burn, without mention of infection   . Genital herpes, unspecified   . GERD (gastroesophageal reflux disease)   . Headache(784.0)   . Heart murmur   . Hyperlipidemia   . Hypertension     . IBS (irritable bowel syndrome)   . Internal hemorrhoids without mention of complication   . Knee pain, left   . Microscopic hematuria   . Osteoarthrosis, unspecified whether generalized or localized, unspecified site   . Other malaise and fatigue   . Other screening mammogram   . Palpitations   . Paresthesia   . Routine general medical examination at a health care facility   . Routine gynecological examination   . Small bowel obstruction (HCC)   . Urinary tract infection, site not specified     Family History  Problem Relation Age of Onset  . Cancer Mother        Breast  . Cancer Father        Lung  . Heart disease Father        CABG  . Colon polyps Sister   . Cancer Maternal Aunt        Cirrhosis of liver    Past Surgical History:  Procedure Laterality Date  . Barium Follow Through  2007   because rectum twisted and couldn't  do colonoscopy, was painful though  . barium swallow  2006   Hiatal hernia  . BILATERAL OOPHORECTOMY  2006   For large cyst  . CARDIOVASCULAR STRESS TEST  09/2009   Low risk  . PARTIAL HYSTERECTOMY  1998   Vaginal   Social History   Occupational History  . Medical sales representative, [employer], [employer]    Social History Main Topics  . Smoking status: Never Smoker  . Smokeless tobacco: Never Used  . Alcohol use 0.0 oz/week  . Drug use: No     Comment: Remote but no injected.  Marland Kitchen Sexual activity: Not on file

## 2017-06-24 ENCOUNTER — Other Ambulatory Visit: Payer: Self-pay | Admitting: Family Medicine

## 2017-06-25 ENCOUNTER — Other Ambulatory Visit: Payer: Self-pay | Admitting: *Deleted

## 2017-06-25 MED ORDER — VENLAFAXINE HCL ER 150 MG PO CP24: ORAL_CAPSULE | ORAL | 1 refills | Status: DC

## 2017-06-26 ENCOUNTER — Telehealth (INDEPENDENT_AMBULATORY_CARE_PROVIDER_SITE_OTHER): Payer: Self-pay | Admitting: Orthopedic Surgery

## 2017-06-26 NOTE — Telephone Encounter (Signed)
Koleen NimrodAdrian from pre[facility] called stating that the patient has a pre-op appointment tomorrow at 11:00 a.m., but there are no orders in the system.  CB#[phone].  Thank you.

## 2017-06-26 NOTE — Pre-Procedure Instructions (Signed)
Mia Kline  06/26/2017      [id number] - [place], [place] - [address] [name] [id number] Phone: [phone] Fax: [phone]  [facility] [place] - [id number] [place] [address] [place] [id number] Phone: [phone] Fax: [phone]    Your procedure is scheduled on Monday, July 02, 2017  [facility]A" at 5:30AM   Call this number if you have problems the morning of surgery:  [phone]   Remember:  Do not eat food or drink liquids after midnight.  Take these medicines the morning of surgery with A SIP OF WATER: Pantoprazole (PROTONIX), ValACYclovir (VALTREX), and Venlafaxine XR (EFFEXOR-XR).  As of today, stop taking all Aspirins, Vitamins, Fish oils, and Herbal medications. Also stop all NSAIDS i.e. Advil, Ibuprofen, Motrin, Aleve, Anaprox, Naproxen, BC and Goody Powders.   Do not wear jewelry, make-up or nail polish.  Do not wear lotions, powders, perfumes, or deodorant.  Do not shave 48 hours prior to surgery.    Do not bring valuables to the hospital.  [facility] is not responsible for any belongings or valuables.  Contacts, dentures or bridgework may not be worn into surgery.  Leave your suitcase in the car.  After surgery it may be brought to your room.  For patients admitted to the hospital, discharge time will be determined by your treatment team.  Patients discharged the day of surgery will not be allowed to drive home.   Special instructions:   [facility]- Preparing For Surgery  Before surgery, you can play an important role. Because skin is not sterile, your skin needs to be as free of germs as possible. You can reduce the number of germs on your skin by washing with CHG (chlorahexidine gluconate) Soap before surgery.  CHG is an antiseptic cleaner which kills germs and bonds with the skin to continue killing germs even after  washing.  Please do not use if you have an allergy to CHG or antibacterial soaps. If your skin becomes reddened/irritated stop using the CHG.  Do not shave (including legs and underarms) for at least 48 hours prior to first CHG shower. It is OK to shave your face.  Please follow these instructions carefully.   1. Shower the NIGHT BEFORE SURGERY and the MORNING OF SURGERY with CHG.   2. If you chose to wash your hair, wash your hair first as usual with your normal shampoo.  3. After you shampoo, rinse your hair and body thoroughly to remove the shampoo.  4. Use CHG as you would any other liquid soap. You can apply CHG directly to the skin and wash gently with a scrungie or a clean washcloth.   5. Apply the CHG Soap to your body ONLY FROM THE NECK DOWN.  Do not use on open wounds or open sores. Avoid contact with your eyes, ears, mouth and genitals (private parts). Wash Face and genitals (private parts)  with your normal soap.  6. Wash thoroughly, paying special attention to the area where your surgery will be performed.  7. Thoroughly rinse your body with warm water from the neck down.  8. DO NOT shower/wash with your normal soap after using and rinsing off the CHG Soap.  9. Pat yourself dry with a CLEAN TOWEL.  10. Wear CLEAN PAJAMAS to bed the night before surgery, wear comfortable clothes the morning of surgery  11. Place CLEAN SHEETS  on your bed the night of your first shower and DO NOT SLEEP WITH PETS.  Day of Surgery: Do not apply any deodorants/lotions. Please wear clean clothes to the hospital/surgery center.    Please read over the following fact sheets that you were given. Pain Booklet, Coughing and Deep Breathing, Total Joint Packet, MRSA Information and Surgical Site Infection Prevention

## 2017-06-26 NOTE — Telephone Encounter (Signed)
Doesn't sherry do this?

## 2017-06-26 NOTE — Telephone Encounter (Signed)
Orders needed

## 2017-06-27 ENCOUNTER — Other Ambulatory Visit: Payer: Self-pay

## 2017-06-27 ENCOUNTER — Other Ambulatory Visit (INDEPENDENT_AMBULATORY_CARE_PROVIDER_SITE_OTHER): Payer: Self-pay | Admitting: Orthopedic Surgery

## 2017-06-27 ENCOUNTER — Encounter (HOSPITAL_COMMUNITY): Payer: Self-pay

## 2017-06-27 ENCOUNTER — Encounter (HOSPITAL_COMMUNITY)
Admission: RE | Admit: 2017-06-27 | Discharge: 2017-06-27 | Disposition: A | Discharge status: Home / Self Care | Payer: 59 | Source: Ambulatory Visit | Attending: Orthopedic Surgery | Admitting: Orthopedic Surgery

## 2017-06-27 ENCOUNTER — Other Ambulatory Visit (INDEPENDENT_AMBULATORY_CARE_PROVIDER_SITE_OTHER): Payer: Self-pay | Admitting: Radiology

## 2017-06-27 DIAGNOSIS — Z01818 Encounter for other preprocedural examination: Secondary | ICD-10-CM | POA: Insufficient documentation

## 2017-06-27 DIAGNOSIS — M1712 Unilateral primary osteoarthritis, left knee: Secondary | ICD-10-CM

## 2017-06-27 HISTORY — DX: Nausea with vomiting, unspecified: R11.2

## 2017-06-27 HISTORY — DX: Other specified postprocedural states: Z98.890

## 2017-06-27 HISTORY — DX: Adverse effect of unspecified anesthetic, initial encounter: T41.45XA

## 2017-06-27 HISTORY — DX: Other complications of anesthesia, initial encounter: T88.59XA

## 2017-06-27 LAB — BASIC METABOLIC PANEL
Anion gap: 9 (ref 5–15)
BUN: 22 mg/dL — ABNORMAL HIGH (ref 6–20)
CO2: 27 mmol/L (ref 22–32)
Calcium: 9 mg/dL (ref 8.9–10.3)
Chloride: 102 mmol/L (ref 101–111)
Creatinine, Ser: 0.78 mg/dL (ref 0.44–1.00)
GFR calc Af Amer: >60 mL/min (ref >60)
GFR calc non Af Amer: >60 mL/min (ref >60)
Glucose, Bld: 94 mg/dL (ref 65–99)
Potassium: 3.3 mmol/L — ABNORMAL LOW (ref 3.5–5.1)
Sodium: 138 mmol/L (ref 135–145)

## 2017-06-27 LAB — CBC
HCT: 39.7 % (ref 36.0–46.0)
Hemoglobin: 12.2 g/dL (ref 12.0–15.0)
MCH: 25.1 pg — ABNORMAL LOW (ref 26.0–34.0)
MCHC: 30.7 g/dL (ref 30.0–36.0)
MCV: 81.5 fL (ref 78.0–100.0)
Platelets: 234 10*3/uL (ref 150–400)
RBC: 4.87 MIL/uL (ref 3.87–5.11)
RDW: 15.9 % — ABNORMAL HIGH (ref 11.5–15.5)
WBC: 7.1 10*3/uL (ref 4.0–10.5)

## 2017-06-27 LAB — URINALYSIS, ROUTINE W REFLEX MICROSCOPIC
Bilirubin Urine: NEGATIVE
Glucose, UA: NEGATIVE mg/dL
Hgb urine dipstick: NEGATIVE
Ketones, ur: NEGATIVE mg/dL
LEUKOCYTES UA: NEGATIVE
NITRITE: NEGATIVE
PROTEIN: NEGATIVE mg/dL
SPECIFIC GRAVITY, URINE: 1.02 (ref 1.005–1.030)
pH: 6 (ref 5.0–8.0)

## 2017-06-27 LAB — SURGICAL PCR SCREEN
MRSA, PCR: NEGATIVE
STAPHYLOCOCCUS AUREUS: NEGATIVE

## 2017-06-27 LAB — TYPE AND SCREEN
ABO/RH(D): O POS
Antibody Screen: NEGATIVE

## 2017-06-27 LAB — ABO/RH: ABO/RH(D): O POS

## 2017-06-27 NOTE — Telephone Encounter (Signed)
IC s/w patient and advised  

## 2017-06-27 NOTE — Telephone Encounter (Signed)
Please advise if she will be needing P.T. Post operatively. Thanks.

## 2017-06-27 NOTE — Telephone Encounter (Signed)
Yes home health physical therapy for first 3 weeks in [place]  [facility]

## 2017-06-27 NOTE — Progress Notes (Signed)
PCP - Dr. Trudie ReedAmy [facility]  Cardiologist - Denies  Chest x-ray - Denies  EKG - 06/27/17  Stress Test - More than 5 yrs  ECHO - Denies  Cardiac Cath - Denies  Sleep Study - Yes-Negative CPAP - None   Pt denies having chest pain, sob, or fever at this time. All instructions explained to the pt, with a verbal understanding of the material. Pt agrees to go over the instructions while at home for a better understanding. The opportunity to ask questions was provided.

## 2017-06-27 NOTE — Telephone Encounter (Signed)
Orders needed. 11 am appt today.

## 2017-06-27 NOTE — Telephone Encounter (Signed)
Mia Kline, Aeron  February 12, 1956  [phone] work number Leave VM  Home phone Leave [employer]  [phone]     Pt called and has surgery on Monday and would like to know if she will need Physical Therapy.

## 2017-06-28 LAB — URINE CULTURE: Culture: NO GROWTH

## 2017-06-29 MED ORDER — CEFAZOLIN SODIUM-DEXTROSE 2-4 GM/100ML-% IV SOLN
2.0000 g | INTRAVENOUS | Status: AC
  Administered 2017-07-02: 2 g via INTRAVENOUS
  Filled 2017-06-29: qty 100

## 2017-06-29 MED ORDER — TRANEXAMIC ACID 1000 MG/10ML IV SOLN
1000.0000 mg | INTRAVENOUS | Status: AC
  Administered 2017-07-02: 1000 mg via INTRAVENOUS
  Filled 2017-06-29: qty 1100

## 2017-06-29 NOTE — Progress Notes (Signed)
Dr. Diamantina Providenceean's office called and stated MD wanted to change the surgical procedure on the consent form. New orders received and carried out.

## 2017-07-01 NOTE — Anesthesia Preprocedure Evaluation (Addendum)
Anesthesia Evaluation  Patient identified by MRN, date of birth, ID band Patient awake    Reviewed: Allergy & Precautions, NPO status , Patient's Chart, lab work & pertinent test results  History of Anesthesia Complications (+) PONV  Airway Mallampati: II       Dental  (+) Teeth Intact, Dental Advisory Given   Pulmonary neg pulmonary ROS,    breath sounds clear to auscultation       Cardiovascular hypertension, Pt. on medications  Rhythm:Irregular Rate:Normal     Neuro/Psych  Headaches, Anxiety Depression  Neuromuscular disease    GI/Hepatic Neg liver ROS, GERD  Medicated and Controlled,  Endo/Other  Morbid obesity  Renal/GU      Musculoskeletal  (+) Arthritis , Fibromyalgia -  Abdominal (+) + obese,   Peds  Hematology  (+) anemia ,   Anesthesia Other Findings   Reproductive/Obstetrics                            Anesthesia Physical Anesthesia Plan  ASA: III  Anesthesia Plan: General   Post-op Pain Management:  Regional for Post-op pain   Induction: Intravenous  PONV Risk Score and Plan: 4 or greater and Treatment may vary due to age or medical condition, Dexamethasone, Ondansetron and Midazolam  Airway Management Planned: Natural Airway, Simple Face Mask and Oral ETT  Additional Equipment:   Intra-op Plan:   Post-operative Plan: Extubation in OR  Informed Consent: I have reviewed the patients History and Physical, chart, labs and discussed the procedure including the risks, benefits and alternatives for the proposed anesthesia with the patient or authorized representative who has indicated his/her understanding and acceptance.   Dental advisory given  Plan Discussed with: CRNA, Anesthesiologist and Surgeon  Anesthesia Plan Comments:        Anesthesia Quick Evaluation

## 2017-07-01 NOTE — H&P (Signed)
Mia Kline is an 61 y.o. female.   Chief Complaint: Left knee pain HPI: Mia PoundDeborah is a 61 year old female with long history of left knee pain.  She has had multiple injections and treatment modalities.  She has primarily patellofemoral arthritis performed on MRI scan from several years ago.  She reports continued pain swelling and difficulty with weightbearing particularly going up and down stairs.  No family history or personal history of DVT or pulmonary embolism.  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  She lives alone but has a friend who is coming to stay with her during her convalescence.  Past Medical History:  Diagnosis Date  . Abdominal pain, generalized   . Abdominal pain, unspecified site   . Acute cystitis   . Acute sinusitis, unspecified   . Anemia    Iron deficiency  . Arm pain, left   . Chest pain, unspecified   . Common migraine   . Complication of anesthesia   . Cough   . Depression   . Dizziness and giddiness   . Elbow pain   . Elbow, forearm, and wrist, abrasion or friction burn, without mention of infection   . Genital herpes, unspecified   . GERD (gastroesophageal reflux disease)   . Headache(784.0)   . Heart murmur   . Hyperlipidemia   . Hypertension   . IBS (irritable bowel syndrome)   . Internal hemorrhoids without mention of complication   . Knee pain, left   . Microscopic hematuria   . Osteoarthrosis, unspecified whether generalized or localized, unspecified site   . Other malaise and fatigue   . Other screening mammogram   . Palpitations   . Paresthesia   . PONV (postoperative nausea and vomiting)   . Routine general medical examination at a health care facility   . Routine gynecological examination   . Small bowel obstruction (HCC)   . Urinary tract infection, site not specified     Past Surgical History:  Procedure Laterality Date  . ABDOMINAL HYSTERECTOMY    . Barium Follow Through  2007   because rectum twisted and couldn't do colonoscopy, was painful though  . barium swallow  2006   Hiatal hernia  . BILATERAL OOPHORECTOMY  2006   For large cyst  . CARDIOVASCULAR STRESS TEST  09/2009   Low risk  . PARTIAL HYSTERECTOMY  1998   Vaginal  . TUBAL LIGATION      Family History  Problem Relation Age of Onset  . Cancer Mother        Breast  . Cancer Father        Lung  . Heart disease Father        CABG  . Colon polyps Sister   . Cancer Maternal Aunt        Cirrhosis of liver   Social History:  reports that  has never smoked. she has never used smokeless tobacco. She reports that she drinks alcohol. She reports that she does not use drugs.  Allergies:  Allergies  Allergen Reactions  . Wellbutrin [Bupropion] Hives  . Codeine Nausea Only    No medications prior to admission.    No results found for this or any previous visit (from the past 48 hour(s)). No results found.  Review of Systems  Musculoskeletal: Positive for joint pain.  All other systems reviewed and are negative.   There were no vitals taken for this visit. Physical Exam  Constitutional: She appears well-developed.  HENT:  Head: Normocephalic.  Eyes: Pupils are equal, round, and reactive to light.  Neck: Normal range of motion.  Cardiovascular: Normal rate.  Respiratory: Effort normal.  Neurological: She is alert.  Skin: Skin is warm.  Psychiatric: She has a normal mood and affect.  Examination of the left knee demonstrates mild effusion with good range of motion full extension to 20 of flexion.  Significant periretinacular tenderness and patellofemoral crepitus is present in both knees.  Mild medial and lateral joint line tenderness is present.  Alignment is normal particularly in relation to the Q angle.  Negative patellar apprehension.  Assessment/Plan Impression is left knee pain patellofemoral arthritis supple less severe arthritis in the medial and lateral compartments.  He is knee  arthrotomy with patellofemoral versus total knee replacement.  Discussed including but not limited to infection nerve vessel damage incomplete pain relief as well as the likelihood of revision in her lifetime.  The decision between total knee replacement and patellofemoral replacement can be made at the time of surgery.  Based on her last MRI scan she would be potentially a candidate for patellofemoral replacement if her medial and lateral compartments and menisci are pristine.  Patient understands the risk and benefits of surgery.  All questions answered.  Today her back 2 weeks after surgery.  Burnard BuntingG Scott Jeanell Mangan, MD 07/01/2017, 10:01 PM

## 2017-07-02 ENCOUNTER — Encounter (HOSPITAL_COMMUNITY)
Admission: RE | Disposition: A | Discharge status: Home Health | Payer: Self-pay | Source: Ambulatory Visit | Attending: Orthopedic Surgery

## 2017-07-02 ENCOUNTER — Inpatient Hospital Stay (HOSPITAL_COMMUNITY): Payer: 59 | Admitting: Anesthesiology

## 2017-07-02 ENCOUNTER — Inpatient Hospital Stay (HOSPITAL_COMMUNITY)
Admission: RE | Admit: 2017-07-02 | Discharge: 2017-07-04 | DRG: 470 | Disposition: A | Discharge status: Home Health | Payer: 59 | Source: Ambulatory Visit | Attending: Orthopedic Surgery | Admitting: Orthopedic Surgery

## 2017-07-02 ENCOUNTER — Encounter (HOSPITAL_COMMUNITY): Payer: Self-pay | Admitting: *Deleted

## 2017-07-02 DIAGNOSIS — M1710 Unilateral primary osteoarthritis, unspecified knee: Secondary | ICD-10-CM | POA: Diagnosis present

## 2017-07-02 DIAGNOSIS — Z79899 Other long term (current) drug therapy: Secondary | ICD-10-CM | POA: Diagnosis not present

## 2017-07-02 DIAGNOSIS — F418 Other specified anxiety disorders: Secondary | ICD-10-CM | POA: Diagnosis present

## 2017-07-02 DIAGNOSIS — I10 Essential (primary) hypertension: Secondary | ICD-10-CM | POA: Diagnosis present

## 2017-07-02 DIAGNOSIS — G709 Myoneural disorder, unspecified: Secondary | ICD-10-CM | POA: Diagnosis present

## 2017-07-02 DIAGNOSIS — Z885 Allergy status to narcotic agent status: Secondary | ICD-10-CM

## 2017-07-02 DIAGNOSIS — M1712 Unilateral primary osteoarthritis, left knee: Secondary | ICD-10-CM | POA: Diagnosis not present

## 2017-07-02 DIAGNOSIS — Z90710 Acquired absence of both cervix and uterus: Secondary | ICD-10-CM

## 2017-07-02 DIAGNOSIS — E785 Hyperlipidemia, unspecified: Secondary | ICD-10-CM | POA: Diagnosis present

## 2017-07-02 DIAGNOSIS — Z8249 Family history of ischemic heart disease and other diseases of the circulatory system: Secondary | ICD-10-CM

## 2017-07-02 DIAGNOSIS — M25762 Osteophyte, left knee: Secondary | ICD-10-CM | POA: Diagnosis present

## 2017-07-02 DIAGNOSIS — K219 Gastro-esophageal reflux disease without esophagitis: Secondary | ICD-10-CM | POA: Diagnosis present

## 2017-07-02 DIAGNOSIS — I499 Cardiac arrhythmia, unspecified: Secondary | ICD-10-CM | POA: Diagnosis present

## 2017-07-02 DIAGNOSIS — Z6836 Body mass index (BMI) 36.0-36.9, adult: Secondary | ICD-10-CM | POA: Diagnosis not present

## 2017-07-02 DIAGNOSIS — Z90722 Acquired absence of ovaries, bilateral: Secondary | ICD-10-CM

## 2017-07-02 DIAGNOSIS — Z96652 Presence of left artificial knee joint: Secondary | ICD-10-CM | POA: Diagnosis not present

## 2017-07-02 DIAGNOSIS — R51 Headache: Secondary | ICD-10-CM | POA: Diagnosis present

## 2017-07-02 DIAGNOSIS — D649 Anemia, unspecified: Secondary | ICD-10-CM | POA: Diagnosis present

## 2017-07-02 DIAGNOSIS — G8918 Other acute postprocedural pain: Secondary | ICD-10-CM | POA: Diagnosis not present

## 2017-07-02 DIAGNOSIS — Z888 Allergy status to other drugs, medicaments and biological substances status: Secondary | ICD-10-CM

## 2017-07-02 HISTORY — DX: Unilateral primary osteoarthritis, unspecified knee: M17.10

## 2017-07-02 HISTORY — PX: TOTAL KNEE ARTHROPLASTY: SHX125

## 2017-07-02 SURGERY — ARTHROPLASTY, KNEE, TOTAL
Anesthesia: General | Site: Knee | Laterality: Left

## 2017-07-02 MED ORDER — BUPIVACAINE HCL (PF) 0.5 % IJ SOLN
INTRAMUSCULAR | Status: AC
  Filled 2017-07-02: qty 30

## 2017-07-02 MED ORDER — MIDAZOLAM HCL 2 MG/2ML IJ SOLN
INTRAMUSCULAR | Status: AC
  Filled 2017-07-02: qty 2

## 2017-07-02 MED ORDER — METOCLOPRAMIDE HCL 5 MG PO TABS: 5.0000 mg | ORAL_TABLET | Freq: Three times a day (TID) | ORAL | Status: DC | PRN

## 2017-07-02 MED ORDER — METOCLOPRAMIDE HCL 5 MG/ML IJ SOLN: 5.0000 mg | Freq: Three times a day (TID) | INTRAMUSCULAR | Status: DC | PRN

## 2017-07-02 MED ORDER — FENTANYL CITRATE (PF) 100 MCG/2ML IJ SOLN
25.0000 ug | INTRAMUSCULAR | Status: DC | PRN
  Administered 2017-07-02 (×3): 50 ug via INTRAVENOUS

## 2017-07-02 MED ORDER — CHLORHEXIDINE GLUCONATE 4 % EX LIQD: 60.0000 mL | Freq: Once | CUTANEOUS | Status: DC

## 2017-07-02 MED ORDER — ONDANSETRON HCL 4 MG/2ML IJ SOLN
INTRAMUSCULAR | Status: AC
  Filled 2017-07-02: qty 2

## 2017-07-02 MED ORDER — HYDROMORPHONE HCL 2 MG PO TABS
2.0000 mg | ORAL_TABLET | ORAL | Status: DC | PRN
  Administered 2017-07-02 – 2017-07-04 (×10): 2 mg via ORAL
  Filled 2017-07-02 (×10): qty 1

## 2017-07-02 MED ORDER — VENLAFAXINE HCL ER 150 MG PO CP24
150.0000 mg | ORAL_CAPSULE | Freq: Every day | ORAL | Status: DC
  Administered 2017-07-03 – 2017-07-04 (×2): 150 mg via ORAL
  Filled 2017-07-02 (×2): qty 1

## 2017-07-02 MED ORDER — PROPOFOL 10 MG/ML IV BOLUS
INTRAVENOUS | Status: AC
  Filled 2017-07-02: qty 20

## 2017-07-02 MED ORDER — PROPOFOL 10 MG/ML IV BOLUS
INTRAVENOUS | Status: DC | PRN
  Administered 2017-07-02: 150 mg via INTRAVENOUS

## 2017-07-02 MED ORDER — LIDOCAINE HCL (CARDIAC) 20 MG/ML IV SOLN
INTRAVENOUS | Status: DC | PRN
  Administered 2017-07-02: 60 mg via INTRAVENOUS

## 2017-07-02 MED ORDER — BUPIVACAINE LIPOSOME 1.3 % IJ SUSP
20.0000 mL | Freq: Once | INTRAMUSCULAR | Status: DC
  Filled 2017-07-02: qty 20

## 2017-07-02 MED ORDER — FENTANYL CITRATE (PF) 100 MCG/2ML IJ SOLN
INTRAMUSCULAR | Status: AC
  Administered 2017-07-02: 50 ug via INTRAVENOUS
  Filled 2017-07-02: qty 2

## 2017-07-02 MED ORDER — CALCIUM CARBONATE-VITAMIN D 500-200 MG-UNIT PO TABS
ORAL_TABLET | Freq: Every day | ORAL | Status: DC
  Administered 2017-07-03 – 2017-07-04 (×2): 1 via ORAL
  Filled 2017-07-02 (×4): qty 1

## 2017-07-02 MED ORDER — EPHEDRINE 5 MG/ML INJ
INTRAVENOUS | Status: AC
  Filled 2017-07-02: qty 10

## 2017-07-02 MED ORDER — VITAMIN D 1000 UNITS PO TABS
1000.0000 [IU] | ORAL_TABLET | Freq: Every day | ORAL | Status: DC
  Administered 2017-07-03 – 2017-07-04 (×2): 1000 [IU] via ORAL
  Filled 2017-07-02 (×4): qty 1

## 2017-07-02 MED ORDER — FENTANYL CITRATE (PF) 250 MCG/5ML IJ SOLN
INTRAMUSCULAR | Status: AC
  Filled 2017-07-02: qty 5

## 2017-07-02 MED ORDER — MORPHINE SULFATE (PF) 4 MG/ML IV SOLN: 4.0000 mg | INTRAVENOUS | Status: DC | PRN

## 2017-07-02 MED ORDER — MORPHINE SULFATE (PF) 4 MG/ML IV SOLN
INTRAVENOUS | Status: AC
  Filled 2017-07-02: qty 2

## 2017-07-02 MED ORDER — CLONIDINE HCL (ANALGESIA) 100 MCG/ML EP SOLN: EPIDURAL | Status: DC | PRN

## 2017-07-02 MED ORDER — HYDROMORPHONE HCL 1 MG/ML IJ SOLN: 0.5000 mg | INTRAMUSCULAR | Status: DC | PRN

## 2017-07-02 MED ORDER — BUPIVACAINE LIPOSOME 1.3 % IJ SUSP
INTRAMUSCULAR | Status: DC | PRN
  Administered 2017-07-02: 20 mL

## 2017-07-02 MED ORDER — SUCCINYLCHOLINE CHLORIDE 200 MG/10ML IV SOSY
PREFILLED_SYRINGE | INTRAVENOUS | Status: AC
  Filled 2017-07-02: qty 10

## 2017-07-02 MED ORDER — POTASSIUM CHLORIDE IN NACL 20-0.9 MEQ/L-% IV SOLN
INTRAVENOUS | Status: AC
  Administered 2017-07-02: 13:00:00 via INTRAVENOUS
  Filled 2017-07-02: qty 1000

## 2017-07-02 MED ORDER — ONDANSETRON HCL 4 MG/2ML IJ SOLN
INTRAMUSCULAR | Status: DC | PRN
  Administered 2017-07-02: 4 mg via INTRAVENOUS

## 2017-07-02 MED ORDER — ONDANSETRON HCL 4 MG/2ML IJ SOLN
4.0000 mg | Freq: Four times a day (QID) | INTRAMUSCULAR | Status: DC | PRN
  Administered 2017-07-02: 4 mg via INTRAVENOUS
  Filled 2017-07-02: qty 2

## 2017-07-02 MED ORDER — ROCURONIUM BROMIDE 100 MG/10ML IV SOLN
INTRAVENOUS | Status: DC | PRN
  Administered 2017-07-02 (×2): 10 mg via INTRAVENOUS
  Administered 2017-07-02: 50 mg via INTRAVENOUS
  Administered 2017-07-02: 10 mg via INTRAVENOUS

## 2017-07-02 MED ORDER — ONDANSETRON HCL 4 MG PO TABS: 4.0000 mg | ORAL_TABLET | Freq: Four times a day (QID) | ORAL | Status: DC | PRN

## 2017-07-02 MED ORDER — 0.9 % SODIUM CHLORIDE (POUR BTL) OPTIME
TOPICAL | Status: DC | PRN
Start: 2017-07-02 — End: 2017-07-02
  Administered 2017-07-02 (×2): 1000 mL

## 2017-07-02 MED ORDER — MORPHINE SULFATE (PF) 4 MG/ML IV SOLN
INTRAVENOUS | Status: DC | PRN
  Administered 2017-07-02: 8 mg via INTRAVENOUS

## 2017-07-02 MED ORDER — DEXAMETHASONE SODIUM PHOSPHATE 10 MG/ML IJ SOLN
INTRAMUSCULAR | Status: DC | PRN
  Administered 2017-07-02: 10 mg via INTRAVENOUS

## 2017-07-02 MED ORDER — CLONIDINE HCL (ANALGESIA) 100 MCG/ML EP SOLN
150.0000 ug | Freq: Once | EPIDURAL | Status: DC
  Filled 2017-07-02: qty 1.5

## 2017-07-02 MED ORDER — LACTATED RINGERS IV SOLN
INTRAVENOUS | Status: DC | PRN
  Administered 2017-07-02 (×2): via INTRAVENOUS

## 2017-07-02 MED ORDER — HYDROCHLOROTHIAZIDE 25 MG PO TABS
25.0000 mg | ORAL_TABLET | Freq: Every day | ORAL | Status: DC
  Administered 2017-07-03 – 2017-07-04 (×2): 25 mg via ORAL
  Filled 2017-07-02 (×2): qty 1

## 2017-07-02 MED ORDER — ACETAMINOPHEN 650 MG RE SUPP: 650.0000 mg | RECTAL | Status: DC | PRN

## 2017-07-02 MED ORDER — MIDAZOLAM HCL 5 MG/5ML IJ SOLN
INTRAMUSCULAR | Status: DC | PRN
  Administered 2017-07-02: 2 mg via INTRAVENOUS

## 2017-07-02 MED ORDER — METHOCARBAMOL 500 MG PO TABS
500.0000 mg | ORAL_TABLET | Freq: Four times a day (QID) | ORAL | Status: DC | PRN
  Administered 2017-07-02 – 2017-07-04 (×5): 500 mg via ORAL
  Filled 2017-07-02 (×5): qty 1

## 2017-07-02 MED ORDER — METHOCARBAMOL 1000 MG/10ML IJ SOLN: 500.0000 mg | Freq: Four times a day (QID) | INTRAVENOUS | Status: DC | PRN

## 2017-07-02 MED ORDER — FENTANYL CITRATE (PF) 100 MCG/2ML IJ SOLN
INTRAMUSCULAR | Status: DC | PRN
  Administered 2017-07-02: 50 ug via INTRAVENOUS
  Administered 2017-07-02: 100 ug via INTRAVENOUS
  Administered 2017-07-02: 50 ug via INTRAVENOUS

## 2017-07-02 MED ORDER — PHENOL 1.4 % MT LIQD: 1.0000 | OROMUCOSAL | Status: DC | PRN

## 2017-07-02 MED ORDER — ACETAMINOPHEN 325 MG PO TABS: 650.0000 mg | ORAL_TABLET | ORAL | Status: DC | PRN

## 2017-07-02 MED ORDER — METHOCARBAMOL 500 MG PO TABS
ORAL_TABLET | ORAL | Status: AC
  Administered 2017-07-02: 500 mg via ORAL
  Filled 2017-07-02: qty 1

## 2017-07-02 MED ORDER — MENTHOL 3 MG MT LOZG: 1.0000 | LOZENGE | OROMUCOSAL | Status: DC | PRN

## 2017-07-02 MED ORDER — SODIUM CHLORIDE 0.9 % IR SOLN
Status: DC | PRN
  Administered 2017-07-02: 3000 mL

## 2017-07-02 MED ORDER — BUPIVACAINE HCL 0.5 % IJ SOLN
INTRAMUSCULAR | Status: DC | PRN
  Administered 2017-07-02: 30 mL

## 2017-07-02 MED ORDER — CLONIDINE HCL (ANALGESIA) 100 MCG/ML EP SOLN
150.0000 ug | Freq: Once | EPIDURAL | Status: AC
  Administered 2017-07-02: 1 mL via INTRA_ARTICULAR
  Filled 2017-07-02: qty 1.5

## 2017-07-02 MED ORDER — SODIUM CHLORIDE 0.9% FLUSH
INTRAVENOUS | Status: DC | PRN
  Administered 2017-07-02: 20 mL

## 2017-07-02 MED ORDER — TRANEXAMIC ACID 1000 MG/10ML IV SOLN
2000.0000 mg | Freq: Once | INTRAVENOUS | Status: AC
  Administered 2017-07-02: 2000 mg via TOPICAL
  Filled 2017-07-02: qty 20

## 2017-07-02 MED ORDER — PANTOPRAZOLE SODIUM 40 MG PO TBEC
40.0000 mg | DELAYED_RELEASE_TABLET | Freq: Every day | ORAL | Status: DC
  Administered 2017-07-03 – 2017-07-04 (×2): 40 mg via ORAL
  Filled 2017-07-02 (×2): qty 1

## 2017-07-02 MED ORDER — RIVAROXABAN 10 MG PO TABS
10.0000 mg | ORAL_TABLET | Freq: Every day | ORAL | Status: DC
  Administered 2017-07-03 – 2017-07-04 (×2): 10 mg via ORAL
  Filled 2017-07-02 (×2): qty 1

## 2017-07-02 MED ORDER — SIMVASTATIN 40 MG PO TABS
40.0000 mg | ORAL_TABLET | Freq: Every day | ORAL | Status: DC
  Administered 2017-07-02 – 2017-07-03 (×2): 40 mg via ORAL
  Filled 2017-07-02 (×2): qty 1

## 2017-07-02 MED ORDER — OXYCODONE HCL 5 MG PO TABS: 10.0000 mg | ORAL_TABLET | ORAL | Status: DC | PRN

## 2017-07-02 MED ORDER — SUGAMMADEX SODIUM 200 MG/2ML IV SOLN
INTRAVENOUS | Status: DC | PRN
  Administered 2017-07-02: 200 mg via INTRAVENOUS

## 2017-07-02 SURGICAL SUPPLY — 66 items
ALCOHOL 70% 16 OZ (MISCELLANEOUS) ×2 IMPLANT
BANDAGE ELASTIC 4 VELCRO ST LF (GAUZE/BANDAGES/DRESSINGS) ×2 IMPLANT
BANDAGE ESMARK 6X9 LF (GAUZE/BANDAGES/DRESSINGS) ×1 IMPLANT
BLADE SAG 18X100X1.27 (BLADE) ×2 IMPLANT
BLADE SAW SGTL 13.0X1.19X90.0M (BLADE) ×2 IMPLANT
BNDG COHESIVE 6X5 TAN STRL LF (GAUZE/BANDAGES/DRESSINGS) ×4 IMPLANT
BNDG ELASTIC 6X15 VLCR STRL LF (GAUZE/BANDAGES/DRESSINGS) ×2 IMPLANT
BNDG ESMARK 6X9 LF (GAUZE/BANDAGES/DRESSINGS) ×2
CAPT KNEE TRIATH TK-4 ×2 IMPLANT
CONT SPECI 4OZ STER CLIK (MISCELLANEOUS) ×2 IMPLANT
COVER SURGICAL LIGHT HANDLE (MISCELLANEOUS) ×4 IMPLANT
CUFF TOURNIQUET SINGLE 34IN LL (TOURNIQUET CUFF) ×2 IMPLANT
DECANTER SPIKE VIAL GLASS SM (MISCELLANEOUS) ×2 IMPLANT
DRAPE ORTHO SPLIT 77X108 STRL (DRAPES) ×3
DRAPE SURG ORHT 6 SPLT 77X108 (DRAPES) ×3 IMPLANT
DRAPE U-SHAPE 47X51 STRL (DRAPES) ×2 IMPLANT
DRSG AQUACEL AG ADV 3.5X14 (GAUZE/BANDAGES/DRESSINGS) ×2 IMPLANT
DURAPREP 26ML APPLICATOR (WOUND CARE) ×4 IMPLANT
ELECT CAUTERY BLADE 6.4 (BLADE) ×2 IMPLANT
ELECT REM PT RETURN 9FT ADLT (ELECTROSURGICAL) ×2
ELECTRODE REM PT RTRN 9FT ADLT (ELECTROSURGICAL) ×1 IMPLANT
GAUZE SPONGE 4X4 12PLY STRL (GAUZE/BANDAGES/DRESSINGS) ×2 IMPLANT
GLOVE BIOGEL PI IND STRL 6.5 (GLOVE) ×1 IMPLANT
GLOVE BIOGEL PI IND STRL 8 (GLOVE) ×2 IMPLANT
GLOVE BIOGEL PI INDICATOR 6.5 (GLOVE) ×1
GLOVE BIOGEL PI INDICATOR 8 (GLOVE) ×2
GLOVE SURG ORTHO 8.0 STRL STRW (GLOVE) ×2 IMPLANT
GLOVE SURG SS PI 6.5 STRL IVOR (GLOVE) ×4 IMPLANT
GLOVE SURG SS PI 8.0 STRL IVOR (GLOVE) ×4 IMPLANT
GOWN STRL REUS W/ TWL LRG LVL3 (GOWN DISPOSABLE) ×2 IMPLANT
GOWN STRL REUS W/TWL 2XL LVL3 (GOWN DISPOSABLE) ×2 IMPLANT
GOWN STRL REUS W/TWL LRG LVL3 (GOWN DISPOSABLE) ×2
HANDPIECE INTERPULSE COAX TIP (DISPOSABLE) ×1
HOOD PEEL AWAY FLYTE STAYCOOL (MISCELLANEOUS) ×6 IMPLANT
HYDROGEN PEROXIDE 16OZ (MISCELLANEOUS) ×2 IMPLANT
IMMOBILIZER KNEE 22 UNIV (SOFTGOODS) ×2 IMPLANT
KIT BASIN OR (CUSTOM PROCEDURE TRAY) ×2 IMPLANT
KIT ROOM TURNOVER OR (KITS) ×2 IMPLANT
MANIFOLD NEPTUNE II (INSTRUMENTS) ×2 IMPLANT
NDL SAFETY ECLIPSE 18X1.5 (NEEDLE) ×1 IMPLANT
NEEDLE HYPO 18GX1.5 SHARP (NEEDLE) ×1
NEEDLE HYPO 22GX1.5 SAFETY (NEEDLE) ×2 IMPLANT
NEEDLE SPNL 18GX3.5 QUINCKE PK (NEEDLE) ×2 IMPLANT
NS IRRIG 1000ML POUR BTL (IV SOLUTION) ×2 IMPLANT
PACK TOTAL JOINT (CUSTOM PROCEDURE TRAY) ×2 IMPLANT
PAD ABD 8X10 STRL (GAUZE/BANDAGES/DRESSINGS) ×4 IMPLANT
PAD ARMBOARD 7.5X6 YLW CONV (MISCELLANEOUS) ×2 IMPLANT
PAD CAST 4YDX4 CTTN HI CHSV (CAST SUPPLIES) ×1 IMPLANT
PADDING CAST COTTON 4X4 STRL (CAST SUPPLIES) ×1
PADDING CAST COTTON 6X4 STRL (CAST SUPPLIES) ×4 IMPLANT
SET HNDPC FAN SPRY TIP SCT (DISPOSABLE) ×1 IMPLANT
SOLUTION BETADINE 4OZ (MISCELLANEOUS) ×2 IMPLANT
SPONGE LAP 18X18 X RAY DECT (DISPOSABLE) ×2 IMPLANT
STRIP CLOSURE SKIN 1/2X4 (GAUZE/BANDAGES/DRESSINGS) ×2 IMPLANT
SUCTION FRAZIER HANDLE 10FR (MISCELLANEOUS) ×1
SUCTION TUBE FRAZIER 10FR DISP (MISCELLANEOUS) ×1 IMPLANT
SUT MNCRL AB 3-0 PS2 18 (SUTURE) ×4 IMPLANT
SUT VIC AB 0 CT1 27 (SUTURE) ×2
SUT VIC AB 0 CT1 27XBRD ANBCTR (SUTURE) ×2 IMPLANT
SUT VIC AB 1 CT1 27 (SUTURE) ×5
SUT VIC AB 1 CT1 27XBRD ANBCTR (SUTURE) ×5 IMPLANT
SUT VIC AB 2-0 CT1 27 (SUTURE) ×4
SUT VIC AB 2-0 CT1 TAPERPNT 27 (SUTURE) ×4 IMPLANT
SYR 30ML LL (SYRINGE) ×6 IMPLANT
SYR TB 1ML LUER SLIP (SYRINGE) ×2 IMPLANT
WATER STERILE IRR 1000ML POUR (IV SOLUTION) IMPLANT

## 2017-07-02 NOTE — Anesthesia Procedure Notes (Signed)
Procedure Name: Intubation Date/Time: 07/02/2017 7:38 AM Performed by: Carney Living, CRNA Pre-anesthesia Checklist: Patient identified, Emergency Drugs available, Suction available, Patient being monitored and Timeout performed Patient Re-evaluated:Patient Re-evaluated prior to induction Oxygen Delivery Method: Circle system utilized Preoxygenation: Pre-oxygenation with 100% oxygen Induction Type: IV induction Ventilation: Mask ventilation without difficulty and Oral airway inserted - appropriate to patient size Laryngoscope Size: Mac and 4 Grade View: Grade I Tube type: Oral Tube size: 7.5 mm Airway Equipment and Method: Stylet Placement Confirmation: ETT inserted through vocal cords under direct vision,  positive ETCO2 and breath sounds checked- equal and bilateral Secured at: 22 cm Tube secured with: Tape Dental Injury: Teeth and Oropharynx as per pre-operative assessment

## 2017-07-02 NOTE — Interval H&P Note (Signed)
History and Physical Interval Note:  07/02/2017 6:56 AM  Mia Kline  has presented today for surgery, with the diagnosis of left knee osteoarthritis  The various methods of treatment have been discussed with the patient and family. After consideration of risks, benefits and other options for treatment, the patient has consented to  Procedure(s): LEFT TOTAL KNEE ARTHROPLASTY (Left) as a surgical intervention .  The patient's history has been reviewed, patient examined, no change in status, stable for surgery.  I have reviewed the patient's chart and labs.  Questions were answered to the patient's satisfaction.     Burnard BuntingG Scott Ronya [place]

## 2017-07-02 NOTE — Transfer of Care (Signed)
Immediate Anesthesia Transfer of Care Note  Patient: Mia Kline  Procedure(s) Performed: LEFT TOTAL KNEE ARTHROPLASTY (Left Knee)  Patient Location: PACU  Anesthesia Type:General  Level of Consciousness: awake, alert , oriented and patient cooperative  Airway & Oxygen Therapy: Patient Spontanous Breathing and Patient connected to nasal cannula oxygen  Post-op Assessment: Report given to RN, Post -op Vital signs reviewed and stable and Patient moving all extremities X 4  Post vital signs: Reviewed and stable  Last Vitals:  Vitals:   07/02/17 0630  BP: (!) 142/90  Pulse: 81  Resp: 20  Temp: 37 C  SpO2: 96%    Last Pain:  Vitals:   07/02/17 0630  TempSrc: Oral      Patients Stated Pain Goal: 3 (07/02/17 0641)  Complications: No apparent anesthesia complications

## 2017-07-02 NOTE — Anesthesia Procedure Notes (Addendum)
Anesthesia Regional Block: Adductor canal block   Pre-Anesthetic Checklist: ,, timeout performed, Correct Patient, Correct Site, Correct Laterality, Correct Procedure, Correct Position, site marked, Risks and benefits discussed, Surgical consent, Pre-op evaluation  Laterality: Left  Prep: chloraprep       Needles:  Injection technique: Single-shot  Needle Type: Stimulator Needle - 80          Additional Needles:   Procedures: Doppler guided,,,, ultrasound used (permanent image in chart),,,,  Narrative:  Start time: 07/02/2017 6:55 AM End time: 07/02/2017 7:15 AM Injection made incrementally with aspirations every 5 mL.  Performed by: Personally  Anesthesiologist: Dorris SinghGreen, Derian Dimalanta, MD

## 2017-07-02 NOTE — Progress Notes (Signed)
Orthopedic Tech Progress Note Patient Details:  Mia Kline 1955-11-15 [id number]  CPM Left Knee CPM Left Knee: On Left Knee Flexion (Degrees): 40 Left Knee Extension (Degrees): 0  Post Interventions Patient Tolerated: Well Instructions Provided: Care of device  Nikki DomCrawford, Andra Heslin 07/02/2017, 11:12 AM ohf not applied because new frames that will fit the new hospital beds have not yet arrived; RN notified

## 2017-07-02 NOTE — Anesthesia Procedure Notes (Signed)
Anesthesia Procedure Note     

## 2017-07-02 NOTE — Evaluation (Signed)
Physical Therapy Evaluation Patient Details Name: Mia Kline MRN: [id number] DOB: [birth year] Today's Date: 07/02/2017   History of Present Illness  61 y.o. female s/p L TKA 12/3. PMH includes: HTN, Complications of anesthesia, PONV. Anemia, Chest Pain.   Clinical Impression  Patient is s/p above surgery resulting in functional limitations due to the deficits listed below (see PT Problem List). PTA, patient was mod I with all mobility. Patient lives alone in 1 story home with stairs to enter, without support available. Upon eval patient presenting with post op  Pain and weakness and nausea and vomiting limiting her mobility greatly at this time. Pt is min-mod A level of assistance with transfers and gait at this time. Session focused on therex and transfer training. Pt states strong preference to return home with HHPT. Given current presentation and lack of support feel this will only be safe with great progress in tomorrows sessions. Will focus on improving independence and activity tolerance  tomorrow and continue to evaluate discharge dispo with patient who is agreeable to plan.  Patient will benefit from skilled PT to increase their independence and safety with mobility to allow discharge to the venue listed below.       Follow Up Recommendations DC plan and follow up therapy as arranged by surgeon;Supervision/Assistance - 24 hour    Equipment Recommendations  Rolling walker with 5" wheels    Recommendations for Other Services       Precautions / Restrictions Precautions Precautions: Knee;Fall Precaution Booklet Issued: Yes (comment) Precaution Comments: reviewed no pillow under knee, knee extension, and supnie therex Restrictions Weight Bearing Restrictions: Yes      Mobility  Bed Mobility Overal bed mobility: Needs Assistance Bed Mobility: Supine to Sit     Supine to sit: Supervision     General bed mobility comments: able to sit without physical assisatnce, heavy  reliance on UE support on bed rail  Transfers Overall transfer level: Needs assistance Equipment used: Rolling walker (2 wheeled) Transfers: Sit to/from [employer] CorporationStand;Stand Pivot Transfers Sit to Stand: Min assist Stand pivot transfers: Min assist;Mod assist;+2 safety/equipment       General transfer comment: Min A to power up into walker. cues for hand placement. Stand pivots into bed side commode   Ambulation/Gait Ambulation/Gait assistance: Min assist Ambulation Distance (Feet): 5 Feet Assistive device: Rolling walker (2 wheeled) Gait Pattern/deviations: Step-to pattern;Antalgic Gait velocity: decrased   General Gait Details: short ambulation in room into bed side chair and back into commode, back into chair. Patient very limited by nausea and vommiting at this time.   Stairs            Wheelchair Mobility    Modified Rankin (Stroke Patients Only)       Balance Overall balance assessment: Needs assistance Sitting-balance support: No upper extremity supported;Feet unsupported Sitting balance-Leahy Scale: Fair     Standing balance support: During functional activity;Bilateral upper extremity supported Standing balance-Leahy Scale: Poor Standing balance comment: reliant on UE support at this time. dizzy and nauseous when standing                              Pertinent Vitals/Pain Pain Assessment: Faces Faces Pain Scale: Hurts little more Pain Location: L knee Pain Descriptors / Indicators: Aching;Operative site guarding;Guarding;Grimacing Pain Intervention(s): Limited activity within patient's tolerance;Monitored during session;Premedicated before session    Home Living Family/patient expects to be discharged to:: Private residence Living Arrangements: Alone Available Help at Discharge: (none)  Type of Home: House Home Access: Stairs to enter Entrance Stairs-Rails: Can reach both Entrance Stairs-Number of Steps: 4 Home Layout: One level Home  Equipment: None      Prior Function Level of Independence: Independent               Hand Dominance        Extremity/Trunk Assessment   Upper Extremity Assessment Upper Extremity Assessment: Defer to OT evaluation    Lower Extremity Assessment Lower Extremity Assessment: (RLE: 3+/5 gross, LLE 3-/5 gross. )       Communication   Communication: No difficulties  Cognition Arousal/Alertness: Awake/alert Behavior During Therapy: WFL for tasks assessed/performed;Flat affect                                          General Comments General comments (skin integrity, edema, etc.): Patient with nausea and emesis x3 during session. Unable to tolerate OOB mobility currently. SpO2=>90% on RA.     Exercises Total Joint Exercises Goniometric ROM: 85 General Exercises - Lower Extremity Ankle Circles/Pumps: AROM;Both;20 reps Quad Sets: AROM;Both;10 reps Long Arc Quad: AROM;Both;10 reps Heel Slides: AROM;Left;10 reps Hip ABduction/ADduction: AROM;Both;10 reps   Assessment/Plan    PT Assessment Patient needs continued PT services  PT Problem List Decreased strength;Decreased range of motion;Decreased balance;Decreased activity tolerance;Decreased mobility;Decreased knowledge of use of DME;Decreased safety awareness;Pain;Obesity       PT Treatment Interventions DME instruction;Gait training;Stair training;Functional mobility training;Therapeutic activities;Therapeutic exercise    PT Goals (Current goals can be found in the Care Plan section)  Acute Rehab PT Goals Patient Stated Goal: To return home with HHPT PT Goal Formulation: With patient Time For Goal Achievement: 07/09/17 Potential to Achieve Goals: Fair    Frequency 7X/week   Barriers to discharge Decreased caregiver support lives alone. stairs to enter.     Co-evaluation               AM-PAC PT "6 Clicks" Daily Activity  Outcome Measure Difficulty turning over in bed (including  adjusting bedclothes, sheets and blankets)?: A Lot Difficulty moving from lying on back to sitting on the side of the bed? : A Lot Difficulty sitting down on and standing up from a chair with arms (e.g., wheelchair, bedside commode, etc,.)?: A Lot Help needed moving to and from a bed to chair (including a wheelchair)?: A Little Help needed walking in hospital room?: A Lot Help needed climbing 3-5 steps with a railing? : A Lot 6 Click Score: 13    End of Session Equipment Utilized During Treatment: Gait belt;Oxygen Activity Tolerance: Treatment limited secondary to medical complications (Comment)(Nasuea/Vommiting ) Patient left: in chair;with call bell/phone within reach Nurse Communication: Mobility status PT Visit Diagnosis: Unsteadiness on feet (R26.81);Other abnormalities of gait and mobility (R26.89);Muscle weakness (generalized) (M62.81);Pain Pain - Right/Left: Left Pain - part of body: Knee    Time: 1800-1855(extenisve time spent nausea/tolieting with nursing. ) PT Time Calculation (min) (ACUTE ONLY): 55 min   Charges:   PT Evaluation $PT Eval Low Complexity: 1 Low PT Treatments $Therapeutic Exercise: 8-22 mins $Therapeutic Activity: 8-22 mins   PT G Codes:       Etta GrandchildSean Orvilla Truett, PT, DPT Acute Rehab Services Pager: [phone]   Etta GrandchildSean  Elloise Roark 07/02/2017, 7:23 PM

## 2017-07-02 NOTE — Brief Op Note (Signed)
07/02/2017  10:20 AM  PATIENT:  Mia Kline  61 y.o. female  PRE-OPERATIVE DIAGNOSIS:  left knee osteoarthritis  POST-OPERATIVE DIAGNOSIS:  left knee osteoarthritis  PROCEDURE:  Procedure(s): LEFT TOTAL KNEE ARTHROPLASTY  SURGEON:  Surgeon(s): Cammy Copaean, Gregory Scott, MD  ASSISTANT: J queen rnfa  ANESTHESIA:   general  EBL: 75 ml    Total I/O In: 1000 [I.V.:1000] Out: 75 [Blood:75]  BLOOD ADMINISTERED: none  DRAINS: none   LOCAL MEDICATIONS USED:  exparel marcaine mso4 clonidine  SPECIMEN:  No Specimen  COUNTS:  YES  TOURNIQUET:   Total Tourniquet Time Documented: Thigh (Left) - 79 minutes Total: Thigh (Left) - 79 minutes   DICTATION: .Other Dictation: Dictation Number [id number]  PLAN OF CARE: Admit to inpatient   PATIENT DISPOSITION:  PACU - hemodynamically stable

## 2017-07-03 ENCOUNTER — Encounter (HOSPITAL_COMMUNITY): Payer: Self-pay | Admitting: Orthopedic Surgery

## 2017-07-03 NOTE — Plan of Care (Signed)
  Progressing Education: Knowledge of General Education information will improve 07/03/2017 1230 - Progressing by Darreld Mcleanox, Orlander Norwood, RN Health Behavior/Discharge Planning: Ability to manage health-related needs will improve 07/03/2017 1230 - Progressing by Darreld Mcleanox, Shawnte Winton, RN Clinical Measurements: Ability to maintain clinical measurements within normal limits will improve 07/03/2017 1230 - Progressing by Darreld Mcleanox, Jw Covin, RN Will remain free from infection 07/03/2017 1230 - Progressing by Darreld Mcleanox, Latia Mataya, RN Diagnostic test results will improve 07/03/2017 1230 - Progressing by Darreld Mcleanox, Jakorian Marengo, RN Respiratory complications will improve 07/03/2017 1230 - Progressing by Darreld Mcleanox, Jasslyn Finkel, RN Cardiovascular complication will be avoided 07/03/2017 1230 - Progressing by Darreld Mcleanox, Adore Kithcart, RN Activity: Risk for activity intolerance will decrease 07/03/2017 1230 - Progressing by Darreld Mcleanox, Tareva Leske, RN Nutrition: Adequate nutrition will be maintained 07/03/2017 1230 - Progressing by Darreld Mcleanox, Frieda Arnall, RN Coping: Level of anxiety will decrease 07/03/2017 1230 - Progressing by Darreld Mcleanox, Kolby Schara, RN Elimination: Will not experience complications related to bowel motility 07/03/2017 1230 - Progressing by Darreld Mcleanox, Brees Hounshell, RN Will not experience complications related to urinary retention 07/03/2017 1230 - Progressing by Darreld Mcleanox, Marlaya Turck, RN Pain Managment: General experience of comfort will improve 07/03/2017 1230 - Progressing by Darreld Mcleanox, Brie Eppard, RN Safety: Ability to remain free from injury will improve 07/03/2017 1230 - Progressing by Darreld Mcleanox, Ardith Lewman, RN Skin Integrity: Risk for impaired skin integrity will decrease 07/03/2017 1230 - Progressing by Darreld Mcleanox, Rakeya Glab, RN Education: Knowledge of the prescribed therapeutic regimen will improve 07/03/2017 1230 - Progressing by Darreld Mcleanox, Gertrue Willette, RN Activity: Ability to avoid complications of mobility impairment will improve 07/03/2017 1230 - Progressing by Darreld Mcleanox, Roschelle Calandra, RN Range of joint motion will improve 07/03/2017 1230 - Progressing by Darreld Mcleanox, Rosevelt Luu, RN Clinical  Measurements: Postoperative complications will be avoided or minimized 07/03/2017 1230 - Progressing by Darreld Mcleanox, Deborrah Mabin, RN Pain Management: Pain level will decrease with appropriate interventions 07/03/2017 1230 - Progressing by Darreld Mcleanox, Sohan Potvin, RN Skin Integrity: Signs of wound healing will improve 07/03/2017 1230 - Progressing by Darreld Mcleanox, Arty Lantzy, RN

## 2017-07-03 NOTE — Progress Notes (Signed)
Subjective: Patient stable.  Pain controlled.   Objective: Vital signs in last 24 hours: Temp:  [97.5 F (36.4 C)-98.8 F (37.1 C)] 98.8 F (37.1 C) (12/04 0344) Pulse Rate:  [86-100] 100 (12/04 0344) Resp:  [15-16] 16 (12/04 0344) BP: (126-140)/(64-77) 133/65 (12/04 0344) SpO2:  [94 %-100 %] 94 % (12/04 0344)  Intake/Output from previous day: 12/03 0701 - 12/04 0700 In: 3620 [P.O.:1210; I.V.:2200; IV Piggyback:210] Out: 75 [Blood:75] Intake/Output this shift: Total I/O In: 240 [P.O.:240] Out: -   Exam:  Intact pulses distally Dorsiflexion/Plantar flexion intact  Labs: No results for input(s): HGB in the last 72 hours. No results for input(s): WBC, RBC, HCT, PLT in the last 72 hours. No results for input(s): NA, K, CL, CO2, BUN, CREATININE, GLUCOSE, CALCIUM in the last 72 hours. No results for input(s): LABPT, INR in the last 72 hours.  Assessment/Plan: Plan is for CPM and physical therapy.  Possible discharge tomorrow if she is making progress.  Encouraged her to spend as much time as possible on the CPM machine   G Dorene GrebeScott Sindi Beckworth 07/03/2017, 12:29 PM

## 2017-07-03 NOTE — Anesthesia Postprocedure Evaluation (Signed)
Anesthesia Post Note  Patient: Mia JudeDeborah Kline  Procedure(s) Performed: LEFT TOTAL KNEE ARTHROPLASTY (Left Knee)     Patient location during evaluation: PACU Anesthesia Type: General Level of consciousness: sedated and patient cooperative Pain management: pain level controlled Vital Signs Assessment: post-procedure vital signs reviewed and stable Respiratory status: spontaneous breathing Cardiovascular status: stable Anesthetic complications: no    Last Vitals:  Vitals:   07/03/17 0002 07/03/17 0344  BP: 126/64 133/65  Pulse: 100 100  Resp: 15 16  Temp: 36.7 C 37.1 C  SpO2: 96% 94%    Last Pain:  Vitals:   07/03/17 0655  TempSrc:   PainSc: 7                  Lewie LoronJohn Jourdyn Ferrin

## 2017-07-03 NOTE — Progress Notes (Signed)
Physical Therapy Treatment Patient Details Name: Mia Kline MRN: [id number] DOB: Apr 27, 1956 Today's Date: 07/03/2017    History of Present Illness 61 y.o. female s/p L TKA 12/3. PMH includes: HTN, Complications of anesthesia, PONV. Anemia, Chest Pain.     PT Comments    Patient continuing to progress well towards goals. PM session focused extensively on stair training. Patient able to now ambulate outside of room, and ascend/descened stairs with min guard. Friend/caregiver a great asset for patient and has picked up guarding and assistance very well throughout last two sessions. Due to her level of support now, feel patient will be abel to safely return home when medically cleared for d/c at this point, but will continue to progress independence and maximize safety in subsequent session until then. Patient left in CPM machine.   Follow Up Recommendations  Home health PT;DC plan and follow up therapy as arranged by surgeon;Supervision/Assistance - 24 hour     Equipment Recommendations  Rolling walker with 5" wheels    Recommendations for Other Services       Precautions / Restrictions Precautions Precautions: Knee;Fall Precaution Booklet Issued: Yes (comment) Precaution Comments: reviewed no pillow under knee, knee extension, and supnie therex Restrictions Weight Bearing Restrictions: Yes LLE Weight Bearing: Weight bearing as tolerated    Mobility  Bed Mobility Overal bed mobility: Modified Independent Bed Mobility: Supine to Sit     Supine to sit: Modified independent (Device/Increase time)     General bed mobility comments: slow and painful, able to rise to EOB without physical asisstance  Transfers Overall transfer level: Needs assistance Equipment used: Rolling walker (2 wheeled) Transfers: Sit to/from [employer]Stand;Stand Pivot Transfers Sit to Stand: Min assist Stand pivot transfers: Min guard       General transfer comment: Min A to help power up and descend. Min  Guard for stand pivoting. improving quality, cues for reinforcment of safety and seuencing.   Ambulation/Gait Ambulation/Gait assistance: Min guard Ambulation Distance (Feet): 60 Feet Assistive device: Rolling walker (2 wheeled) Gait Pattern/deviations: Step-to pattern;Antalgic Gait velocity: decreased   General Gait Details: incrased ambulation with min guard. step to gait despite cues at this time.   Stairs Stairs: Yes   Stair Management: Two rails Number of Stairs: 10 General stair comments: min guard with stairs, cues for sequencing. education with caregiver for proper guarding and techiqnue. Patient demonstrating proper ambulation but is not safe to do without supervision/min guard at this time.  Wheelchair Mobility    Modified Rankin (Stroke Patients Only)       Balance Overall balance assessment: Needs assistance Sitting-balance support: No upper extremity supported;Feet unsupported Sitting balance-Leahy Scale: Fair     Standing balance support: During functional activity;Bilateral upper extremity supported Standing balance-Leahy Scale: Fair                              Cognition Arousal/Alertness: Awake/alert Behavior During Therapy: WFL for tasks assessed/performed;Flat affect Overall Cognitive Status: Within Functional Limits for tasks assessed                                        Exercises      General Comments        Pertinent Vitals/Pain Pain Assessment: Faces Faces Pain Scale: Hurts even more Pain Location: L knee Pain Descriptors / Indicators: Aching;Operative site guarding;Guarding;Grimacing Pain Intervention(s): Limited activity within  patient's tolerance;Monitored during session    Home Living                      Prior Function            PT Goals (current goals can now be found in the care plan section) Acute Rehab PT Goals Patient Stated Goal: To return home with HHPT PT Goal Formulation:  With patient Time For Goal Achievement: 07/09/17 Potential to Achieve Goals: Good Progress towards PT goals: Progressing toward goals    Frequency    7X/week      PT Plan Current plan remains appropriate    Co-evaluation              AM-PAC PT "6 Clicks" Daily Activity  Outcome Measure  Difficulty turning over in bed (including adjusting bedclothes, sheets and blankets)?: A Lot Difficulty moving from lying on back to sitting on the side of the bed? : A Lot Difficulty sitting down on and standing up from a chair with arms (e.g., wheelchair, bedside commode, etc,.)?: Unable Help needed moving to and from a bed to chair (including a wheelchair)?: A Little Help needed walking in hospital room?: A Little Help needed climbing 3-5 steps with a railing? : A Little 6 Click Score: 14    End of Session Equipment Utilized During Treatment: Gait belt Activity Tolerance: Patient limited by pain;Patient limited by fatigue Patient left: in bed;in CPM;with call bell/phone within reach;with family/visitor present Nurse Communication: Mobility status PT Visit Diagnosis: Unsteadiness on feet (R26.81);Other abnormalities of gait and mobility (R26.89);Muscle weakness (generalized) (M62.81);Pain Pain - Right/Left: Left Pain - part of body: Knee     Time: [id number] PT Time Calculation (min) (ACUTE ONLY): 53 min  Charges:  $Gait Training: 23-37 mins $Therapeutic Activity: 8-22 mins                    G Codes:       Etta GrandchildSean Adri Schloss, PT, DPT Acute Rehab Services Pager: [phone]    Etta GrandchildSean  Dayanara Sherrill 07/03/2017, 4:10 PM

## 2017-07-03 NOTE — Progress Notes (Signed)
Physical Therapy Treatment Patient Details Name: Mia Kline MRN: [id number] DOB: Aug 09, 1955 Today's Date: 07/03/2017    History of Present Illness 61 y.o. female s/p L TKA 12/3. PMH includes: HTN, Complications of anesthesia, PONV. Anemia, Chest Pain.     PT Comments    Patient progressing towards functional goals at this time. Session focused on improving quality of transfers, reinforcing exercises, and discussing safety considerations with caregiver. Patients friend now reporting she will stay with patient at home for several weeks, which I believe is a great idea and think patient will benefit greatly  from having 24 hour support. Next session will focus on introducing stair training and improving activity tolerance.    Follow Up Recommendations  Home health PT;DC plan and follow up therapy as arranged by surgeon;Supervision/Assistance - 24 hour     Equipment Recommendations  Rolling walker with 5" wheels    Recommendations for Other Services       Precautions / Restrictions Precautions Precautions: Knee;Fall Precaution Booklet Issued: Yes (comment) Precaution Comments: reviewed no pillow under knee, knee extension, and supnie therex Restrictions Weight Bearing Restrictions: Yes LLE Weight Bearing: Weight bearing as tolerated    Mobility  Bed Mobility Overal bed mobility: Modified Independent             General bed mobility comments: slow and painful, able to rise to EOB without physical asisstance  Transfers Overall transfer level: Needs assistance Equipment used: Rolling walker (2 wheeled) Transfers: Sit to/from UGI CorporationStand;Stand Pivot Transfers Sit to Stand: Min assist Stand pivot transfers: Min guard       General transfer comment: Min A to help power up and descend. Min Guard for stand pivoting.   Ambulation/Gait Ambulation/Gait assistance: Min guard Ambulation Distance (Feet): 10 Feet Assistive device: Rolling walker (2 wheeled) Gait  Pattern/deviations: Step-to pattern;Antalgic Gait velocity: decreased   General Gait Details: short ambulation to bathroom and to bedside chair. improvment in level of assistance needed.    Stairs            Wheelchair Mobility    Modified Rankin (Stroke Patients Only)       Balance Overall balance assessment: Needs assistance Sitting-balance support: No upper extremity supported;Feet unsupported Sitting balance-Leahy Scale: Fair     Standing balance support: During functional activity;Bilateral upper extremity supported Standing balance-Leahy Scale: Fair Standing balance comment: tolerated standing at sink to wash hands without UE support. requuires UE support for dynamic balance safer with use of RW.                            Cognition Arousal/Alertness: Awake/alert Behavior During Therapy: WFL for tasks assessed/performed;Flat affect Overall Cognitive Status: Within Functional Limits for tasks assessed                                        Exercises Total Joint Exercises Knee Flexion: AROM;Both;10 reps General Exercises - Lower Extremity Ankle Circles/Pumps: AROM;Both;20 reps Quad Sets: AROM;Both;10 reps Long Arc Quad: AROM;Both;10 reps Heel Slides: AROM;Left;10 reps Hip ABduction/ADduction: AROM;Both;10 reps    General Comments General comments (skin integrity, edema, etc.): Extensive conversation with caregiver for home safety, exercise recomendations, course of rehab and tissue healing times. Educated patient on incentive spirometer and breathing techinques as SpO2=88%-95% on RA with activity.       Pertinent Vitals/Pain Pain Assessment: Faces Faces Pain Scale: Hurts even more  Pain Location: L knee Pain Descriptors / Indicators: Aching;Operative site guarding;Guarding;Grimacing Pain Intervention(s): Limited activity within patient's tolerance;Premedicated before session;Monitored during session    Home Living                       Prior Function            PT Goals (current goals can now be found in the care plan section) Acute Rehab PT Goals Patient Stated Goal: To return home with HHPT PT Goal Formulation: With patient Time For Goal Achievement: 07/09/17 Potential to Achieve Goals: Fair Progress towards PT goals: Progressing toward goals    Frequency    7X/week      PT Plan Current plan remains appropriate    Co-evaluation              AM-PAC PT "6 Clicks" Daily Activity  Outcome Measure  Difficulty turning over in bed (including adjusting bedclothes, sheets and blankets)?: A Lot Difficulty moving from lying on back to sitting on the side of the bed? : A Lot Difficulty sitting down on and standing up from a chair with arms (e.g., wheelchair, bedside commode, etc,.)?: Unable Help needed moving to and from a bed to chair (including a wheelchair)?: A Little Help needed walking in hospital room?: A Little Help needed climbing 3-5 steps with a railing? : A Lot 6 Click Score: 13    End of Session Equipment Utilized During Treatment: Gait belt Activity Tolerance: Patient limited by pain;Patient limited by fatigue Patient left: in chair;with call bell/phone within reach;with family/visitor present Nurse Communication: Mobility status PT Visit Diagnosis: Unsteadiness on feet (R26.81);Other abnormalities of gait and mobility (R26.89);Muscle weakness (generalized) (M62.81);Pain Pain - Right/Left: Left Pain - part of body: Knee     Time: 1030-1120 PT Time Calculation (min) (ACUTE ONLY): 50 min  Charges:  $Therapeutic Exercise: 8-22 mins $Therapeutic Activity: 8-22 mins $Self Care/Home Management: 8-22                    G Codes:       Mia Kline, PT, DPT Acute Rehab Services Pager: [phone]     Mia GrandchildSean  Mia Kline 07/03/2017, 11:45 AM

## 2017-07-03 NOTE — Op Note (Signed)
[name], [name]              ACCOUNT NO.:  [id number]  MEDICAL RECORD NO.:  [id number]  LOCATION:                                 FACILITY:  PHYSICIAN:  Burnard BuntingG. Scott Giani Winther, M.D.         DATE OF BIRTH:  DATE OF PROCEDURE:  07/02/2017 DATE OF DISCHARGE:                              OPERATIVE REPORT   PREOPERATIVE DIAGNOSIS:  Left knee arthritis.  POSTOPERATIVE DIAGNOSIS:  Left knee arthritis.  PROCEDURE:  Left total knee replacement.  SURGEON:  Burnard BuntingG. Scott Macaria Bias, MD.  ASSISTANT:  Hart CarwinJustin Queen, RNFA.  INDICATIONS:  Mia Kline is a 61 year old patient with end stage left knee arthritis, who presents for operative management after explanation of risks and benefits.  PROCEDURE IN DETAIL:  The patient was brought to the operating room where general endotracheal anesthesia was induced, preop antibiotics administered, time-out was called.  Left leg was prescrubbed with alcohol and Betadine, allowed to air dry, prepped with DuraPrep solution and draped in a sterile manner.  Collier Flowersoban was used to cover the operative field.  The time-out was called.  Leg was elevated and exsanguinated with the Esmarch wrap.  Tourniquet was inflated.  Total tourniquet time was 78 minutes at 300 mmHg.  Anterior approach to knee was made.  Skin and subcutaneous tissue were sharply divided.  The median parapatellar approach to the knee was made and marked with a #1 Vicryl suture. Significant arthritis was present involving the entire patellofemoral compartment and extending down into both condyles.  Patellofemoral arthroplasty not an option in this case.  At this time, decision was made to proceed with total knee replacement.  The patella was everted. Osteophytes were removed.  Minimal soft tissue mobilization was performed on the medial side.  ACL was released, but the PCL was preserved.  Intramedullary alignment was then used to make a cut off the tibial plateau perpendicular to the mechanical axis of 9 mm based on  the least affected lateral tibial plateau.  Collaterals were protected and posterior neurovascular structures were protected in addition to the PCL.  Tibial plateau piece was removed.  At this time, attention was directed towards the femur.  An 8-mm cut was made in 5 degrees valgus off the distal femur.  The femur sized to a size 4.  Anterior, posterior, and chamber cuts were made.  Again, the PCL was protected. The tibia was then keel punched and prepared for the press-fit prosthesis because bone quality was good.  Patella was then prepared by cutting down from 22 to 12.  A 32 patella trial was then placed.  At this time, trial components were placed.  The patient had full extension and full flexion, excellent patellar tracking utilizing 11-mm polyethylene insert.  Trial components were removed at this time. Thorough irrigation performed with 3 liters of irrigating solution. Exparel, morphine, and saline were used to inject the capsule. Tranexamic acid sponge allowed to sit in the incision for 3 minutes after thorough irrigation.  Following this, the components were tapped into position with excellent press-fit obtained.  The patella was press- fit as well.  An 11-mm deep dish cruciate retaining poly was then placed.  Same stability parameters  were maintained.  Thorough irrigation was performed again and the tourniquet was released.  The arthrotomy was closed using #1 Vicryl suture followed by interrupted inverted 0 Vicryl suture, 2-0 Vicryl suture, and 3-0 Monocryl.  Steri-Strips and an Aquacel dressing were placed.  Bulky dressing and knee immobilizer were then placed.  The patient tolerated the procedure well without immediate complications.  Skin edges were anesthetized with plain Marcaine, morphine, and clonidine prior to full closure.  The knee immobilizer was placed.  The patient tolerated the procedure well without immediate complication.  Transferred to the recovery room in  stable condition.     Burnard BuntingG. Scott Lynlee Stratton, M.D.   ______________________________ Reece AgarG. Dorene GrebeScott Coltyn Hanning, M.D.    GSD/MEDQ  D:  07/02/2017  T:  07/03/2017  Job:  [id number]

## 2017-07-04 ENCOUNTER — Other Ambulatory Visit (INDEPENDENT_AMBULATORY_CARE_PROVIDER_SITE_OTHER): Payer: Self-pay

## 2017-07-04 MED ORDER — RIVAROXABAN 10 MG PO TABS: 10.0000 mg | ORAL_TABLET | Freq: Every day | ORAL | 0 refills | Status: DC

## 2017-07-04 MED ORDER — METHOCARBAMOL 500 MG PO TABS: 500.0000 mg | ORAL_TABLET | Freq: Four times a day (QID) | ORAL | 0 refills | Status: DC | PRN

## 2017-07-04 MED ORDER — HYDROMORPHONE HCL 2 MG PO TABS: 2.0000 mg | ORAL_TABLET | ORAL | 0 refills | Status: DC | PRN

## 2017-07-04 NOTE — Progress Notes (Signed)
Physical Therapy Treatment and Discharge  Patient Details Name: Mia Kline MRN: [id number] DOB: 11/29/1955 Today's Date: 07/04/2017    History of Present Illness 61 y.o. female s/p L TKA 12/3. PMH includes: HTN, Complications of anesthesia, PONV. Anemia, Chest Pain.     PT Comments    Session focused on reinforcing stairs, improving gait, and educating caregiver on safety for home d/c today. Patient has progressed very well in therapy over last several visits and is safe to return home with 24 hours support at this time once medically cleared for d/c. Pt and caregiver educated on safety considerations for home and have no further questions or concerns at this time. Pt has met all functional goals and will benefit from skilled home health PT when medically cleared for d/c.     Follow Up Recommendations  Home health PT;DC plan and follow up therapy as arranged by surgeon;Supervision/Assistance - 24 hour     Equipment Recommendations  Rolling walker with 5" wheels    Recommendations for Other Services       Precautions / Restrictions Precautions Precaution Comments: reviewed no pillow under knee, knee extension, and supnie therex Restrictions Weight Bearing Restrictions: Yes LLE Weight Bearing: Weight bearing as tolerated    Mobility  Bed Mobility Overal bed mobility: Modified Independent             General bed mobility comments: pt using R LE to hook L LE to swing to EOB  Transfers Overall transfer level: Needs assistance Equipment used: Rolling walker (2 wheeled) Transfers: Sit to/from Stand Sit to Stand: Min guard Stand pivot transfers: Min guard       General transfer comment: [employer] with power up, caregiver comfortable with guarding.   Ambulation/Gait Ambulation/Gait assistance: Min guard;Supervision Ambulation Distance (Feet): 75 Feet Assistive device: Rolling walker (2 wheeled) Gait Pattern/deviations: Step-to pattern;Antalgic Gait velocity:  decreased   General Gait Details: min guard progressing to supervision today with improved mechanics. caregiver comforatble with guarding. Patient grimacing and exerting less.    Stairs Stairs: Yes   Stair Management: Two rails;No rails;With walker Number of Stairs: 12 General stair comments: practiced stairs again with 2 rails for back entrance, now min guard supervision as well as platform stairs for front entrance with 1.5 shallow steps to landing. Discussed setup with caregiver who feels comfortable guarding patient today for safe return home. patient noticablly more stable today than prior session.  Wheelchair Mobility    Modified Rankin (Stroke Patients Only)       Balance Overall balance assessment: Needs assistance Sitting-balance support: No upper extremity supported;Feet unsupported Sitting balance-Leahy Scale: Good     Standing balance support: During functional activity;Bilateral upper extremity supported Standing balance-Leahy Scale: Fair Standing balance comment: patient can balalnce static without UE but safer with support for dynamic tasks at this time.                            Cognition Arousal/Alertness: Awake/alert Behavior During Therapy: WFL for tasks assessed/performed;Flat affect Overall Cognitive Status: Within Functional Limits for tasks assessed                                        Exercises      General Comments General comments (skin integrity, edema, etc.): VSS throughout session. Caregiver educated on Economist with assitance, discussed with patient preventive measures for  shoudler strain with heavy use of RW in the days to come.       Pertinent Vitals/Pain Pain Assessment: Faces Faces Pain Scale: Hurts even more Pain Location: L knee Pain Descriptors / Indicators: Aching;Operative site guarding;Guarding;Grimacing Pain Intervention(s): Limited activity within patient's tolerance;Monitored during  session;Premedicated before session    Home Living Family/patient expects to be discharged to:: Private residence Living Arrangements: Alone   Type of Home: House Home Access: Stairs to enter Entrance Stairs-Rails: Can reach both Home Layout: One level Home Equipment: None      Prior Function Level of Independence: Independent          PT Goals (current goals can now be found in the care plan section) Acute Rehab PT Goals Patient Stated Goal: to return home PT Goal Formulation: With patient Time For Goal Achievement: 07/09/17 Potential to Achieve Goals: Good Progress towards PT goals: Goals met/education completed, patient discharged from PT    Frequency           PT Plan Current plan remains appropriate    Co-evaluation              AM-PAC PT "6 Clicks" Daily Activity  Outcome Measure  Difficulty turning over in bed (including adjusting bedclothes, sheets and blankets)?: A Little Difficulty moving from lying on back to sitting on the side of the bed? : A Little Difficulty sitting down on and standing up from a chair with arms (e.g., wheelchair, bedside commode, etc,.)?: A Lot Help needed moving to and from a bed to chair (including a wheelchair)?: A Little Help needed walking in hospital room?: A Little Help needed climbing 3-5 steps with a railing? : A Little 6 Click Score: 17    End of Session Equipment Utilized During Treatment: Gait belt Activity Tolerance: Patient limited by pain;Patient limited by fatigue Patient left: in bed;with call bell/phone within reach;with family/visitor present;in CPM   PT Visit Diagnosis: Unsteadiness on feet (R26.81);Other abnormalities of gait and mobility (R26.89);Muscle weakness (generalized) (M62.81);Pain Pain - Right/Left: Left Pain - part of body: Knee     Time: 1010-1040 PT Time Calculation (min) (ACUTE ONLY): 30 min  Charges:  $Gait Training: 8-22 mins                    G Codes:      Reinaldo Berber, PT,  DPT Acute Rehab Services Pager: [phone]    Reinaldo Berber 07/04/2017, 10:46 AM

## 2017-07-04 NOTE — Progress Notes (Signed)
Subjective: Pt stable - stairs ok   Objective: Vital signs in last 24 hours: Temp:  [98.8 F (37.1 C)-99.3 F (37.4 C)] 98.8 F (37.1 C) (12/05 0602) Pulse Rate:  [98-100] 99 (12/05 0602) Resp:  [15-16] 16 (12/05 0602) BP: (134-142)/(67-71) 134/67 (12/05 0602) SpO2:  [91 %-95 %] 91 % (12/05 0602)  Intake/Output from previous day: 12/04 0701 - 12/05 0700 In: 1560 [P.O.:1560] Out: 0  Intake/Output this shift: No intake/output data recorded.  Exam:  Intact pulses distally Dorsiflexion/Plantar flexion intact  Labs: No results for input(s): HGB in the last 72 hours. No results for input(s): WBC, RBC, HCT, PLT in the last 72 hours. No results for input(s): NA, K, CL, CO2, BUN, CREATININE, GLUCOSE, CALCIUM in the last 72 hours. No results for input(s): LABPT, INR in the last 72 hours.  Assessment/Plan: Plan dc this afternoon after pt and cpm   [employer] Scott Dean 07/04/2017, 8:21 AM

## 2017-07-04 NOTE — Evaluation (Signed)
Occupational Therapy Evaluation Patient Details Name: Mia Kline MRN: [id number] DOB: Dec 23, 1955 Today's Date: 07/04/2017    History of Present Illness 61 y.o. female s/p L TKA 12/3. PMH includes: HTN, Complications of anesthesia, PONV. Anemia, Chest Pain.    Clinical Impression   Patient evaluated by Occupational Therapy with no further acute OT needs identified. All education has been completed and the patient has no further questions. See below for any follow-up Occupational Therapy or equipment needs. OT to sign off. Thank you for referral.      Follow Up Recommendations  No OT follow up    Equipment Recommendations  None recommended by OT    Recommendations for Other Services       Precautions / Restrictions Precautions Precaution Comments: reviewed no pillow under knee, knee extension, and supnie therex      Mobility Bed Mobility Overal bed mobility: Modified Independent             General bed mobility comments: pt using R LE to hook L LE to swing to EOB  Transfers Overall transfer level: Needs assistance Equipment used: Rolling walker (2 wheeled) Transfers: Sit to/from Stand Sit to Stand: Min guard              Balance                                           ADL either performed or assessed with clinical judgement   ADL Overall ADL's : Needs assistance/impaired Eating/Feeding: Independent   Grooming: Wash/dry hands;Wash/dry face;Oral care;Min guard   Upper Body Bathing: Modified independent   Lower Body Bathing: Min guard   Upper Body Dressing : Supervision/safety   Lower Body Dressing: Minimal assistance Lower Body Dressing Details (indicate cue type and reason): needed (A) to thread pants over socks but able to complete underwear without (A) Toilet Transfer: Min Personnel officerguard;RW         Tub/Shower Transfer Details (indicate cue type and reason): educated on shower bench transfer iwth friend Mia Kline prsent Functional  mobility during ADLs: Min guard;Rolling walker General ADL Comments: Pt completed dressing for d/c sink level grooming and basic transfer this session  Educated to use clean linen each shower to decr risk of infection   Vision   Vision Assessment?: No apparent visual deficits     Perception     Praxis      Pertinent Vitals/Pain Pain Assessment: Faces Faces Pain Scale: Hurts even more Pain Location: L knee Pain Descriptors / Indicators: Aching;Operative site guarding;Guarding;Grimacing Pain Intervention(s): Monitored during session;Premedicated before session;Repositioned;Ice applied     Hand Dominance Right   Extremity/Trunk Assessment Upper Extremity Assessment Upper Extremity Assessment: Overall WFL for tasks assessed   Lower Extremity Assessment Lower Extremity Assessment: Defer to PT evaluation   Cervical / Trunk Assessment Cervical / Trunk Assessment: Normal   Communication Communication Communication: No difficulties   Cognition Arousal/Alertness: Awake/alert Behavior During Therapy: WFL for tasks assessed/performed;Flat affect Overall Cognitive Status: Within Functional Limits for tasks assessed                                     General Comments  dressing dry and intact. Educated on showering and keeping wound clean    Exercises     Shoulder Instructions      Home Living Family/patient  expects to be discharged to:: Private residence Living Arrangements: Alone   Type of Home: House Home Access: Stairs to enter Secretary/administratorntrance Stairs-Number of Steps: 4 Entrance Stairs-Rails: Can reach both Home Layout: One level     Bathroom Shower/Tub: Tub only   FirefighterBathroom Toilet: Standard Bathroom Accessibility: Yes   Home Equipment: None          Prior Functioning/Environment Level of Independence: Independent                 OT Problem List:        OT Treatment/Interventions:      OT Goals(Current goals can be found in the care plan  section) Acute Rehab OT Goals Patient Stated Goal: to return home  OT Frequency:     Barriers to D/C:            Co-evaluation              AM-PAC PT "6 Clicks" Daily Activity     Outcome Measure Help from another person eating meals?: None Help from another person taking care of personal grooming?: None Help from another person toileting, which includes using toliet, bedpan, or urinal?: None Help from another person bathing (including washing, rinsing, drying)?: None Help from another person to put on and taking off regular upper body clothing?: None Help from another person to put on and taking off regular lower body clothing?: A Little 6 Click Score: 23   End of Session Equipment Utilized During Treatment: Gait belt;Rolling walker CPM Left Knee CPM Left Knee: Off Nurse Communication: Mobility status;Precautions  Activity Tolerance: Patient tolerated treatment well Patient left: in chair;with call bell/phone within reach;with family/visitor present  OT Visit Diagnosis: Unsteadiness on feet (R26.81)                Time: [id number] OT Time Calculation (min): 22 min Charges:  OT General Charges $OT Visit: 1 Visit OT Evaluation $OT Eval Moderate Complexity: 1 Mod G-Codes:      Mateo FlowJones, [name]   OTR/L Pager: [phone] Office: [phone] .   [name], [name] 07/04/2017, 10:05 AM

## 2017-07-04 NOTE — Discharge Summary (Signed)
Physician Discharge Summary  Patient ID: Mia Kline MRN: [id number] DOB/AGE: 09/06/1955 61 y.o.  Admit date: 07/02/2017 Discharge date: 07/04/2017  Admission Diagnoses:  Active Problems:   Arthritis of knee   Discharge Diagnoses:  Same  Surgeries: Procedure(s): LEFT TOTAL KNEE ARTHROPLASTY on 07/02/2017   Consultants:   Discharged Condition: Stable  Hospital Course: Mia JudeDeborah Alessio is a 61 year old female with chief complaint of left leg pain., and found to have a diagnosis of left knee arthritis.  They were brought to the operating room on 07/02/2017 and underwent the above named procedures.  She tolerated the procedure well and was mobilizing well with physical therapy by the time of discharge.  She was started on Xarelto for DVT prophylaxis and was taking Dilaudid for pain.  She is discharged home in good condition and will follow up with me in 10 days.  Continue with home health physical therapy and CPM machine use at home.  Antibiotics given:  Anti-infectives (From admission, onward)   Start     Dose/Rate Route Frequency Ordered Stop   07/02/17 0700  ceFAZolin (ANCEF) IVPB 2g/100 mL premix     2 g 200 mL/hr over 30 Minutes Intravenous To ShortStay Surgical 06/29/17 1105 07/02/17 0741    .  Recent vital signs:  Vitals:   07/03/17 1956 07/04/17 0602  BP: 137/71 134/67  Pulse: 100 99  Resp: 15 16  Temp: 99.3 F (37.4 C) 98.8 F (37.1 C)  SpO2: 95% 91%    Recent laboratory studies:  Results for orders placed or performed during the hospital encounter of 06/27/17  Surgical pcr screen  Result Value Ref Range   MRSA, PCR NEGATIVE NEGATIVE   Staphylococcus aureus NEGATIVE NEGATIVE  Urine culture  Result Value Ref Range   Specimen Description URINE, CLEAN CATCH    Special Requests NONE    Culture NO GROWTH    Report Status 06/28/2017 FINAL   Basic metabolic panel  Result Value Ref Range   Sodium 138 135 - 145 mmol/L   Potassium 3.3 (L) 3.5 - 5.1 mmol/L    Chloride 102 101 - 111 mmol/L   CO2 27 22 - 32 mmol/L   Glucose, Bld 94 65 - 99 mg/dL   BUN 22 (H) 6 - 20 mg/dL   Creatinine, Ser 4.090.78 0.44 - 1.00 mg/dL   Calcium 9.0 8.9 - 81.110.3 mg/dL   GFR calc non Af Amer >60 >60 mL/min   GFR calc Af Amer >60 >60 mL/min   Anion gap 9 5 - 15  CBC  Result Value Ref Range   WBC 7.1 4.0 - 10.5 K/uL   RBC 4.87 3.87 - 5.11 MIL/uL   Hemoglobin 12.2 12.0 - 15.0 g/dL   HCT 91.439.7 78.236.0 - 95.646.0 %   MCV 81.5 78.0 - 100.0 fL   MCH 25.1 (L) 26.0 - 34.0 pg   MCHC 30.7 30.0 - 36.0 g/dL   RDW 21.315.9 (H) 08.611.5 - 57.815.5 %   Platelets 234 150 - 400 K/uL  Urinalysis, Routine w reflex microscopic  Result Value Ref Range   Color, Urine YELLOW YELLOW   APPearance CLEAR CLEAR   Specific Gravity, Urine 1.020 1.005 - 1.030   pH 6.0 5.0 - 8.0   Glucose, UA NEGATIVE NEGATIVE mg/dL   Hgb urine dipstick NEGATIVE NEGATIVE   Bilirubin Urine NEGATIVE NEGATIVE   Ketones, ur NEGATIVE NEGATIVE mg/dL   Protein, ur NEGATIVE NEGATIVE mg/dL   Nitrite NEGATIVE NEGATIVE   Leukocytes, UA NEGATIVE NEGATIVE  Type and screen  Order type and screen if day of surgery is less than 15 days from draw of preadmission visit or order morning of surgery if day of surgery is greater than 6 days from preadmission visit.  Result Value Ref Range   ABO/RH(D) O POS    Antibody Screen NEG    Sample Expiration 07/11/2017    Extend sample reason NO TRANSFUSIONS OR PREGNANCY IN THE PAST 3 MONTHS   ABO/Rh  Result Value Ref Range   ABO/RH(D) O POS     Discharge Medications:   Allergies as of 07/04/2017      Reactions   Wellbutrin [bupropion] Hives   Codeine Nausea Only      Medication List    STOP taking these medications   ALEVE 220 MG tablet Generic drug:  naproxen sodium   ALEVE PM 220-25 MG Tabs Generic drug:  Naproxen Sod-Diphenhydramine   diclofenac 75 MG EC tablet Commonly known as:  VOLTAREN   diclofenac sodium 1 % Gel Commonly known as:  VOLTAREN   vitamin E 400 UNIT capsule      TAKE these medications   CALCIUM 600+D3 PO Take 2 tablets by mouth daily.   hydrochlorothiazide 25 MG tablet Commonly known as:  HYDRODIURIL TAKE 1 TABLET BY MOUTH  DAILY   HYDROmorphone 2 MG tablet Commonly known as:  DILAUDID Take 1 tablet (2 mg total) by mouth every 4 (four) hours as needed for severe pain.   Magnesium 400 MG Tabs Take by mouth.   methocarbamol 500 MG tablet Commonly known as:  ROBAXIN Take 1 tablet (500 mg total) by mouth every 6 (six) hours as needed for muscle spasms.   pantoprazole 40 MG tablet Commonly known as:  PROTONIX Take 1 tablet (40 mg total) by mouth daily.   rivaroxaban 10 MG Tabs tablet Commonly known as:  XARELTO Take 1 tablet (10 mg total) by mouth daily with breakfast.   simvastatin 40 MG tablet Commonly known as:  ZOCOR Take 1 tablet (40 mg total) by mouth at bedtime.   SUPER B COMPLEX PO Take 1 tablet by mouth daily.   valACYclovir 500 MG tablet Commonly known as:  VALTREX TAKE 1 TABLET BY MOUTH  DAILY   venlafaxine XR 150 MG 24 hr capsule Commonly known as:  EFFEXOR-XR TAKE 1 CAPSULE BY MOUTH  DAILY WITH BREAKFAST What changed:    how much to take  how to take this  when to take this  additional instructions   Vitamin D-3 1000 units Caps Take 1 capsule by mouth daily.            Durable Medical Equipment  (From admission, onward)        Start     Ordered   07/02/17 1703  For home use only DME Walker rolling  Once    Question:  Patient needs a walker to treat with the following condition  Answer:  S/P total knee arthroplasty, left   07/02/17 1702   07/02/17 1701  For home use only DME 3 n 1  Once     07/02/17 1700      Diagnostic Studies: No results found.  Disposition: 01-Home or Self Care  Discharge Instructions    Call MD / Call 911   Complete by:  As directed    If you experience chest pain or shortness of breath, CALL 911 and be transported to the hospital emergency room.  If you develope a  fever above 101 F, pus (white drainage) or increased drainage  or redness at the wound, or calf pain, call your surgeon's office.   Constipation Prevention   Complete by:  As directed    Drink plenty of fluids.  Prune juice may be helpful.  You may use a stool softener, such as Colace (over the counter) 100 mg twice a day.  Use MiraLax (over the counter) for constipation as needed.   Diet - low sodium heart healthy   Complete by:  As directed    Discharge instructions   Complete by:  As directed    Weightbearing as tolerated Remove dressing next Monday.  Okay to shower at that time.  Okay to shower before then with the dressing in place as it is waterproof CPM machine 1 hour 3 times a day minimum.  Increase degrees daily Use the blue foam cradle boot to achieve full extension in the leg   Increase activity slowly as tolerated   Complete by:  As directed          Signed: Burnard BuntingG Scott Halayna Blane 07/04/2017, 3:36 PM

## 2017-07-05 DIAGNOSIS — I10 Essential (primary) hypertension: Secondary | ICD-10-CM | POA: Diagnosis not present

## 2017-07-05 DIAGNOSIS — Z471 Aftercare following joint replacement surgery: Secondary | ICD-10-CM | POA: Diagnosis not present

## 2017-07-06 DIAGNOSIS — Z471 Aftercare following joint replacement surgery: Secondary | ICD-10-CM | POA: Diagnosis not present

## 2017-07-06 DIAGNOSIS — I10 Essential (primary) hypertension: Secondary | ICD-10-CM | POA: Diagnosis not present

## 2017-07-11 ENCOUNTER — Telehealth (INDEPENDENT_AMBULATORY_CARE_PROVIDER_SITE_OTHER): Payer: Self-pay | Admitting: Orthopedic Surgery

## 2017-07-11 DIAGNOSIS — Z471 Aftercare following joint replacement surgery: Secondary | ICD-10-CM | POA: Diagnosis not present

## 2017-07-11 DIAGNOSIS — I10 Essential (primary) hypertension: Secondary | ICD-10-CM | POA: Diagnosis not present

## 2017-07-11 MED ORDER — HYDROMORPHONE HCL 2 MG PO TABS: 2.0000 mg | ORAL_TABLET | Freq: Four times a day (QID) | ORAL | 0 refills | Status: DC | PRN

## 2017-07-11 NOTE — Telephone Encounter (Signed)
ok 

## 2017-07-11 NOTE — Telephone Encounter (Signed)
IC advised could pick up at front desk.  

## 2017-07-11 NOTE — Telephone Encounter (Signed)
Please advise. Ok to rf?

## 2017-07-11 NOTE — Telephone Encounter (Signed)
Patient called needing Rx refilled (Hydromorphone)  The number to contact patient is [phone]

## 2017-07-12 DIAGNOSIS — I10 Essential (primary) hypertension: Secondary | ICD-10-CM | POA: Diagnosis not present

## 2017-07-12 DIAGNOSIS — Z471 Aftercare following joint replacement surgery: Secondary | ICD-10-CM | POA: Diagnosis not present

## 2017-07-13 DIAGNOSIS — Z471 Aftercare following joint replacement surgery: Secondary | ICD-10-CM | POA: Diagnosis not present

## 2017-07-13 DIAGNOSIS — I10 Essential (primary) hypertension: Secondary | ICD-10-CM | POA: Diagnosis not present

## 2017-07-16 ENCOUNTER — Ambulatory Visit (INDEPENDENT_AMBULATORY_CARE_PROVIDER_SITE_OTHER): Payer: 59

## 2017-07-16 ENCOUNTER — Ambulatory Visit (INDEPENDENT_AMBULATORY_CARE_PROVIDER_SITE_OTHER): Payer: 59 | Admitting: Orthopedic Surgery

## 2017-07-16 ENCOUNTER — Encounter (INDEPENDENT_AMBULATORY_CARE_PROVIDER_SITE_OTHER): Payer: Self-pay | Admitting: Orthopedic Surgery

## 2017-07-16 DIAGNOSIS — Z96652 Presence of left artificial knee joint: Secondary | ICD-10-CM

## 2017-07-16 MED ORDER — HYDROMORPHONE HCL 2 MG PO TABS: 2.0000 mg | ORAL_TABLET | Freq: Four times a day (QID) | ORAL | 0 refills | Status: DC | PRN

## 2017-07-16 NOTE — Progress Notes (Signed)
Post-Op Visit Note   Patient: Mia Kline           Date of Birth: Apr 19, 1956           MRN: [id number] Visit Date: 07/16/2017 PCP: Excell SeltzerBedsole, Amy E, MD   Assessment & Plan:  Chief Complaint:  Chief Complaint  Patient presents with  . Left Knee - Routine Post Op   Visit Diagnoses:  1. S/P total knee arthroplasty, left     Plan: Mia Kline is a patient is 2 weeks out left total knee replacement.  She is doing well.  On exam she has flexion past 90 and full extension.  Quad strength looks good and the incision looks good.  Radiographs also look good.  Plan is to start outpatient physical therapy next week and continue with CPM machine range of motion as tolerated.  Follow-up with me in 4 weeks.  1 more prescription pain medicine provided which is dated for the day after Christmas  Follow-Up Instructions: Return in about 4 weeks (around 08/13/2017).   Orders:  Orders Placed This Encounter  Procedures  . XR KNEE 3 VIEW LEFT   No orders of the defined types were placed in this encounter.   Imaging: Xr Knee 3 View Left  Result Date: 07/16/2017 AP lateral merchant left knee reviewed.  Total knee prosthesis in good position and alignment.  No complicating features.  No fractures.   PMFS History: Patient Active Problem List   Diagnosis Date Noted  . Arthritis of knee 07/02/2017  . Multiple joint pain 11/28/2016  . Low back pain with bilateral sciatica 11/28/2016  . Panic disorder without agoraphobia with moderate panic attacks 07/01/2015  . LLQ pain 07/01/2015  . Tinnitus 07/01/2015  . Chronic insomnia 10/03/2013  . Plantar fasciitis, bilateral 03/28/2013  . Fibromyalgia 06/26/2011  . MICROSCOPIC HEMATURIA 04/15/2010  . PARESTHESIA 09/28/2009  . PALPITATIONS, CHRONIC 09/28/2009  . Headache(784.0) 09/07/2009  . INTERNAL HEMORRHOIDS WITHOUT MENTION COMP 02/10/2009  . IBS 02/10/2009  . FATIGUE, CHRONIC 01/12/2009  . Chronic cough 10/02/2008  . GENITAL HERPES 02/12/2008    . Hyperlipemia 02/12/2008  . Anemia, iron deficiency 02/12/2008  . Major depressive disorder, recurrent episode, moderate (HCC) 02/12/2008  . COMMON MIGRAINE 02/12/2008  . Essential hypertension, benign 02/12/2008  . GERD 02/12/2008  . OSTEOARTHRITIS 02/12/2008  . ABDOMINAL PAIN, CHRONIC 02/12/2008  . HEART MURMUR, HX OF 02/12/2008   Past Medical History:  Diagnosis Date  . Abdominal pain, generalized   . Abdominal pain, unspecified site   . Acute cystitis   . Acute sinusitis, unspecified   . Anemia    Iron deficiency  . Arm pain, left   . Chest pain, unspecified   . Common migraine   . Complication of anesthesia   . Cough   . Depression   . Dizziness and giddiness   . Elbow pain   . Elbow, forearm, and wrist, abrasion or friction burn, without mention of infection   . Genital herpes, unspecified   . GERD (gastroesophageal reflux disease)   . Headache(784.0)   . Heart murmur   . Hyperlipidemia   . Hypertension   . IBS (irritable bowel syndrome)   . Internal hemorrhoids without mention of complication   . Knee pain, left   . Microscopic hematuria   . Osteoarthrosis, unspecified whether generalized or localized, unspecified site   . Other malaise and fatigue   . Other screening mammogram   . Palpitations   . Paresthesia   . PONV (postoperative nausea  and vomiting)   . Routine general medical examination at a health care facility   . Routine gynecological examination   . Small bowel obstruction (HCC)   . Urinary tract infection, site not specified     Family History  Problem Relation Age of Onset  . Cancer Mother        Breast  . Cancer Father        Lung  . Heart disease Father        CABG  . Colon polyps Sister   . Cancer Maternal Aunt        Cirrhosis of liver    Past Surgical History:  Procedure Laterality Date  . ABDOMINAL HYSTERECTOMY    . Barium Follow Through  2007   because rectum twisted and couldn't do colonoscopy, was painful though  .  barium swallow  2006   Hiatal hernia  . BILATERAL OOPHORECTOMY  2006   For large cyst  . CARDIOVASCULAR STRESS TEST  09/2009   Low risk  . PARTIAL HYSTERECTOMY  1998   Vaginal  . TOTAL KNEE ARTHROPLASTY Left 07/02/2017   Procedure: LEFT TOTAL KNEE ARTHROPLASTY;  Surgeon: Cammy Copaean, Elam Ellis Scott, MD;  [facility];  Service: Orthopedics;  Laterality: Left;  . TUBAL LIGATION     Social History   Occupational History  . Occupation: Medical sales representativeLogistics Analyst, Tax adviserAir Operations, [employer]  Tobacco Use  . Smoking status: Never Smoker  . Smokeless tobacco: Never Used  Substance and Sexual Activity  . Alcohol use: Yes    Alcohol/week: 0.0 oz  . Drug use: No    Comment: Remote but no injected.  Marland Kitchen. Sexual activity: Not on file

## 2017-07-17 DIAGNOSIS — I10 Essential (primary) hypertension: Secondary | ICD-10-CM | POA: Diagnosis not present

## 2017-07-17 DIAGNOSIS — Z471 Aftercare following joint replacement surgery: Secondary | ICD-10-CM | POA: Diagnosis not present

## 2017-07-19 ENCOUNTER — Other Ambulatory Visit (INDEPENDENT_AMBULATORY_CARE_PROVIDER_SITE_OTHER): Payer: Self-pay | Admitting: Orthopedic Surgery

## 2017-07-19 DIAGNOSIS — I10 Essential (primary) hypertension: Secondary | ICD-10-CM | POA: Diagnosis not present

## 2017-07-19 DIAGNOSIS — Z471 Aftercare following joint replacement surgery: Secondary | ICD-10-CM | POA: Diagnosis not present

## 2017-07-19 NOTE — Telephone Encounter (Signed)
Patient called needing Rx refilled for pain medicine. Patient advised she could only get part of her Rx filled due to insurance. The number to contact patient [phone]

## 2017-07-20 DIAGNOSIS — I10 Essential (primary) hypertension: Secondary | ICD-10-CM | POA: Diagnosis not present

## 2017-07-20 DIAGNOSIS — Z471 Aftercare following joint replacement surgery: Secondary | ICD-10-CM | POA: Diagnosis not present

## 2017-07-20 MED ORDER — HYDROMORPHONE HCL 2 MG PO TABS: ORAL_TABLET | ORAL | 0 refills | Status: DC

## 2017-07-20 NOTE — Telephone Encounter (Signed)
Erin NP ok'd refill. I put up front for patient to pick up. She is aware and will come by to pick up.

## 2017-07-20 NOTE — Telephone Encounter (Signed)
Please advise if ok to rf? 

## 2017-07-23 ENCOUNTER — Telehealth: Payer: Self-pay | Admitting: Family Medicine

## 2017-07-23 DIAGNOSIS — E782 Mixed hyperlipidemia: Secondary | ICD-10-CM

## 2017-07-23 DIAGNOSIS — D508 Other iron deficiency anemias: Secondary | ICD-10-CM

## 2017-07-23 NOTE — Telephone Encounter (Signed)
-----   Message from Alvina Chouerri J Walsh sent at 07/18/2017  3:20 PM EST ----- Regarding: Lab orders for Friday, 12.28.18 Lab orders for a 3 month follow up appt.

## 2017-07-25 DIAGNOSIS — M1712 Unilateral primary osteoarthritis, left knee: Secondary | ICD-10-CM | POA: Diagnosis not present

## 2017-07-25 NOTE — Telephone Encounter (Signed)
thx

## 2017-07-26 DIAGNOSIS — M1712 Unilateral primary osteoarthritis, left knee: Secondary | ICD-10-CM | POA: Diagnosis not present

## 2017-07-27 ENCOUNTER — Other Ambulatory Visit (INDEPENDENT_AMBULATORY_CARE_PROVIDER_SITE_OTHER): Payer: 59

## 2017-07-27 DIAGNOSIS — E782 Mixed hyperlipidemia: Secondary | ICD-10-CM

## 2017-07-27 LAB — COMPREHENSIVE METABOLIC PANEL
ALT: 19 U/L (ref 0–35)
AST: 20 U/L (ref 0–37)
Albumin: 4.4 g/dL (ref 3.5–5.2)
Alkaline Phosphatase: 95 U/L (ref 39–117)
BILIRUBIN TOTAL: 0.5 mg/dL (ref 0.2–1.2)
BUN: 18 mg/dL (ref 6–23)
CO2: 31 meq/L (ref 19–32)
Calcium: 9.3 mg/dL (ref 8.4–10.5)
Chloride: 102 mEq/L (ref 96–112)
Creatinine, Ser: 0.97 mg/dL (ref 0.40–1.20)
GFR: 61.92 mL/min (ref >60.00)
GLUCOSE: 104 mg/dL — AB (ref 70–99)
Potassium: 3.8 mEq/L (ref 3.5–5.1)
SODIUM: 142 meq/L (ref 135–145)
TOTAL PROTEIN: 7 g/dL (ref 6.0–8.3)

## 2017-07-27 LAB — LIPID PANEL
CHOL/HDL RATIO: 3
Cholesterol: 170 mg/dL (ref 0–200)
HDL: 53.2 mg/dL (ref >39.00)
LDL Cholesterol: 87 mg/dL (ref 0–99)
NONHDL: 117.09
Triglycerides: 149 mg/dL (ref 0.0–149.0)
VLDL: 29.8 mg/dL (ref 0.0–40.0)

## 2017-08-01 DIAGNOSIS — M1712 Unilateral primary osteoarthritis, left knee: Secondary | ICD-10-CM | POA: Diagnosis not present

## 2017-08-03 ENCOUNTER — Other Ambulatory Visit: Payer: Self-pay | Admitting: Family Medicine

## 2017-08-03 ENCOUNTER — Telehealth: Payer: Self-pay | Admitting: Family Medicine

## 2017-08-03 DIAGNOSIS — M1712 Unilateral primary osteoarthritis, left knee: Secondary | ICD-10-CM | POA: Diagnosis not present

## 2017-08-03 NOTE — Telephone Encounter (Signed)
Pharmacy Alert note received about Xarelto and diclofenac.  Call pt.. She was recently started on Xarelto by Dr. August Saucerean.  I believe she is on this for a limited time post op. Given 21 tabs on 12/5/ She should not take the diclofenac while on this given increase risk of bleeding. Once off Xarelto she could restart. She may be off now.  If she will be on Xarelto long term she will need to stop all NSAIDs including diclofenac.

## 2017-08-06 ENCOUNTER — Telehealth: Payer: Self-pay | Admitting: Family Medicine

## 2017-08-06 DIAGNOSIS — M1712 Unilateral primary osteoarthritis, left knee: Secondary | ICD-10-CM | POA: Diagnosis not present

## 2017-08-06 NOTE — Telephone Encounter (Signed)
Pt returned call for Mia Kline regarding medication interaction from the note. She should not be taking the Diclofenac and Xarelto together. She has completed the 21 days of Xarelto and did not take it with her Diclofenac.   She just put in for a refill for the Diclofenac.

## 2017-08-06 NOTE — Telephone Encounter (Signed)
Left message for Mia Kline to return call.  [facility] T[facility] Nurse to talk with patient when she calls back.

## 2017-08-08 DIAGNOSIS — M1712 Unilateral primary osteoarthritis, left knee: Secondary | ICD-10-CM | POA: Diagnosis not present

## 2017-08-13 ENCOUNTER — Ambulatory Visit (INDEPENDENT_AMBULATORY_CARE_PROVIDER_SITE_OTHER): Payer: 59 | Admitting: Orthopedic Surgery

## 2017-08-13 ENCOUNTER — Encounter (INDEPENDENT_AMBULATORY_CARE_PROVIDER_SITE_OTHER): Payer: Self-pay | Admitting: Orthopedic Surgery

## 2017-08-13 DIAGNOSIS — Z96652 Presence of left artificial knee joint: Secondary | ICD-10-CM

## 2017-08-13 NOTE — Progress Notes (Signed)
Post-Op Visit Note   Patient: Mia Kline           Date of Birth: Jul 04, 1956           MRN: [id number] Visit Date: 08/13/2017 PCP: Excell SeltzerBedsole, Amy E, MD   Assessment & Plan:  Chief Complaint:  Chief Complaint  Patient presents with  . Left Knee - Follow-up   Visit Diagnoses: No diagnosis found.  Plan: Mia PoundDeborah is now 62 years old with 6 weeks out left total knee replacement.  Still is having some pain which is to be expected.  On examination she has flexion 120 and full extension with no knee effusion.  Her gait is normal.  She is walking a lot with her dog.  Takes Dilaudid occasionally.  Just reordered her diclofenac to start as well.  She is also taking tumeric.  Plan at this time is for her to come back in 3 weeks and we can reassess her for return to work.  In general I think she is doing well from a objective point of view in terms of range of motion and strength and gait.  I think getting back on her diclofenac will help her.  Follow-Up Instructions: No Follow-up on file.   Orders:  No orders of the defined types were placed in this encounter.  No orders of the defined types were placed in this encounter.   Imaging: No results found.  PMFS History: Patient Active Problem List   Diagnosis Date Noted  . Arthritis of knee 07/02/2017  . Multiple joint pain 11/28/2016  . Low back pain with bilateral sciatica 11/28/2016  . Panic disorder without agoraphobia with moderate panic attacks 07/01/2015  . LLQ pain 07/01/2015  . Tinnitus 07/01/2015  . Chronic insomnia 10/03/2013  . Plantar fasciitis, bilateral 03/28/2013  . Fibromyalgia 06/26/2011  . MICROSCOPIC HEMATURIA 04/15/2010  . PARESTHESIA 09/28/2009  . PALPITATIONS, CHRONIC 09/28/2009  . Headache(784.0) 09/07/2009  . INTERNAL HEMORRHOIDS WITHOUT MENTION COMP 02/10/2009  . IBS 02/10/2009  . FATIGUE, CHRONIC 01/12/2009  . Chronic cough 10/02/2008  . GENITAL HERPES 02/12/2008  . Hyperlipemia 02/12/2008  . Anemia,  iron deficiency 02/12/2008  . Major depressive disorder, recurrent episode, moderate (HCC) 02/12/2008  . COMMON MIGRAINE 02/12/2008  . Essential hypertension, benign 02/12/2008  . GERD 02/12/2008  . OSTEOARTHRITIS 02/12/2008  . ABDOMINAL PAIN, CHRONIC 02/12/2008  . HEART MURMUR, HX OF 02/12/2008   Past Medical History:  Diagnosis Date  . Abdominal pain, generalized   . Abdominal pain, unspecified site   . Acute cystitis   . Acute sinusitis, unspecified   . Anemia    Iron deficiency  . Arm pain, left   . Chest pain, unspecified   . Common migraine   . Complication of anesthesia   . Cough   . Depression   . Dizziness and giddiness   . Elbow pain   . Elbow, forearm, and wrist, abrasion or friction burn, without mention of infection   . Genital herpes, unspecified   . GERD (gastroesophageal reflux disease)   . Headache(784.0)   . Heart murmur   . Hyperlipidemia   . Hypertension   . IBS (irritable bowel syndrome)   . Internal hemorrhoids without mention of complication   . Knee pain, left   . Microscopic hematuria   . Osteoarthrosis, unspecified whether generalized or localized, unspecified site   . Other malaise and fatigue   . Other screening mammogram   . Palpitations   . Paresthesia   . PONV (postoperative  nausea and vomiting)   . Routine general medical examination at a health care facility   . Routine gynecological examination   . Small bowel obstruction (HCC)   . Urinary tract infection, site not specified     Family History  Problem Relation Age of Onset  . Cancer Mother        Breast  . Cancer Father        Lung  . Heart disease Father        CABG  . Colon polyps Sister   . Cancer Maternal Aunt        Cirrhosis of liver    Past Surgical History:  Procedure Laterality Date  . ABDOMINAL HYSTERECTOMY    . Barium Follow Through  2007   because rectum twisted and couldn't do colonoscopy, was painful though  . barium swallow  2006   Hiatal hernia  .  BILATERAL OOPHORECTOMY  2006   For large cyst  . CARDIOVASCULAR STRESS TEST  09/2009   Low risk  . PARTIAL HYSTERECTOMY  1998   Vaginal  . TOTAL KNEE ARTHROPLASTY Left 07/02/2017   Procedure: LEFT TOTAL KNEE ARTHROPLASTY;  Surgeon: Cammy Copa, MD;  Location: [facility];  Service: Orthopedics;  Laterality: Left;  . TUBAL LIGATION     Social History   Occupational History  . Occupation: Medical sales representative, Tax adviser, [employer]  Tobacco Use  . Smoking status: Never Smoker  . Smokeless tobacco: Never Used  Substance and Sexual Activity  . Alcohol use: Yes    Alcohol/week: 0.0 oz  . Drug use: No    Comment: Remote but no injected.  Marland Kitchen Sexual activity: Not on file

## 2017-08-14 DIAGNOSIS — M1712 Unilateral primary osteoarthritis, left knee: Secondary | ICD-10-CM | POA: Diagnosis not present

## 2017-08-16 DIAGNOSIS — M1712 Unilateral primary osteoarthritis, left knee: Secondary | ICD-10-CM | POA: Diagnosis not present

## 2017-08-22 ENCOUNTER — Ambulatory Visit (INDEPENDENT_AMBULATORY_CARE_PROVIDER_SITE_OTHER): Payer: 59 | Admitting: Orthopedic Surgery

## 2017-09-03 ENCOUNTER — Encounter (INDEPENDENT_AMBULATORY_CARE_PROVIDER_SITE_OTHER): Payer: Self-pay | Admitting: Orthopedic Surgery

## 2017-09-03 ENCOUNTER — Ambulatory Visit (INDEPENDENT_AMBULATORY_CARE_PROVIDER_SITE_OTHER): Payer: 59 | Admitting: Orthopedic Surgery

## 2017-09-03 DIAGNOSIS — Z96652 Presence of left artificial knee joint: Secondary | ICD-10-CM

## 2017-09-04 ENCOUNTER — Telehealth (INDEPENDENT_AMBULATORY_CARE_PROVIDER_SITE_OTHER): Payer: Self-pay

## 2017-09-04 NOTE — Telephone Encounter (Signed)
Per patients request faxed work note from [employer] on 09/03/17 to [facility] [phone]

## 2017-09-08 ENCOUNTER — Encounter (INDEPENDENT_AMBULATORY_CARE_PROVIDER_SITE_OTHER): Payer: Self-pay | Admitting: Orthopedic Surgery

## 2017-09-08 NOTE — Progress Notes (Signed)
Post-Op Visit Note   Patient: Mia Kline           Date of Birth: 1956/07/09           MRN: [id number] Visit Date: 09/03/2017 PCP: Excell SeltzerBedsole, Amy E, MD   Assessment & Plan:  Chief Complaint:  Chief Complaint  Patient presents with  . Left Knee - Routine Post Op   Visit Diagnoses:  1. S/P total knee arthroplasty, left     Plan: Mia Kline is a patient who is now 2 months out left total knee replacement.  She is doing well overall.  She has good and bad days.  She finished physical therapy and is doing home exercise program.  Only takes pain medicine as needed.  She has done a lot of stairs.  She would like to return to work.  We will let her return to work without restrictions within the next several days.  See her back as needed.  I think she has very good range of motion likely unload the right knee enough that she could delay her right knee replacement.  Follow-Up Instructions: Return if symptoms worsen or fail to improve.   Orders:  No orders of the defined types were placed in this encounter.  No orders of the defined types were placed in this encounter.   Imaging: No results found.  PMFS History: Patient Active Problem List   Diagnosis Date Noted  . Arthritis of knee 07/02/2017  . Multiple joint pain 11/28/2016  . Low back pain with bilateral sciatica 11/28/2016  . Panic disorder without agoraphobia with moderate panic attacks 07/01/2015  . LLQ pain 07/01/2015  . Tinnitus 07/01/2015  . Chronic insomnia 10/03/2013  . Plantar fasciitis, bilateral 03/28/2013  . Fibromyalgia 06/26/2011  . MICROSCOPIC HEMATURIA 04/15/2010  . PARESTHESIA 09/28/2009  . PALPITATIONS, CHRONIC 09/28/2009  . Headache(784.0) 09/07/2009  . INTERNAL HEMORRHOIDS WITHOUT MENTION COMP 02/10/2009  . IBS 02/10/2009  . FATIGUE, CHRONIC 01/12/2009  . Chronic cough 10/02/2008  . GENITAL HERPES 02/12/2008  . Hyperlipemia 02/12/2008  . Anemia, iron deficiency 02/12/2008  . Major depressive  disorder, recurrent episode, moderate (HCC) 02/12/2008  . COMMON MIGRAINE 02/12/2008  . Essential hypertension, benign 02/12/2008  . GERD 02/12/2008  . OSTEOARTHRITIS 02/12/2008  . ABDOMINAL PAIN, CHRONIC 02/12/2008  . HEART MURMUR, HX OF 02/12/2008   Past Medical History:  Diagnosis Date  . Abdominal pain, generalized   . Abdominal pain, unspecified site   . Acute cystitis   . Acute sinusitis, unspecified   . Anemia    Iron deficiency  . Arm pain, left   . Chest pain, unspecified   . Common migraine   . Complication of anesthesia   . Cough   . Depression   . Dizziness and giddiness   . Elbow pain   . Elbow, forearm, and wrist, abrasion or friction burn, without mention of infection   . Genital herpes, unspecified   . GERD (gastroesophageal reflux disease)   . Headache(784.0)   . Heart murmur   . Hyperlipidemia   . Hypertension   . IBS (irritable bowel syndrome)   . Internal hemorrhoids without mention of complication   . Knee pain, left   . Microscopic hematuria   . Osteoarthrosis, unspecified whether generalized or localized, unspecified site   . Other malaise and fatigue   . Other screening mammogram   . Palpitations   . Paresthesia   . PONV (postoperative nausea and vomiting)   . Routine general medical examination at a  health care facility   . Routine gynecological examination   . Small bowel obstruction (HCC)   . Urinary tract infection, site not specified     Family History  Problem Relation Age of Onset  . Cancer Mother        Breast  . Cancer Father        Lung  . Heart disease Father        CABG  . Colon polyps Sister   . Cancer Maternal Aunt        Cirrhosis of liver    Past Surgical History:  Procedure Laterality Date  . ABDOMINAL HYSTERECTOMY    . Barium Follow Through  2007   because rectum twisted and couldn't do colonoscopy, was painful though  . barium swallow  2006   Hiatal hernia  . BILATERAL OOPHORECTOMY  2006   For large cyst  .  CARDIOVASCULAR STRESS TEST  09/2009   Low risk  . PARTIAL HYSTERECTOMY  1998   Vaginal  . TOTAL KNEE ARTHROPLASTY Left 07/02/2017   Procedure: LEFT TOTAL KNEE ARTHROPLASTY;  Surgeon: Cammy Copa, MD;  Location: [facility] OR;  Service: Orthopedics;  Laterality: Left;  . TUBAL LIGATION     Social History   Occupational History  . Occupation: Medical sales representative, Tax adviser, [employer]  Tobacco Use  . Smoking status: Never Smoker  . Smokeless tobacco: Never Used  Substance and Sexual Activity  . Alcohol use: Yes    Alcohol/week: 0.0 oz  . Drug use: No    Comment: Remote but no injected.  Marland Kitchen Sexual activity: Not on file

## 2017-09-11 ENCOUNTER — Other Ambulatory Visit: Payer: Self-pay | Admitting: Family Medicine

## 2017-09-11 NOTE — Telephone Encounter (Signed)
Let pt know cannot take if still on xarelto. If no longer on remove from list and refill.

## 2017-09-11 NOTE — Telephone Encounter (Signed)
Spoke with Mia Kline.  She states she is no longer on the Xarelto.  She only took that after her knee surgery.  Medication list updated.  Refill for Diclofenac sent into [facility].

## 2017-09-11 NOTE — Telephone Encounter (Signed)
Last office visit 04/27/2017.  Diclofenac not on current medication list.  Refill?

## 2017-10-19 ENCOUNTER — Telehealth (INDEPENDENT_AMBULATORY_CARE_PROVIDER_SITE_OTHER): Payer: Self-pay | Admitting: Orthopedic Surgery

## 2017-10-19 NOTE — Telephone Encounter (Signed)
Patient had knee replacement and wants to know when can she go to the dentist. Patient stated getting fluid in both knees. Stated she discussed w/Dr. August Saucerean.  Please call patient back to advise.

## 2017-10-22 NOTE — Telephone Encounter (Signed)
I recommend waiting at least 4 months out from total knee replacement before having elective dental work done and then when you get elective dental work done within the first year I would take oral antibiotics 1 hour before the work is done

## 2017-10-22 NOTE — Telephone Encounter (Signed)
I tried calling, no answer. LMVM for her advising per Dr August Saucerean.

## 2017-10-22 NOTE — Telephone Encounter (Signed)
Please advise. Thanks.  

## 2017-10-23 ENCOUNTER — Telehealth (INDEPENDENT_AMBULATORY_CARE_PROVIDER_SITE_OTHER): Payer: Self-pay | Admitting: Orthopedic Surgery

## 2017-10-23 NOTE — Telephone Encounter (Signed)
Patient was returning your phone call, wanted to speak with you about the fluid on her knee. CB #  [phone]

## 2017-10-23 NOTE — Telephone Encounter (Signed)
Do you have any recommendations for continued swelling in patients knees? Please advise. Thanks.

## 2017-10-24 ENCOUNTER — Other Ambulatory Visit: Payer: Self-pay | Admitting: Family Medicine

## 2017-10-24 MED ORDER — AMOXICILLIN 500 MG PO TABS: ORAL_TABLET | ORAL | 0 refills | Status: DC

## 2017-10-24 NOTE — Telephone Encounter (Signed)
Last office visit 04/27/2017.  Last refilled 04/10/2017 for #90 with 1 refill.  Ok to refill?

## 2017-10-24 NOTE — Telephone Encounter (Signed)
Tka will have swelling for several more months - the other knee she can ice wrap take nsaids or have drained

## 2017-10-24 NOTE — Addendum Note (Signed)
Addended byPrescott Parma: Dashawn Bartnick on: 10/24/2017 11:33 AM   Modules accepted: Orders

## 2017-10-24 NOTE — Telephone Encounter (Signed)
IC s/w patient and advised  

## 2017-11-14 ENCOUNTER — Encounter (INDEPENDENT_AMBULATORY_CARE_PROVIDER_SITE_OTHER): Payer: Self-pay | Admitting: Orthopedic Surgery

## 2017-11-14 ENCOUNTER — Ambulatory Visit (INDEPENDENT_AMBULATORY_CARE_PROVIDER_SITE_OTHER): Payer: 59 | Admitting: Orthopedic Surgery

## 2017-11-14 ENCOUNTER — Ambulatory Visit (INDEPENDENT_AMBULATORY_CARE_PROVIDER_SITE_OTHER): Payer: 59

## 2017-11-14 DIAGNOSIS — M5410 Radiculopathy, site unspecified: Secondary | ICD-10-CM

## 2017-11-14 DIAGNOSIS — M25562 Pain in left knee: Secondary | ICD-10-CM | POA: Diagnosis not present

## 2017-11-17 ENCOUNTER — Encounter (INDEPENDENT_AMBULATORY_CARE_PROVIDER_SITE_OTHER): Payer: Self-pay | Admitting: Orthopedic Surgery

## 2017-11-17 NOTE — Progress Notes (Signed)
Office Visit Note   Patient: Mia Kline           Date of Birth: 07-25-1956           MRN: [id number] Visit Date: 11/14/2017 Requested by: Excell SeltzerBedsole, Amy E, MD [address] [name], [place] [id number] PCP: Excell SeltzerBedsole, Amy E, MD  Subjective: Chief Complaint  Patient presents with  . Left Knee - Pain  . Right Knee - Pain    HPI: Mia PoundDeborah is a patient with bilateral knee pain.  Had left total knee replacement done in December last year.  Has had some continued pain and swelling.  Mostly anterior.  She also describes low back pain with radicular left leg and buttock pain.  She is been taking Aleve for the symptoms.  She is not having any fevers and chills.              ROS: All systems reviewed are negative as they relate to the chief complaint within the history of present illness.  Patient denies  fevers or chills.   Assessment & Plan: Visit Diagnoses:  1. Radicular leg pain     Plan: Impression is bilateral knee pain with fairly normal radiographs of the left knee.  Trace effusion in both knees but no evidence of infection.  Small ossicle off the patella inferior aspect is new compared to immediate postoperative radiographs but her extensor mechanism is completely intact.  Plan is observation.  I do not think there is any indication for further work-up or intervention.  If her back pain worsens then I would favor MRI scanning with possible epidural steroid injections.  We will see her back in 6 months  Follow-Up Instructions: Return in about 6 months (around 05/16/2018).   Orders:  Orders Placed This Encounter  Procedures  . XR Knee 1-2 Views Left  . XR Lumbar Spine 2-3 Views   No orders of the defined types were placed in this encounter.     Procedures: No procedures performed   Clinical Data: No additional findings.  Objective: Vital Signs: There were no vitals taken for this visit.  Physical Exam:   Constitutional: Patient appears well-developed HEENT:    Head: Normocephalic Eyes:EOM are normal Neck: Normal range of motion Cardiovascular: Normal rate Pulmonary/chest: Effort normal Neurologic: Patient is alert Skin: Skin is warm Psychiatric: Patient has normal mood and affect    Ortho Exam: Orthopedic exam demonstrates full active and passive range of motion of the left knee.  Both knees have trace effusion.  No warmth to the right knee.  Extensor mechanism intact with 5 out of 5 leg extension strength on the right.  Mildly positive nerve root tension signs on the left negative on the right.  No groin pain with internal or external rotation of either leg.  Impression is left knee pain status post total knee replacement with no definite evidence of complicating features.  I think we need to wait this out and see her back in 6 months.  Regarding the back pain I would favor observation for now with MRI scanning if needed.  Right now her symptoms are not too bad in the back.  Plain radiographs nondiagnostic for that  Specialty Comments:  No specialty comments available.  Imaging: No results found.   PMFS History: Patient Active Problem List   Diagnosis Date Noted  . Arthritis of knee 07/02/2017  . Multiple joint pain 11/28/2016  . Low back pain with bilateral sciatica 11/28/2016  . Panic disorder without  agoraphobia with moderate panic attacks 07/01/2015  . LLQ pain 07/01/2015  . Tinnitus 07/01/2015  . Chronic insomnia 10/03/2013  . Plantar fasciitis, bilateral 03/28/2013  . Fibromyalgia 06/26/2011  . MICROSCOPIC HEMATURIA 04/15/2010  . PARESTHESIA 09/28/2009  . PALPITATIONS, CHRONIC 09/28/2009  . Headache(784.0) 09/07/2009  . INTERNAL HEMORRHOIDS WITHOUT MENTION COMP 02/10/2009  . IBS 02/10/2009  . FATIGUE, CHRONIC 01/12/2009  . Chronic cough 10/02/2008  . GENITAL HERPES 02/12/2008  . Hyperlipemia 02/12/2008  . Anemia, iron deficiency 02/12/2008  . Major depressive disorder, recurrent episode, moderate (HCC) 02/12/2008  .  COMMON MIGRAINE 02/12/2008  . Essential hypertension, benign 02/12/2008  . GERD 02/12/2008  . OSTEOARTHRITIS 02/12/2008  . ABDOMINAL PAIN, CHRONIC 02/12/2008  . HEART MURMUR, HX OF 02/12/2008   Past Medical History:  Diagnosis Date  . Abdominal pain, generalized   . Abdominal pain, unspecified site   . Acute cystitis   . Acute sinusitis, unspecified   . Anemia    Iron deficiency  . Arm pain, left   . Chest pain, unspecified   . Common migraine   . Complication of anesthesia   . Cough   . Depression   . Dizziness and giddiness   . Elbow pain   . Elbow, forearm, and wrist, abrasion or friction burn, without mention of infection   . Genital herpes, unspecified   . GERD (gastroesophageal reflux disease)   . Headache(784.0)   . Heart murmur   . Hyperlipidemia   . Hypertension   . IBS (irritable bowel syndrome)   . Internal hemorrhoids without mention of complication   . Knee pain, left   . Microscopic hematuria   . Osteoarthrosis, unspecified whether generalized or localized, unspecified site   . Other malaise and fatigue   . Other screening mammogram   . Palpitations   . Paresthesia   . PONV (postoperative nausea and vomiting)   . Routine general medical examination at a health care facility   . Routine gynecological examination   . Small bowel obstruction (HCC)   . Urinary tract infection, site not specified     Family History  Problem Relation Age of Onset  . Cancer Mother        Breast  . Cancer Father        Lung  . Heart disease Father        CABG  . Colon polyps Sister   . Cancer Maternal Aunt        Cirrhosis of liver    Past Surgical History:  Procedure Laterality Date  . ABDOMINAL HYSTERECTOMY    . Barium Follow Through  2007   because rectum twisted and couldn't do colonoscopy, was painful though  . barium swallow  2006   Hiatal hernia  . BILATERAL OOPHORECTOMY  2006   For large cyst  . CARDIOVASCULAR STRESS TEST  09/2009   Low risk  . PARTIAL  HYSTERECTOMY  1998   Vaginal  . TOTAL KNEE ARTHROPLASTY Left 07/02/2017   Procedure: LEFT TOTAL KNEE ARTHROPLASTY;  Surgeon: Cammy Copa, MD;  Location: [facility];  Service: Orthopedics;  Laterality: Left;  . TUBAL LIGATION     Social History   Occupational History  . Occupation: Medical sales representative, Tax adviser, [employer]  Tobacco Use  . Smoking status: Never Smoker  . Smokeless tobacco: Never Used  Substance and Sexual Activity  . Alcohol use: Yes    Alcohol/week: 0.0 oz  . Drug use: No    Comment: Remote but no injected.  Marland Kitchen  Sexual activity: Not on file

## 2017-12-03 ENCOUNTER — Other Ambulatory Visit: Payer: Self-pay | Admitting: Family Medicine

## 2017-12-03 NOTE — Telephone Encounter (Signed)
Last office visit 04/27/2017.  Last refilled 09/11/2017 for #180 with no refills.  Ok to refill?

## 2018-02-15 ENCOUNTER — Other Ambulatory Visit: Payer: Self-pay | Admitting: Family Medicine

## 2018-04-09 ENCOUNTER — Other Ambulatory Visit: Payer: Self-pay | Admitting: Family Medicine

## 2018-04-20 ENCOUNTER — Telehealth: Payer: Self-pay | Admitting: Family Medicine

## 2018-04-20 DIAGNOSIS — D508 Other iron deficiency anemias: Secondary | ICD-10-CM

## 2018-04-20 DIAGNOSIS — E782 Mixed hyperlipidemia: Secondary | ICD-10-CM

## 2018-04-20 NOTE — Telephone Encounter (Signed)
-----   Message from Wendi MayaLauren Greeson, RT sent at 04/15/2018  9:20 AM EDT ----- Regarding: Lab orders for Wednesday 04/24/18 Please enter CPE lab orders for 04/24/18. Thanks!

## 2018-04-24 ENCOUNTER — Other Ambulatory Visit (INDEPENDENT_AMBULATORY_CARE_PROVIDER_SITE_OTHER): Payer: 59

## 2018-04-24 DIAGNOSIS — E782 Mixed hyperlipidemia: Secondary | ICD-10-CM

## 2018-04-24 DIAGNOSIS — D508 Other iron deficiency anemias: Secondary | ICD-10-CM

## 2018-04-24 LAB — CBC WITH DIFFERENTIAL/PLATELET
Basophils Absolute: 0 10*3/uL (ref 0.0–0.1)
Basophils Relative: 0.8 % (ref 0.0–3.0)
EOS PCT: 2.1 % (ref 0.0–5.0)
Eosinophils Absolute: 0.1 10*3/uL (ref 0.0–0.7)
HCT: 38.8 % (ref 36.0–46.0)
Hemoglobin: 12.4 g/dL (ref 12.0–15.0)
Lymphocytes Relative: 35.5 % (ref 12.0–46.0)
Lymphs Abs: 1.7 10*3/uL (ref 0.7–4.0)
MCHC: 32.1 g/dL (ref 30.0–36.0)
MCV: 77.2 fl — AB (ref 78.0–100.0)
MONOS PCT: 9 % (ref 3.0–12.0)
Monocytes Absolute: 0.4 10*3/uL (ref 0.1–1.0)
NEUTROS ABS: 2.6 10*3/uL (ref 1.4–7.7)
NEUTROS PCT: 52.6 % (ref 43.0–77.0)
Platelets: 191 10*3/uL (ref 150.0–400.0)
RBC: 5.02 Mil/uL (ref 3.87–5.11)
RDW: 17.5 % — ABNORMAL HIGH (ref 11.5–15.5)
WBC: 4.9 10*3/uL (ref 4.0–10.5)

## 2018-04-24 LAB — LIPID PANEL
CHOLESTEROL: 145 mg/dL (ref 0–200)
HDL: 60.3 mg/dL (ref >39.00)
LDL Cholesterol: 60 mg/dL (ref 0–99)
NonHDL: 84.8
TRIGLYCERIDES: 123 mg/dL (ref 0.0–149.0)
Total CHOL/HDL Ratio: 2
VLDL: 24.6 mg/dL (ref 0.0–40.0)

## 2018-04-24 LAB — COMPREHENSIVE METABOLIC PANEL
ALK PHOS: 59 U/L (ref 39–117)
ALT: 19 U/L (ref 0–35)
AST: 17 U/L (ref 0–37)
Albumin: 4.2 g/dL (ref 3.5–5.2)
BILIRUBIN TOTAL: 0.3 mg/dL (ref 0.2–1.2)
BUN: 23 mg/dL (ref 6–23)
CALCIUM: 9.1 mg/dL (ref 8.4–10.5)
CO2: 31 mEq/L (ref 19–32)
Chloride: 104 mEq/L (ref 96–112)
Creatinine, Ser: 0.92 mg/dL (ref 0.40–1.20)
GFR: 65.66 mL/min (ref >60.00)
Glucose, Bld: 110 mg/dL — ABNORMAL HIGH (ref 70–99)
Potassium: 4.3 mEq/L (ref 3.5–5.1)
Sodium: 142 mEq/L (ref 135–145)
TOTAL PROTEIN: 6.5 g/dL (ref 6.0–8.3)

## 2018-04-30 ENCOUNTER — Encounter (INDEPENDENT_AMBULATORY_CARE_PROVIDER_SITE_OTHER): Payer: Self-pay

## 2018-04-30 ENCOUNTER — Encounter: Payer: Self-pay | Admitting: Family Medicine

## 2018-04-30 ENCOUNTER — Ambulatory Visit (INDEPENDENT_AMBULATORY_CARE_PROVIDER_SITE_OTHER): Payer: 59 | Admitting: Family Medicine

## 2018-04-30 VITALS — BP 138/90 | HR 77 | Temp 98.7°F | Ht 67.0 in | Wt 216.0 lb

## 2018-04-30 DIAGNOSIS — I10 Essential (primary) hypertension: Secondary | ICD-10-CM | POA: Diagnosis not present

## 2018-04-30 DIAGNOSIS — Z0000 Encounter for general adult medical examination without abnormal findings: Secondary | ICD-10-CM

## 2018-04-30 DIAGNOSIS — E782 Mixed hyperlipidemia: Secondary | ICD-10-CM

## 2018-04-30 DIAGNOSIS — F331 Major depressive disorder, recurrent, moderate: Secondary | ICD-10-CM

## 2018-04-30 DIAGNOSIS — R7303 Prediabetes: Secondary | ICD-10-CM

## 2018-04-30 DIAGNOSIS — R413 Other amnesia: Secondary | ICD-10-CM | POA: Diagnosis not present

## 2018-04-30 NOTE — Patient Instructions (Addendum)
Work on low carb, low ice cream diet, ice exercise and weight.  Call to schedule mammogram on your own.  Please stop at the lab to have labs drawn.

## 2018-04-30 NOTE — Progress Notes (Signed)
Subjective:    Patient ID: Mia Kline, female    DOB: [birth year], 62 y.o.   MRN: [id number]  HPI The patient is here for annual wellness exam and preventative care.    Hypertension:   Borderline control today on HCTZ  Using medication without problems or lightheadedness: NAD Chest pain with exertion: NAD Edema:none Short of breath: NAD Average home BPs: 104-128/77 Other issues: She has lost 11 lbs in last year since her TKR Wt Readings from Last 3 Encounters:  04/30/18 216 lb (98 kg)  07/02/17 227 lb 11 oz (103.3 kg)  06/27/17 227 lb 11.2 oz (103.3 kg)   Prediabetes: slightly elevated... Has been eating a lot of ice cream lately. 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   Blood pressure 138/90, pulse 77, temperature 98.7 F (37.1 C), temperature source Oral, height 5\' 7"  (1.702 m), weight 216 lb (98 kg). Social History /Family History/Past Medical History reviewed in detail and updated in EMR if needed.  Review of Systems  Constitutional: Negative for fatigue and fever.  HENT: Negative for congestion.   Eyes: Negative for pain.  Respiratory: Negative for cough and shortness of breath.   Cardiovascular: Negative for chest pain, palpitations and leg swelling.  Gastrointestinal: Negative for abdominal pain.  Genitourinary: Negative for dysuria and vaginal bleeding.  Musculoskeletal: Negative for back pain.  Neurological: Negative for syncope, light-headedness and headaches.  Psychiatric/Behavioral: Negative for dysphoric mood.       Objective:   Physical Exam  Constitutional:  Vital signs are normal. She appears well-developed and well-nourished. She is cooperative.  Non-toxic appearance. She does not appear ill. No distress.  Overweight , central obesity.  HENT:  Head: Normocephalic.  Right Ear: Hearing, tympanic membrane, external ear and ear canal normal.  Left Ear: Hearing, tympanic membrane, external ear and ear canal normal.  Nose: Nose normal.  Eyes: Pupils are equal, round, and reactive to light. Conjunctivae, EOM and lids are normal. Lids are everted and swept, no foreign bodies found.  Neck: Trachea normal and normal range of motion. Neck supple. Carotid bruit is not present. No thyroid mass and no thyromegaly present.  Cardiovascular: Normal rate, regular rhythm, S1 normal, S2 normal, normal heart sounds and intact distal pulses. Exam reveals no gallop.  No murmur heard. Pulmonary/Chest: Effort normal and breath sounds normal. No respiratory distress. She has no wheezes. She has no rhonchi. She has no rales.  Abdominal: Soft. Normal appearance and bowel sounds are normal. She exhibits no distension, no fluid wave, no abdominal bruit and no mass. There is no hepatosplenomegaly. There is no tenderness. There is no rebound, no guarding and no CVA tenderness. No hernia.  Lymphadenopathy:    She has no cervical adenopathy.    She has no axillary adenopathy.  Neurological: She is alert. She has normal strength. No cranial nerve deficit or sensory deficit.  Skin: Skin is warm, dry and intact. No rash noted.  Psychiatric: Her speech is normal and behavior is normal. Judgment normal. Her mood appears not anxious. Cognition and memory are normal. She does not exhibit a depressed mood.  Assessment & Plan:  The patient's preventative maintenance and recommended screening tests for an annual wellness exam were reviewed in full today. Brought up to date unless services declined.  Counselled on the importance of diet, exercise, and its role in overall  health and mortality. The patient's FH and SH was reviewed, including their home life, tobacco status, and drug and alcohol status.   Partial hysterectomy,then BSO no pap neeeded, no DVE.  Nml Mammogram 02/2012, due,mother and father with breast cancer history.   Occ pain in both breast off and on. She has noted right breast larger than the right. Up to Date with vaccines, gets flu vaccine at work, refused shingles. Colon cancer screening:02/25/2009 nml, rpt in 10 years.      HCV Neg. Nonsmoker

## 2018-04-30 NOTE — Assessment & Plan Note (Signed)
Well controlled. Continue current medication. Encouraged exercise, weight loss, healthy eating habits.  

## 2018-04-30 NOTE — Assessment & Plan Note (Signed)
Stable control on venlafaxine 

## 2018-04-30 NOTE — Assessment & Plan Note (Signed)
Well controlled. Continue current medication.  

## 2018-05-01 LAB — TSH: TSH: 1.9 u[IU]/mL (ref 0.35–4.50)

## 2018-05-01 LAB — VITAMIN B12: VITAMIN B 12: 301 pg/mL (ref 211–911)

## 2018-05-01 LAB — VITAMIN D 25 HYDROXY (VIT D DEFICIENCY, FRACTURES): VITD: 52.16 ng/mL (ref 30.00–100.00)

## 2018-05-02 ENCOUNTER — Encounter: Payer: Self-pay | Admitting: *Deleted

## 2018-05-11 DIAGNOSIS — Z23 Encounter for immunization: Secondary | ICD-10-CM | POA: Diagnosis not present

## 2018-05-16 ENCOUNTER — Ambulatory Visit (INDEPENDENT_AMBULATORY_CARE_PROVIDER_SITE_OTHER): Payer: 59 | Admitting: Orthopedic Surgery

## 2018-05-20 ENCOUNTER — Ambulatory Visit (INDEPENDENT_AMBULATORY_CARE_PROVIDER_SITE_OTHER): Payer: 59 | Admitting: Orthopedic Surgery

## 2018-05-20 ENCOUNTER — Encounter (INDEPENDENT_AMBULATORY_CARE_PROVIDER_SITE_OTHER): Payer: Self-pay | Admitting: Orthopedic Surgery

## 2018-05-20 DIAGNOSIS — M1711 Unilateral primary osteoarthritis, right knee: Secondary | ICD-10-CM | POA: Diagnosis not present

## 2018-05-20 DIAGNOSIS — M1712 Unilateral primary osteoarthritis, left knee: Secondary | ICD-10-CM | POA: Diagnosis not present

## 2018-05-20 NOTE — Progress Notes (Signed)
Office Visit Note   Patient: Mia Kline           Date of Birth: 1955/08/31           MRN: [id number] Visit Date: 05/20/2018 Requested by: Excell Seltzer, MD [address] [place], [place] [address] PCP: Excell Seltzer, MD  Subjective: Chief Complaint  Patient presents with  . Left Knee - Follow-up    HPI: Mia Kline is a patient with left knee arthritis.  She had left total knee replacement done about 10 months ago.  Generally doing well but has some occasional pain.  Still is working in [employer].  Still is wearing a [employer] brace and rides motorcycle.              ROS: All systems reviewed are negative as they relate to the chief complaint within the history of present illness.  Patient denies  fevers or chills.   Assessment & Plan: Visit Diagnoses:  1. Unilateral primary osteoarthritis, left knee   2. Unilateral primary osteoarthritis, right knee     Plan: Impression is left total knee replacement which is well-functioning.  She is doing reasonably well with the right knee in terms of the arthritis symptoms.  Plan at this time is observation.  Continue with strengthening exercises.  I think she may need to get that right knee replaced sometime in the future but for now she looks good.  I will see her back as needed.  Follow-Up Instructions: Return if symptoms worsen or fail to improve.   Orders:  No orders of the defined types were placed in this encounter.  No orders of the defined types were placed in this encounter.     Procedures: No procedures performed   Clinical Data: No additional findings.  Objective: Vital Signs: There were no vitals taken for this visit.  Physical Exam:   Constitutional: Patient appears well-developed HEENT:  Head: Normocephalic Eyes:EOM are normal Neck: Normal range of motion Cardiovascular: Normal rate Pulmonary/chest: Effort normal Neurologic: Patient is alert Skin: Skin is warm Psychiatric: Patient has  normal mood and affect    Ortho Exam: Ortho exam demonstrates excellent range of motion of that left knee full extension to about 1 15-1 20 of flexion.  No real effusion in either knee.  Extensor mechanism is intact bilaterally.  Fair amount of patellofemoral crepitus on the right compared to the left.  No groin pain with internal/external Tatian of that left her right leg.  Pedal pulses palpable.  Specialty Comments:  No specialty comments available.  Imaging: No results found.   PMFS History: Patient Active Problem List   Diagnosis Date Noted  . Arthritis of knee 07/02/2017  . Multiple joint pain 11/28/2016  . Low back pain with bilateral sciatica 11/28/2016  . Panic disorder without agoraphobia with moderate panic attacks 07/01/2015  . LLQ pain 07/01/2015  . Tinnitus 07/01/2015  . Chronic insomnia 10/03/2013  . Plantar fasciitis, bilateral 03/28/2013  . Fibromyalgia 06/26/2011  . MICROSCOPIC HEMATURIA 04/15/2010  . PARESTHESIA 09/28/2009  . PALPITATIONS, CHRONIC 09/28/2009  . Headache(784.0) 09/07/2009  . INTERNAL HEMORRHOIDS WITHOUT MENTION COMP 02/10/2009  . IBS 02/10/2009  . FATIGUE, CHRONIC 01/12/2009  . Chronic cough 10/02/2008  . GENITAL HERPES 02/12/2008  . Hyperlipemia 02/12/2008  . Anemia, iron deficiency 02/12/2008  . Major depressive disorder, recurrent episode, moderate (HCC) 02/12/2008  . COMMON MIGRAINE 02/12/2008  . Essential hypertension, benign 02/12/2008  . GERD 02/12/2008  . OSTEOARTHRITIS 02/12/2008  . ABDOMINAL  PAIN, CHRONIC 02/12/2008  . HEART MURMUR, HX OF 02/12/2008   Past Medical History:  Diagnosis Date  . Abdominal pain, generalized   . Abdominal pain, unspecified site   . Acute cystitis   . Acute sinusitis, unspecified   . Anemia    Iron deficiency  . Arm pain, left   . Chest pain, unspecified   . Common migraine   . Complication of anesthesia   . Cough   . Depression   . Dizziness and giddiness   . Elbow pain   . Elbow,  forearm, and wrist, abrasion or friction burn, without mention of infection   . Genital herpes, unspecified   . GERD (gastroesophageal reflux disease)   . Headache(784.0)   . Heart murmur   . Hyperlipidemia   . Hypertension   . IBS (irritable bowel syndrome)   . Internal hemorrhoids without mention of complication   . Knee pain, left   . Microscopic hematuria   . Osteoarthrosis, unspecified whether generalized or localized, unspecified site   . Other malaise and fatigue   . Other screening mammogram   . Palpitations   . Paresthesia   . PONV (postoperative nausea and vomiting)   . Routine general medical examination at a health care facility   . Routine gynecological examination   . Small bowel obstruction (HCC)   . Urinary tract infection, site not specified     Family History  Problem Relation Age of Onset  . Cancer Mother        Breast  . Cancer Father        Lung  . Heart disease Father        CABG  . Colon polyps Sister   . Cancer Maternal Aunt        Cirrhosis of liver    Past Surgical History:  Procedure Laterality Date  . ABDOMINAL HYSTERECTOMY    . Barium Follow Through  2007   because rectum twisted and couldn't do colonoscopy, was painful though  . barium swallow  2006   Hiatal hernia  . BILATERAL OOPHORECTOMY  2006   For large cyst  . CARDIOVASCULAR STRESS TEST  09/2009   Low risk  . PARTIAL HYSTERECTOMY  1998   Vaginal  . TOTAL KNEE ARTHROPLASTY Left 07/02/2017   Procedure: LEFT TOTAL KNEE ARTHROPLASTY;  Surgeon: Cammy Copa, MD;  Location: [facility] OR;  Service: Orthopedics;  Laterality: Left;  . TUBAL LIGATION     Social History   Occupational History  . Occupation: Medical sales representative, Tax adviser, [employer]  Tobacco Use  . Smoking status: Never Smoker  . Smokeless tobacco: Never Used  Substance and Sexual Activity  . Alcohol use: Yes    Alcohol/week: 0.0 standard drinks  . Drug use: No    Comment: Remote but no injected.  Marland Kitchen  Sexual activity: Not on file

## 2018-06-11 ENCOUNTER — Other Ambulatory Visit: Payer: Self-pay | Admitting: Family Medicine

## 2018-08-08 ENCOUNTER — Other Ambulatory Visit: Payer: Self-pay | Admitting: Family Medicine

## 2018-09-14 ENCOUNTER — Other Ambulatory Visit: Payer: Self-pay | Admitting: Family Medicine

## 2018-10-24 ENCOUNTER — Other Ambulatory Visit: Payer: Self-pay | Admitting: Family Medicine

## 2018-12-05 ENCOUNTER — Telehealth: Payer: Self-pay | Admitting: Family Medicine

## 2018-12-05 NOTE — Telephone Encounter (Signed)
Left message asking pt to call office  Pt due for 6 month follow up depression.  Please schedule

## 2018-12-12 NOTE — Telephone Encounter (Signed)
Pt called back stating she is doing fine declined to schedule appointment.  She stated she is off work now.  Her work did labs and she will be dropping them off and wanted to know after dr Ermalene Searing looks at them will she need do to lab work for her cpx in oct

## 2018-12-12 NOTE — Telephone Encounter (Signed)
Probably not, but depends on what they ordered.

## 2019-01-14 ENCOUNTER — Other Ambulatory Visit: Payer: Self-pay | Admitting: Family Medicine

## 2019-01-17 ENCOUNTER — Encounter: Payer: Self-pay | Admitting: Gastroenterology

## 2019-02-19 ENCOUNTER — Telehealth: Payer: Self-pay | Admitting: Family Medicine

## 2019-02-19 NOTE — Telephone Encounter (Signed)
Pt dropped off biometric results for Dr. Diona Browner to have. Placed on cart.

## 2019-03-05 ENCOUNTER — Other Ambulatory Visit: Payer: Self-pay | Admitting: Family Medicine

## 2019-04-18 ENCOUNTER — Other Ambulatory Visit: Payer: Self-pay | Admitting: Family Medicine

## 2019-05-02 ENCOUNTER — Telehealth: Payer: Self-pay | Admitting: Family Medicine

## 2019-05-02 DIAGNOSIS — D508 Other iron deficiency anemias: Secondary | ICD-10-CM

## 2019-05-02 DIAGNOSIS — E782 Mixed hyperlipidemia: Secondary | ICD-10-CM

## 2019-05-02 NOTE — Addendum Note (Signed)
Addended by: Ellamae Sia on: 05/02/2019 03:56 PM   Modules accepted: Orders

## 2019-05-02 NOTE — Telephone Encounter (Signed)
-----   Message from Ellamae Sia sent at 05/02/2019  9:56 AM EDT ----- Regarding: Lab orders for Friday, 10.9.20 Patient is scheduled for CPX labs, please order future labs, Thanks , Karna Christmas

## 2019-05-02 NOTE — Telephone Encounter (Signed)
-----   Message from Ellamae Sia sent at 04/30/2019  3:15 PM EDT ----- Regarding: Lab orders for Friday, 10.9.20 Patient is scheduled for CPX labs, please order future labs, Thanks , Karna Christmas

## 2019-05-09 ENCOUNTER — Other Ambulatory Visit: Payer: Self-pay

## 2019-05-09 ENCOUNTER — Other Ambulatory Visit (INDEPENDENT_AMBULATORY_CARE_PROVIDER_SITE_OTHER): Payer: 59

## 2019-05-09 DIAGNOSIS — E782 Mixed hyperlipidemia: Secondary | ICD-10-CM

## 2019-05-09 DIAGNOSIS — D508 Other iron deficiency anemias: Secondary | ICD-10-CM | POA: Diagnosis not present

## 2019-05-09 LAB — COMPREHENSIVE METABOLIC PANEL
ALT: 21 U/L (ref 0–35)
AST: 19 U/L (ref 0–37)
Albumin: 4.3 g/dL (ref 3.5–5.2)
Alkaline Phosphatase: 61 U/L (ref 39–117)
BUN: 20 mg/dL (ref 6–23)
CO2: 30 mEq/L (ref 19–32)
Calcium: 9.4 mg/dL (ref 8.4–10.5)
Chloride: 105 mEq/L (ref 96–112)
Creatinine, Ser: 0.83 mg/dL (ref 0.40–1.20)
GFR: 69.34 mL/min (ref >60.00)
Glucose, Bld: 101 mg/dL — ABNORMAL HIGH (ref 70–99)
Potassium: 4.1 mEq/L (ref 3.5–5.1)
Sodium: 142 mEq/L (ref 135–145)
Total Bilirubin: 0.4 mg/dL (ref 0.2–1.2)
Total Protein: 6.3 g/dL (ref 6.0–8.3)

## 2019-05-09 LAB — CBC WITH DIFFERENTIAL/PLATELET
Basophils Absolute: 0 10*3/uL (ref 0.0–0.1)
Basophils Relative: 0.7 % (ref 0.0–3.0)
Eosinophils Absolute: 0.1 10*3/uL (ref 0.0–0.7)
Eosinophils Relative: 2.3 % (ref 0.0–5.0)
HCT: 39.2 % (ref 36.0–46.0)
Hemoglobin: 12.4 g/dL (ref 12.0–15.0)
Lymphocytes Relative: 34.7 % (ref 12.0–46.0)
Lymphs Abs: 1.9 10*3/uL (ref 0.7–4.0)
MCHC: 31.7 g/dL (ref 30.0–36.0)
MCV: 78.8 fl (ref 78.0–100.0)
Monocytes Absolute: 0.5 10*3/uL (ref 0.1–1.0)
Monocytes Relative: 9.2 % (ref 3.0–12.0)
Neutro Abs: 2.9 10*3/uL (ref 1.4–7.7)
Neutrophils Relative %: 53.1 % (ref 43.0–77.0)
Platelets: 202 10*3/uL (ref 150.0–400.0)
RBC: 4.98 Mil/uL (ref 3.87–5.11)
RDW: 17.9 % — ABNORMAL HIGH (ref 11.5–15.5)
WBC: 5.5 10*3/uL (ref 4.0–10.5)

## 2019-05-09 LAB — HEMOGLOBIN A1C: Hgb A1c MFr Bld: 6.1 % (ref 4.6–6.5)

## 2019-05-09 LAB — LIPID PANEL
Cholesterol: 169 mg/dL (ref 0–200)
HDL: 62 mg/dL (ref >39.00)
LDL Cholesterol: 76 mg/dL (ref 0–99)
NonHDL: 107.26
Total CHOL/HDL Ratio: 3
Triglycerides: 155 mg/dL — ABNORMAL HIGH (ref 0.0–149.0)
VLDL: 31 mg/dL (ref 0.0–40.0)

## 2019-05-09 NOTE — Progress Notes (Signed)
No critical labs need to be addressed urgently. We will discuss labs in detail at upcoming office visit.   

## 2019-05-13 ENCOUNTER — Other Ambulatory Visit: Payer: Self-pay

## 2019-05-13 ENCOUNTER — Encounter: Payer: Self-pay | Admitting: Family Medicine

## 2019-05-13 ENCOUNTER — Ambulatory Visit (INDEPENDENT_AMBULATORY_CARE_PROVIDER_SITE_OTHER): Payer: 59 | Admitting: Family Medicine

## 2019-05-13 ENCOUNTER — Encounter: Payer: 59 | Admitting: Family Medicine

## 2019-05-13 VITALS — BP 130/90 | HR 85 | Temp 98.7°F | Ht 66.75 in | Wt 223.5 lb

## 2019-05-13 DIAGNOSIS — F331 Major depressive disorder, recurrent, moderate: Secondary | ICD-10-CM

## 2019-05-13 DIAGNOSIS — R7303 Prediabetes: Secondary | ICD-10-CM | POA: Insufficient documentation

## 2019-05-13 DIAGNOSIS — Z1211 Encounter for screening for malignant neoplasm of colon: Secondary | ICD-10-CM

## 2019-05-13 DIAGNOSIS — D508 Other iron deficiency anemias: Secondary | ICD-10-CM

## 2019-05-13 DIAGNOSIS — Z0000 Encounter for general adult medical examination without abnormal findings: Secondary | ICD-10-CM | POA: Diagnosis not present

## 2019-05-13 DIAGNOSIS — I10 Essential (primary) hypertension: Secondary | ICD-10-CM | POA: Diagnosis not present

## 2019-05-13 DIAGNOSIS — E782 Mixed hyperlipidemia: Secondary | ICD-10-CM | POA: Diagnosis not present

## 2019-05-13 HISTORY — DX: Prediabetes: R73.03

## 2019-05-13 MED ORDER — AMOXICILLIN 500 MG PO TABS: ORAL_TABLET | ORAL | 0 refills | Status: DC

## 2019-05-13 NOTE — Assessment & Plan Note (Signed)
Well controlled. Continue current medication.  

## 2019-05-13 NOTE — Assessment & Plan Note (Signed)
Work on low carb diet. 

## 2019-05-13 NOTE — Patient Instructions (Addendum)
Follow BP at home.Marland Kitchen goal BP < 140/90.  Stop at lab on way out to pick  Up stool test.  Work on low carb diet, increase exercsie.

## 2019-05-13 NOTE — Progress Notes (Signed)
Chief Complaint  Patient presents with  . Annual Exam    History of Present Illness: HPI  The patient is here for annual wellness exam and preventative care.    L TKR last year by Dr. Marlou Sa.  right knee OA doing fairly well. She still has intermittant.  Hypertension:  Elevated in office today on HCTZ. BP Readings from Last 3 Encounters:  05/13/19 (!) 140/92  04/30/18 138/90  07/04/17 134/67  Using medication without problems or lightheadedness: none Chest pain with exertion:none Edema:none Short of breath:none Average home BPs: Other issues:  Elevated Cholesterol: good cotnrol on statin Lab Results  Component Value Date   CHOL 169 05/09/2019   HDL 62.00 05/09/2019   LDLCALC 76 05/09/2019   LDLDIRECT 119.0 04/20/2017   TRIG 155.0 (H) 05/09/2019   CHOLHDL 3 05/09/2019  Using medications without problems: Muscle aches:  Diet compliance: more sweets lately Exercise: minimal, but yard work Other complaints:  Prediabetes  Lab Results  Component Value Date   HGBA1C 6.1 05/09/2019   MDD, stable on venlafaxine , more stress when was out of work   COVID 19 screen No recent travel or known exposure to [place] The patient denies respiratory symptoms of COVID 19 at this time.  The importance of social distancing was discussed today.   Review of Systems  Constitutional: Negative for chills and fever.  HENT: Negative for congestion and ear pain.   Eyes: Negative for pain and redness.  Respiratory: Negative for cough and shortness of breath.   Cardiovascular: Negative for chest pain, palpitations and leg swelling.  Gastrointestinal: Negative for abdominal pain, blood in stool, constipation, diarrhea, nausea and vomiting.  Genitourinary: Negative for dysuria.  Musculoskeletal: Negative for falls and myalgias.  Skin: Negative for rash.  Neurological: Negative for dizziness.  Psychiatric/Behavioral: Negative for depression. The patient is not nervous/anxious.        Past Medical History:  Diagnosis Date  . Abdominal pain, generalized   . Abdominal pain, unspecified site   . Acute cystitis   . Acute sinusitis, unspecified   . Anemia    Iron deficiency  . Arm pain, left   . Chest pain, unspecified   . Common migraine   . Complication of anesthesia   . Cough   . Depression   . Dizziness and giddiness   . Elbow pain   . Elbow, forearm, and wrist, abrasion or friction burn, without mention of infection   . Genital herpes, unspecified   . GERD (gastroesophageal reflux disease)   . Headache(784.0)   . Heart murmur   . Hyperlipidemia   . Hypertension   . IBS (irritable bowel syndrome)   . Internal hemorrhoids without mention of complication   . Knee pain, left   . Microscopic hematuria   . Osteoarthrosis, unspecified whether generalized or localized, unspecified site   . Other malaise and fatigue   . Other screening mammogram   . Palpitations   . Paresthesia   . PONV (postoperative nausea and vomiting)   . Routine general medical examination at a health care facility   . Routine gynecological examination   . Small bowel obstruction ([place])   . Urinary tract infection, site not specified     reports that she has never smoked. She has never used smokeless tobacco. She reports current alcohol use. She reports that she does not use drugs.   Current Outpatient Medications:  .  amoxicillin (AMOXIL) 500 MG tablet, 2g 1hr prior to dental procedure, Disp: 20 tablet, Rfl:  0 .  B Complex-C (SUPER B COMPLEX PO), Take 1 tablet by mouth daily., Disp: , Rfl:  .  Calcium Carbonate-Vitamin D (CALCIUM 600+D3 PO), Take 2 tablets by mouth daily., Disp: , Rfl:  .  Cholecalciferol (VITAMIN D-3) 1000 UNITS CAPS, Take 1 capsule by mouth daily., Disp: , Rfl:  .  hydrochlorothiazide (HYDRODIURIL) 25 MG tablet, TAKE 1 TABLET BY MOUTH  DAILY, Disp: 90 tablet, Rfl: 1 .  Magnesium 400 MG TABS, Take by mouth., Disp: , Rfl:  .  pantoprazole (PROTONIX) 40 MG tablet, TAKE  1 TABLET BY MOUTH  DAILY, Disp: 90 tablet, Rfl: 3 .  simvastatin (ZOCOR) 40 MG tablet, TAKE 1 TABLET BY MOUTH AT  BEDTIME, Disp: 90 tablet, Rfl: 0 .  venlafaxine XR (EFFEXOR-XR) 150 MG 24 hr capsule, TAKE 1 CAPSULE BY MOUTH  DAILY WITH BREAKFAST, Disp: 90 capsule, Rfl: 0   Observations/Objective: Blood pressure (!) 140/92, pulse 85, temperature 98.7 F (37.1 C), temperature source Temporal, height 5' 6.75" (1.695 m), weight 223 lb 8 oz (101.4 kg), SpO2 95 %.  Physical Exam Constitutional:      General: She is not in acute distress.    Appearance: Normal appearance. She is well-developed. She is not ill-appearing or toxic-appearing.  HENT:     Head: Normocephalic.     Right Ear: Hearing, tympanic membrane, ear canal and external ear normal.     Left Ear: Hearing, tympanic membrane, ear canal and external ear normal.     Nose: Nose normal.  Eyes:     General: Lids are normal. Lids are everted, no foreign bodies appreciated.     Conjunctiva/sclera: Conjunctivae normal.     Pupils: Pupils are equal, round, and reactive to light.  Neck:     Musculoskeletal: Normal range of motion and neck supple.     Thyroid: No thyroid mass or thyromegaly.     Vascular: No carotid bruit.     Trachea: Trachea normal.  Cardiovascular:     Rate and Rhythm: Normal rate and regular rhythm.     Heart sounds: Normal heart sounds, S1 normal and S2 normal. No murmur. No gallop.   Pulmonary:     Effort: Pulmonary effort is normal. No respiratory distress.     Breath sounds: Normal breath sounds. No wheezing, rhonchi or rales.  Abdominal:     General: Bowel sounds are normal. There is no distension or abdominal bruit.     Palpations: Abdomen is soft. There is no fluid wave or mass.     Tenderness: There is no abdominal tenderness. There is no guarding or rebound.     Hernia: No hernia is present.  Lymphadenopathy:     Cervical: No cervical adenopathy.  Skin:    General: Skin is warm and dry.     Findings:  No rash.  Neurological:     Mental Status: She is alert.     Cranial Nerves: No cranial nerve deficit.     Sensory: No sensory deficit.  Psychiatric:        Mood and Affect: Mood is not anxious or depressed.        Speech: Speech normal.        Behavior: Behavior normal. Behavior is cooperative.        Judgment: Judgment normal.      Assessment and Plan The patient's preventative maintenance and recommended screening tests for an annual wellness exam were reviewed in full today. Brought up to date unless services declined.  Counselled on the  importance of diet, exercise, and its role in overall health and mortality. The patient's FH and SH was reviewed, including their home life, tobacco status, and drug and alcohol status.   Partial hysterectomy,then BSO,  no pap neeeded, no DVE.  Nml Mammogram 02/2012, due,mother and father with breast cancer history.   Occ pain in both breast off and on. She has noted right breast larger than the right... plans mammogram next week. Up to Date with vaccines, gets flu vaccine  , flu at work 04/2019, refused shingles. Colon cancer screening:02/25/2009 nml,  She does not want to repeat colonsoicpy.. will do IFOB instead.      HCV Neg. Nonsmoker  Essential hypertension, benign [place]line in office. Encouraged exercise, weight loss, healthy eating habits.  Check at home.. consider additional med if continuing to run high.  Anemia, iron deficiency Resolved.  Major depressive disorder, recurrent episode, moderate (HCC) Well controlled. Continue current medication.   Prediabetes Work on low carb diet.  Hyperlipemia Well controlled. Continue current medication.      Kerby NoraAmy Zakhari Fogel, MD

## 2019-05-13 NOTE — Assessment & Plan Note (Signed)
Resolved

## 2019-05-13 NOTE — Assessment & Plan Note (Signed)
Borederline in office. Encouraged exercise, weight loss, healthy eating habits.  Check at home.. consider additional med if continuing to run high.

## 2019-06-09 ENCOUNTER — Encounter: Payer: Self-pay | Admitting: *Deleted

## 2019-06-09 ENCOUNTER — Other Ambulatory Visit (INDEPENDENT_AMBULATORY_CARE_PROVIDER_SITE_OTHER): Payer: 59

## 2019-06-09 ENCOUNTER — Other Ambulatory Visit: Payer: Self-pay | Admitting: Family Medicine

## 2019-06-09 DIAGNOSIS — Z1211 Encounter for screening for malignant neoplasm of colon: Secondary | ICD-10-CM | POA: Diagnosis not present

## 2019-06-09 LAB — FECAL OCCULT BLOOD, IMMUNOCHEMICAL: Fecal Occult Bld: NEGATIVE

## 2019-07-01 ENCOUNTER — Telehealth: Payer: Self-pay

## 2019-07-01 NOTE — Telephone Encounter (Signed)
Pt left v/m wanting to make sure pantoprazole refills were sent to mail order pharmacy. I called pt and she said the med had just come in the mail and nothing further needed.

## 2019-08-03 ENCOUNTER — Other Ambulatory Visit: Payer: Self-pay | Admitting: Family Medicine

## 2019-11-06 ENCOUNTER — Other Ambulatory Visit: Payer: Self-pay | Admitting: Family Medicine

## 2020-03-18 ENCOUNTER — Other Ambulatory Visit: Payer: Self-pay

## 2020-03-18 ENCOUNTER — Encounter: Payer: Self-pay | Admitting: Family Medicine

## 2020-03-18 ENCOUNTER — Ambulatory Visit (INDEPENDENT_AMBULATORY_CARE_PROVIDER_SITE_OTHER): Payer: 59 | Admitting: Family Medicine

## 2020-03-18 DIAGNOSIS — R21 Rash and other nonspecific skin eruption: Secondary | ICD-10-CM

## 2020-03-18 MED ORDER — TRIAMCINOLONE ACETONIDE 0.1 % EX CREA: 1.0000 "application " | TOPICAL_CREAM | Freq: Two times a day (BID) | CUTANEOUS | 0 refills | Status: DC

## 2020-03-18 NOTE — Progress Notes (Signed)
Subjective:    Patient ID: Mia Kline, female    DOB: 04/27/1956, 64 y.o.   MRN: [id number]  This visit occurred during the SARS-CoV-2 public health emergency.  Safety protocols were in place, including screening questions prior to the visit, additional usage of staff PPE, and extensive cleaning of exam room while observing appropriate contact time as indicated for disinfecting solutions.    HPI  64 yo pt of Dr Ermalene Searing  Pt presents with rash on R arm  For about a week  Itchy -used calamine and neosporin  (not painful, just itchy)    Now no longer itches  Redness is starting to improve  ? If blisters   No known poison ivy contacts   She mows the grass   Patient Active Problem List   Diagnosis Date Noted  . Rash 03/18/2020  . Prediabetes 05/13/2019  . Arthritis of knee 07/02/2017  . Multiple joint pain 11/28/2016  . Low back pain with bilateral sciatica 11/28/2016  . Panic disorder without agoraphobia with moderate panic attacks 07/01/2015  . LLQ pain 07/01/2015  . Tinnitus 07/01/2015  . Chronic insomnia 10/03/2013  . Plantar fasciitis, bilateral 03/28/2013  . Fibromyalgia 06/26/2011  . MICROSCOPIC HEMATURIA 04/15/2010  . PARESTHESIA 09/28/2009  . PALPITATIONS, CHRONIC 09/28/2009  . Headache(784.0) 09/07/2009  . INTERNAL HEMORRHOIDS WITHOUT MENTION COMP 02/10/2009  . IBS 02/10/2009  . FATIGUE, CHRONIC 01/12/2009  . Chronic cough 10/02/2008  . GENITAL HERPES 02/12/2008  . Hyperlipemia 02/12/2008  . Anemia, iron deficiency 02/12/2008  . Major depressive disorder, recurrent episode, moderate (HCC) 02/12/2008  . COMMON MIGRAINE 02/12/2008  . Essential hypertension, benign 02/12/2008  . GERD 02/12/2008  . OSTEOARTHRITIS 02/12/2008  . ABDOMINAL PAIN, CHRONIC 02/12/2008  . HEART MURMUR, HX OF 02/12/2008   Past Medical History:  Diagnosis Date  . Abdominal pain, generalized   . Abdominal pain, unspecified site   . Acute cystitis   . Acute sinusitis,  unspecified   . Anemia    Iron deficiency  . Arm pain, left   . Chest pain, unspecified   . Common migraine   . Complication of anesthesia   . Cough   . Depression   . Dizziness and giddiness   . Elbow pain   . Elbow, forearm, and wrist, abrasion or friction burn, without mention of infection   . Genital herpes, unspecified   . GERD (gastroesophageal reflux disease)   . Headache(784.0)   . Heart murmur   . Hyperlipidemia   . Hypertension   . IBS (irritable bowel syndrome)   . Internal hemorrhoids without mention of complication   . Knee pain, left   . Microscopic hematuria   . Osteoarthrosis, unspecified whether generalized or localized, unspecified site   . Other malaise and fatigue   . Other screening mammogram   . Palpitations   . Paresthesia   . PONV (postoperative nausea and vomiting)   . Routine general medical examination at a health care facility   . Routine gynecological examination   . Small bowel obstruction (HCC)   . Urinary tract infection, site not specified    Past Surgical History:  Procedure Laterality Date  . ABDOMINAL HYSTERECTOMY    . Barium Follow Through  2007   because rectum twisted and couldn't do colonoscopy, was painful though  . barium swallow  2006   Hiatal hernia  . BILATERAL OOPHORECTOMY  2006   For large cyst  . CARDIOVASCULAR STRESS TEST  09/2009   Low risk  . PARTIAL  HYSTERECTOMY  1998   Vaginal  . TOTAL KNEE ARTHROPLASTY Left 07/02/2017   Procedure: LEFT TOTAL KNEE ARTHROPLASTY;  Surgeon: Cammy Copa, MD;  Location: [facility] OR;  Service: Orthopedics;  Laterality: Left;  . TUBAL LIGATION     Social History   Tobacco Use  . Smoking status: Never Smoker  . Smokeless tobacco: Never Used  Vaping Use  . Vaping Use: Never used  Substance Use Topics  . Alcohol use: Yes    Alcohol/week: 0.0 standard drinks  . Drug use: No    Comment: Remote but no injected.   Family History  Problem Relation Age of Onset  . Cancer Mother         Breast  . Cancer Father        Lung  . Heart disease Father        CABG  . Colon polyps Sister   . Cancer Maternal Aunt        Cirrhosis of liver   Allergies  Allergen Reactions  . Wellbutrin [Bupropion] Hives  . Codeine Nausea Only   Current Outpatient Medications on File Prior to Visit  Medication Sig Dispense Refill  . amoxicillin (AMOXIL) 500 MG tablet 2g 1hr prior to dental procedure 20 tablet 0  . B Complex-C (SUPER B COMPLEX PO) Take 1 tablet by mouth daily.    . Calcium Carbonate-Vitamin D (CALCIUM 600+D3 PO) Take 2 tablets by mouth daily.    . Cholecalciferol (VITAMIN D-3) 1000 UNITS CAPS Take 1 capsule by mouth daily.    . hydrochlorothiazide (HYDRODIURIL) 25 MG tablet TAKE 1 TABLET BY MOUTH  DAILY 90 tablet 1  . Magnesium 400 MG TABS Take by mouth.    . pantoprazole (PROTONIX) 40 MG tablet TAKE 1 TABLET BY MOUTH  DAILY 90 tablet 3  . simvastatin (ZOCOR) 40 MG tablet TAKE 1 TABLET BY MOUTH AT  BEDTIME 90 tablet 3  . venlafaxine XR (EFFEXOR-XR) 150 MG 24 hr capsule TAKE 1 CAPSULE BY MOUTH  DAILY WITH BREAKFAST 90 capsule 1   No current facility-administered medications on file prior to visit.     Review of Systems  Constitutional: Negative for activity change, appetite change, fatigue, fever and unexpected weight change.  HENT: Negative for congestion, ear pain, rhinorrhea, sinus pressure and sore throat.   Eyes: Negative for pain, redness and visual disturbance.  Respiratory: Negative for cough, shortness of breath and wheezing.   Cardiovascular: Negative for chest pain and palpitations.  Gastrointestinal: Negative for abdominal pain, blood in stool, constipation and diarrhea.  Endocrine: Negative for polydipsia and polyuria.  Genitourinary: Negative for dysuria, frequency and urgency.  Musculoskeletal: Negative for arthralgias, back pain and myalgias.  Skin: Positive for rash. Negative for pallor and wound.  Allergic/Immunologic: Negative for environmental  allergies.  Neurological: Negative for dizziness, syncope and headaches.  Hematological: Negative for adenopathy. Does not bruise/bleed easily.  Psychiatric/Behavioral: Negative for decreased concentration and dysphoric mood. The patient is not nervous/anxious.        Objective:   Physical Exam Constitutional:      General: She is not in acute distress.    Appearance: Normal appearance. She is obese. She is not ill-appearing.  HENT:     Mouth/Throat:     Mouth: Mucous membranes are moist.  Eyes:     General:        Right eye: No discharge.        Left eye: No discharge.     Conjunctiva/sclera: Conjunctivae normal.  Pupils: Pupils are equal, round, and reactive to light.  Pulmonary:     Effort: Pulmonary effort is normal. No respiratory distress.     Breath sounds: No wheezing.  Musculoskeletal:     Cervical back: Normal range of motion and neck supple.  Lymphadenopathy:     Cervical: No cervical adenopathy.  Skin:    General: Skin is warm and dry.     Coloration: Skin is not pale.     Findings: Rash present.     Comments: 3-4 cm oval area of healed vesicles and erythema - medial R forearm Few excoriations  Does not blanche  No signs of infection  No weeping or drainage or pustules   Neurological:     Mental Status: She is alert.     Sensory: No sensory deficit.  Psychiatric:        Mood and Affect: Mood normal.           Assessment & Plan:   Problem List Items Addressed This Visit      Musculoskeletal and Integument   Rash    Itchy, papular/red rash on R forearm  Improving with calamine and neosporin but not resolved  Suspect a contact dermatitis (like poison ivy/oak)  Px triamcinolone cream for bid use Update if not starting to improve in a week or if worsening  Disc s/s of infection to watch for

## 2020-03-18 NOTE — Assessment & Plan Note (Signed)
Itchy, papular/red rash on R forearm  Improving with calamine and neosporin but not resolved  Suspect a contact dermatitis (like poison ivy/oak)  Px triamcinolone cream for bid use Update if not starting to improve in a week or if worsening  Disc s/s of infection to watch for

## 2020-03-18 NOTE — Patient Instructions (Addendum)
Keep the rash clean with soap and water  Use the triamcinolone cream as needed   If rash worsens or gets more itchy or causes painful please let me know   Watch out for poison ivy (or similar plants) on your property   If you work outdoors- wash all your shoes/clothes afterwards

## 2020-05-07 ENCOUNTER — Other Ambulatory Visit: Payer: Self-pay

## 2020-05-07 MED ORDER — TRIAMCINOLONE ACETONIDE 0.1 % EX CREA
1.0000 | TOPICAL_CREAM | Freq: Two times a day (BID) | CUTANEOUS | 0 refills | Status: DC
Start: 2020-05-07 — End: 2020-05-18

## 2020-05-07 NOTE — Telephone Encounter (Signed)
Pt has rash under breast and lower belly due to sweating; pt has had this rash for 1 wk but pt said has rash flair up periodically when sweating and has appt for annual on 05/18/20 and pt has been using the triamcinolone cream and it is helping but pt is out of med and request refill; pt does not want to schedule appt for rash and will discuss further at annual on 05/18/20. [facility] [name].pt request cb after reviewed by Dr Ermalene Searing.

## 2020-05-07 NOTE — Telephone Encounter (Signed)
[facility] Night - Client TELEPHONE ADVICE RECORD AccessNurse Patient Name: Mia Kline Gender: Female DOB: 1956/01/10 Age: 64 Y 4 M 26 D Return Phone Number: [phone] (Primary), [phone] (Secondary) Address: [place] [address] Client [facility] Night - Client Client Site  [facility] [place] - Night Physician Kerby Nora - MD Contact Type Call Who Is Calling Patient / Member / Family / Caregiver Call Type Triage / Clinical Relationship To Patient Self Return Phone Number [phone] (Secondary) Chief Complaint Rash - Widespread Reason for Call Medication Question / Request Initial Comment Caller wants to get a refill for a cream for a rash. She usually sees Dr. Ermalene Searing. [facility] [address] The rash she came in for went away but now has developed rashes in different places. Translation No Nurse Assessment Nurse: Nolene Ebbs, RN, Dawn Date/Time (Eastern Time): 05/06/2020 5:24:44 PM Confirm and document reason for call. If symptomatic, describe symptoms. ---Caller wants to get a refill for a cream (triamcinolone) for a rash. She usually sees Dr. Ermalene Searing. [facility] [address]. The rash she came in for went away but now has developed rashes under breast and belly . Does the patient have any new or worsening symptoms? ---Yes Will a triage be completed? ---Yes Related visit to physician within the last 2 weeks? ---Yes Does the PT have any chronic conditions? (i.e. diabetes, asthma, this includes High risk factors for pregnancy, etc.) ---No Is this a behavioral health or substance abuse call? ---No Guidelines Guideline Title Affirmed Question Affirmed Notes Nurse Date/Time (Eastern Time) Rash or Redness - Widespread Mild widespread rash Nolene Ebbs, RN, [facility] 05/06/2020 5:30:39 PM Disp. Time [name] Time) Disposition Final User 05/06/2020 5:36:41 PM SEE PCP WITHIN 3 DAYS Yes Nolene Ebbs, RN,  Media planner Disagree/Comply Comply Caller Understands Yes PLEASE NOTE: All timestamps contained within this report are represented as [place] Standard Time. CONFIDENTIALTY NOTICE: This fax transmission is intended only for the addressee. It contains information that is legally privileged, confidential or otherwise protected from use or disclosure. If you are not the intended recipient, you are strictly prohibited from reviewing, disclosing, copying using or disseminating any of this information or taking any action in reliance on or regarding this information. If you have received this fax in error, please notify us immediately by telephone so that we can arrange for its return to [place]. Phone: [phone], Toll-Free: [phone], Fax: [phone] Page: 2 of 2 Call Id: [id number] PreDisposition Call Doctor Care Advice Given Per Guideline SEE PCP WITHIN 3 DAYS: * You need to be seen within 2 or 3 days. BENADRYL FOR ITCHING: * Take Benadryl (OTC diphenhydramine) 4 times per day until seen. * You can use hydrocortisone for very itchy spots. * Put 1% hydrocortisone cream on the itchy area(s) 3 times a day. Use it for a couple days, until it feels better. This will help decrease the itching. * This is an over-thecounter (OTC) drug. You can buy it at the drugstore. * Some people like to keep the cream in the refrigerator. It feels even better if the cream is used when it is cold. * Read the instructions and warnings on the package insert for all medicines you take. CALL BACK IF: * Rash becomes purple or blood-colored or blister-like * Fever occurs or severe itching * You become worse CARE ADVICE given per Rash - Widespread and Cause Unknown (Adult) guideline.

## 2020-05-07 NOTE — Telephone Encounter (Signed)
Mia Kline notified by telephone that Dr. Ermalene Searing sent in refill for her.

## 2020-05-10 ENCOUNTER — Telehealth: Payer: Self-pay | Admitting: Family Medicine

## 2020-05-10 DIAGNOSIS — D508 Other iron deficiency anemias: Secondary | ICD-10-CM

## 2020-05-10 DIAGNOSIS — E7800 Pure hypercholesterolemia, unspecified: Secondary | ICD-10-CM

## 2020-05-10 DIAGNOSIS — R7303 Prediabetes: Secondary | ICD-10-CM

## 2020-05-10 NOTE — Telephone Encounter (Signed)
-----   Message from Alvina Chou sent at 04/27/2020  3:00 PM EDT ----- Regarding: Lab orders for Tuesday, 10.11.21 Patient is scheduled for CPX labs, please order future labs, Thanks , Camelia Eng

## 2020-05-11 ENCOUNTER — Other Ambulatory Visit (INDEPENDENT_AMBULATORY_CARE_PROVIDER_SITE_OTHER): Payer: 59

## 2020-05-11 ENCOUNTER — Other Ambulatory Visit: Payer: Self-pay

## 2020-05-11 DIAGNOSIS — R7303 Prediabetes: Secondary | ICD-10-CM | POA: Diagnosis not present

## 2020-05-11 DIAGNOSIS — E7800 Pure hypercholesterolemia, unspecified: Secondary | ICD-10-CM | POA: Diagnosis not present

## 2020-05-11 DIAGNOSIS — D508 Other iron deficiency anemias: Secondary | ICD-10-CM | POA: Diagnosis not present

## 2020-05-11 LAB — COMPREHENSIVE METABOLIC PANEL
ALT: 27 U/L (ref 0–35)
AST: 22 U/L (ref 0–37)
Albumin: 4.4 g/dL (ref 3.5–5.2)
Alkaline Phosphatase: 64 U/L (ref 39–117)
BUN: 14 mg/dL (ref 6–23)
CO2: 30 mEq/L (ref 19–32)
Calcium: 9.4 mg/dL (ref 8.4–10.5)
Chloride: 102 mEq/L (ref 96–112)
Creatinine, Ser: 0.91 mg/dL (ref 0.40–1.20)
GFR: 66.48 mL/min (ref >60.00)
Glucose, Bld: 94 mg/dL (ref 70–99)
Potassium: 3.8 mEq/L (ref 3.5–5.1)
Sodium: 140 mEq/L (ref 135–145)
Total Bilirubin: 0.5 mg/dL (ref 0.2–1.2)
Total Protein: 6.7 g/dL (ref 6.0–8.3)

## 2020-05-11 LAB — HEMOGLOBIN A1C: Hgb A1c MFr Bld: 5.6 % (ref 4.6–6.5)

## 2020-05-11 LAB — LIPID PANEL
Cholesterol: 189 mg/dL (ref 0–200)
HDL: 63.4 mg/dL (ref >39.00)
LDL Cholesterol: 96 mg/dL (ref 0–99)
NonHDL: 125.18
Total CHOL/HDL Ratio: 3
Triglycerides: 145 mg/dL (ref 0.0–149.0)
VLDL: 29 mg/dL (ref 0.0–40.0)

## 2020-05-11 LAB — CBC WITH DIFFERENTIAL/PLATELET
Basophils Absolute: 0 10*3/uL (ref 0.0–0.1)
Basophils Relative: 0.5 % (ref 0.0–3.0)
Eosinophils Absolute: 0.1 10*3/uL (ref 0.0–0.7)
Eosinophils Relative: 1.8 % (ref 0.0–5.0)
HCT: 43.1 % (ref 36.0–46.0)
Hemoglobin: 14.2 g/dL (ref 12.0–15.0)
Lymphocytes Relative: 35.3 % (ref 12.0–46.0)
Lymphs Abs: 2.1 10*3/uL (ref 0.7–4.0)
MCHC: 32.9 g/dL (ref 30.0–36.0)
MCV: 89.5 fl (ref 78.0–100.0)
Monocytes Absolute: 0.5 10*3/uL (ref 0.1–1.0)
Monocytes Relative: 8.1 % (ref 3.0–12.0)
Neutro Abs: 3.3 10*3/uL (ref 1.4–7.7)
Neutrophils Relative %: 54.3 % (ref 43.0–77.0)
Platelets: 210 10*3/uL (ref 150.0–400.0)
RBC: 4.82 Mil/uL (ref 3.87–5.11)
RDW: 14.1 % (ref 11.5–15.5)
WBC: 6.1 10*3/uL (ref 4.0–10.5)

## 2020-05-11 LAB — IBC + FERRITIN
Ferritin: 15.6 ng/mL (ref 10.0–291.0)
Iron: 76 ug/dL (ref 42–145)
Saturation Ratios: 17.4 % — ABNORMAL LOW (ref 20.0–50.0)
Transferrin: 312 mg/dL (ref 212.0–360.0)

## 2020-05-11 NOTE — Progress Notes (Signed)
No critical labs need to be addressed urgently. We will discuss labs in detail at upcoming office visit.   

## 2020-05-16 ENCOUNTER — Other Ambulatory Visit: Payer: Self-pay | Admitting: Family Medicine

## 2020-05-18 ENCOUNTER — Encounter: Payer: Self-pay | Admitting: Family Medicine

## 2020-05-18 ENCOUNTER — Ambulatory Visit (INDEPENDENT_AMBULATORY_CARE_PROVIDER_SITE_OTHER): Payer: 59 | Admitting: Family Medicine

## 2020-05-18 ENCOUNTER — Other Ambulatory Visit: Payer: Self-pay

## 2020-05-18 VITALS — BP 170/104 | HR 75 | Temp 98.1°F | Resp 20 | Ht 66.73 in | Wt 221.0 lb

## 2020-05-18 DIAGNOSIS — R413 Other amnesia: Secondary | ICD-10-CM | POA: Diagnosis not present

## 2020-05-18 DIAGNOSIS — E7800 Pure hypercholesterolemia, unspecified: Secondary | ICD-10-CM

## 2020-05-18 DIAGNOSIS — Z1211 Encounter for screening for malignant neoplasm of colon: Secondary | ICD-10-CM

## 2020-05-18 DIAGNOSIS — I10 Essential (primary) hypertension: Secondary | ICD-10-CM

## 2020-05-18 DIAGNOSIS — D508 Other iron deficiency anemias: Secondary | ICD-10-CM

## 2020-05-18 DIAGNOSIS — L304 Erythema intertrigo: Secondary | ICD-10-CM | POA: Insufficient documentation

## 2020-05-18 DIAGNOSIS — Z23 Encounter for immunization: Secondary | ICD-10-CM

## 2020-05-18 DIAGNOSIS — F331 Major depressive disorder, recurrent, moderate: Secondary | ICD-10-CM | POA: Diagnosis not present

## 2020-05-18 DIAGNOSIS — Z0000 Encounter for general adult medical examination without abnormal findings: Secondary | ICD-10-CM | POA: Diagnosis not present

## 2020-05-18 DIAGNOSIS — B372 Candidiasis of skin and nail: Secondary | ICD-10-CM

## 2020-05-18 HISTORY — DX: Candidiasis of skin and nail: B37.2

## 2020-05-18 LAB — VITAMIN D 25 HYDROXY (VIT D DEFICIENCY, FRACTURES): VITD: 29.33 ng/mL — ABNORMAL LOW (ref 30.00–100.00)

## 2020-05-18 LAB — TSH: TSH: 2.15 u[IU]/mL (ref 0.35–4.50)

## 2020-05-18 LAB — VITAMIN B12: Vitamin B-12: 415 pg/mL (ref 211–911)

## 2020-05-18 MED ORDER — TRIAMCINOLONE ACETONIDE 0.1 % EX CREA
1.0000 | TOPICAL_CREAM | Freq: Two times a day (BID) | CUTANEOUS | 0 refills | Status: DC
Start: 2020-05-18 — End: 2021-12-16

## 2020-05-18 MED ORDER — NYSTATIN 100000 UNIT/GM EX CREA: 1.0000 "application " | TOPICAL_CREAM | Freq: Two times a day (BID) | CUTANEOUS | 0 refills | Status: AC

## 2020-05-18 NOTE — Assessment & Plan Note (Signed)
Not at goal in office today on HCTZ

## 2020-05-18 NOTE — Assessment & Plan Note (Addendum)
Moderately well controlled. Continue current medication. May be contributing to memory issues.

## 2020-05-18 NOTE — Patient Instructions (Addendum)
Start regular exercise as able.Marland Kitchen 3-5 days a week. Complete stool testing for colon cancer.  Please stop at the lab to have labs drawn.   Please call the location of your choice from the menu below to schedule your Mammogram and/or Bone Density appointment.    [place]   1. [facility]                      Phone:  [phone] [address]                               [place], [place] [address]                                                             Services: Traditional and 3D Mammogram, Bone Density   2. [facility]                 Phone: [phone] [address]                                                       [place], [place] [address]    Service: Bone Density ONLY   *this site does NOT perform mammograms  3. [facility]                        Phone:  [phone] [address]                                  [place], [place] [address]                                            Services:  3D Mammogram and Bone Density    [place]  1. [facility]   Phone:  [phone]   [address]                                                                            [place], [place] [address]                                            Services: 3D Mammogram and Bone Density  2. [facility])  Phone:  [phone]   [address]  [place], [place] [address]                                              Services:  3D Mammogram and Bone Density

## 2020-05-18 NOTE — Assessment & Plan Note (Signed)
Eval with labs.. return for memory testing. Consider sleep evaluation vs neuro referral.

## 2020-05-18 NOTE — Assessment & Plan Note (Signed)
Treat with topical nystatin, triamcinolone for itching prn.

## 2020-05-18 NOTE — Assessment & Plan Note (Signed)
Resolved and iron levels in normal range

## 2020-05-18 NOTE — Assessment & Plan Note (Signed)
LDL at goal on statin. 

## 2020-05-18 NOTE — Progress Notes (Signed)
Chief Complaint  Patient presents with  . Annual Exam    Pt c/o hot spots    History of Present Illness: HPI  The patient is here for annual wellness exam and preventative care.    She has noted memory worsening in last few years... worse with stress from COVID.  Forgetting where thing are, getting lost more often.   No known sleep issue or snoring, no known apnea spells. Father with memory issue... no diagnosis, lung cancer, ? 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  Her chronic headaches are not as bad as in past. She had sleep study in past.. could not sleep well at study.  She has red warm areas under breast and under pannus. Comes and goes.. more painful than itchy.  triamcinolone helps some.  Iron def anemia: resolved and iron levels in normal range  Hypertension:    Not at goal in office today on HCTZ BP Readings from Last 3 Encounters:  05/18/20 (!) 152/96  03/18/20 136/84  05/13/19 130/90  Using medication without problems or lightheadedness: none Chest pain with exertion:none Edema:none Short of breath: none Average home BPs: not checking Other issues:  Fibromyalgia/chronic fatigue: tolerable control  Elevated Cholesterol:  Excellent control on simvastatin. Lab Results  Component Value Date   CHOL 189 05/11/2020   HDL 63.40 05/11/2020   LDLCALC 96 05/11/2020   LDLDIRECT 119.0 04/20/2017   TRIG 145.0 05/11/2020   CHOLHDL 3 05/11/2020  Using medications without problems: Muscle aches:  Diet compliance: moderate Exercise: minimal Other complaints: Prediabetes  Lab Results  Component Value Date   HGBA1C 5.6 05/11/2020     MDD/panic attacks. Moderate control on venlafaxine 150 mg.. dog passed away.  Still tearful all the time.  Sleeps well at night.   Office Visit from 05/18/2020 in [facility] Total Score 0      This visit occurred during the SARS-CoV-2 public health emergency.  Safety protocols  were in place, including screening questions prior to the visit, additional usage of staff PPE, and extensive cleaning of exam room while observing appropriate contact time as indicated for disinfecting solutions.   COVID 19 screen:  No recent travel or known exposure to COVID19 The patient denies respiratory symptoms of COVID 19 at this time. The importance of social distancing was discussed today.     Review of Systems  Constitutional: Negative for chills and fever.  HENT: Negative for congestion and ear pain.   Eyes: Negative for pain and redness.  Respiratory: Negative for cough and shortness of breath.   Cardiovascular: Negative for chest pain, palpitations and leg swelling.  Gastrointestinal: Negative for abdominal pain, blood in stool, constipation, diarrhea, nausea and vomiting.  Genitourinary: Negative for dysuria.  Musculoskeletal: Negative for falls and myalgias.  Skin: Negative for rash.  Neurological: Negative for dizziness.  Psychiatric/Behavioral: Negative for depression. The patient is not nervous/anxious.       Past Medical History:  Diagnosis Date  . Abdominal pain, generalized   . Abdominal pain, unspecified site   . Acute cystitis   . Acute sinusitis, unspecified   . Anemia    Iron deficiency  . Arm pain, left   . Chest pain, unspecified   . Common migraine   . Complication of anesthesia   . Cough   . Depression   . Dizziness and giddiness   . Elbow pain   . Elbow, forearm, and wrist, abrasion or friction burn, without mention  of infection   . Genital herpes, unspecified   . GERD (gastroesophageal reflux disease)   . Headache(784.0)   . Heart murmur   . Hyperlipidemia   . Hypertension   . IBS (irritable bowel syndrome)   . Internal hemorrhoids without mention of complication   . Knee pain, left   . Microscopic hematuria   . Osteoarthrosis, unspecified whether generalized or localized, unspecified site   . Other malaise and fatigue   . Other  screening mammogram   . Palpitations   . Paresthesia   . PONV (postoperative nausea and vomiting)   . Routine general medical examination at a health care facility   . Routine gynecological examination   . Small bowel obstruction (HCC)   . Urinary tract infection, site not specified     reports that she has never smoked. She has never used smokeless tobacco. She reports current alcohol use. She reports that she does not use drugs.   Current Outpatient Medications:  .  amoxicillin (AMOXIL) 500 MG tablet, 2g 1hr prior to dental procedure, Disp: 20 tablet, Rfl: 0 .  B Complex-C (SUPER B COMPLEX PO), Take 1 tablet by mouth daily., Disp: , Rfl:  .  Calcium Carbonate-Vitamin D (CALCIUM 600+D3 PO), Take 2 tablets by mouth daily., Disp: , Rfl:  .  Cholecalciferol (VITAMIN D-3) 1000 UNITS CAPS, Take 1 capsule by mouth daily., Disp: , Rfl:  .  hydrochlorothiazide (HYDRODIURIL) 25 MG tablet, TAKE 1 TABLET BY MOUTH  DAILY, Disp: 90 tablet, Rfl: 1 .  Magnesium 400 MG TABS, Take by mouth., Disp: , Rfl:  .  pantoprazole (PROTONIX) 40 MG tablet, TAKE 1 TABLET BY MOUTH  DAILY, Disp: 90 tablet, Rfl: 3 .  simvastatin (ZOCOR) 40 MG tablet, TAKE 1 TABLET BY MOUTH AT  BEDTIME, Disp: 90 tablet, Rfl: 3 .  triamcinolone cream (KENALOG) 0.1 %, Apply 1 application topically 2 (two) times daily. Apply to affected area twice daily (rash), Disp: 15 g, Rfl: 0 .  venlafaxine XR (EFFEXOR-XR) 150 MG 24 hr capsule, TAKE 1 CAPSULE BY MOUTH  DAILY WITH BREAKFAST, Disp: 90 capsule, Rfl: 1   Observations/Objective: Blood pressure (!) 152/96, pulse 75, temperature 98.1 F (36.7 C), resp. rate 20, height 5' 6.73" (1.695 m), weight 221 lb (100.2 kg), SpO2 96 %.  Physical Exam Constitutional:      General: She is not in acute distress.    Appearance: Normal appearance. She is well-developed. She is obese. She is not ill-appearing or toxic-appearing.  HENT:     Head: Normocephalic.     Right Ear: Hearing, tympanic membrane,  ear canal and external ear normal.     Left Ear: Hearing, tympanic membrane, ear canal and external ear normal.     Nose: Nose normal.  Eyes:     General: Lids are normal. Lids are everted, no foreign bodies appreciated.     Conjunctiva/sclera: Conjunctivae normal.     Pupils: Pupils are equal, round, and reactive to light.  Neck:     Thyroid: No thyroid mass or thyromegaly.     Vascular: No carotid bruit.     Trachea: Trachea normal.  Cardiovascular:     Rate and Rhythm: Normal rate and regular rhythm.     Heart sounds: Normal heart sounds, S1 normal and S2 normal. No murmur heard.  No gallop.   Pulmonary:     Effort: Pulmonary effort is normal. No respiratory distress.     Breath sounds: Normal breath sounds. No wheezing, rhonchi or rales.  Abdominal:     General: Bowel sounds are normal. There is no distension or abdominal bruit.     Palpations: Abdomen is soft. There is no fluid wave or mass.     Tenderness: There is no abdominal tenderness. There is no guarding or rebound.     Hernia: No hernia is present.  Musculoskeletal:     Cervical back: Normal range of motion and neck supple.  Lymphadenopathy:     Cervical: No cervical adenopathy.  Skin:    General: Skin is warm and dry.     Findings: No rash.     Comments: Multiple SKs and erythema under bialteral breasts and under pannus  Neurological:     Mental Status: She is alert.     Cranial Nerves: No cranial nerve deficit.     Sensory: No sensory deficit.  Psychiatric:        Mood and Affect: Mood is not anxious or depressed.        Speech: Speech normal.        Behavior: Behavior normal. Behavior is cooperative.        Judgment: Judgment normal.      Assessment and Plan    The patient's preventative maintenance and recommended screening tests for an annual wellness exam were reviewed in full today. Brought up to date unless services declined.  Counselled on the importance of diet, exercise, and its role in overall  health and mortality. The patient's FH and SH was reviewed, including their home life, tobacco status, and drug and alcohol status.   Partial hysterectomy,then BSO,  no pap neeeded, no DVE.  Nml Mammogram 02/2012, due, mother with breast cancer history.  Per pt she had one done at work 2019 Up to Date with vaccines including COVID series, except due for  Td ( refused), flu ( gets at work), refused shingles. Colon cancer screening:02/25/2009 nml,  She does not want to repeat colonscopy.. will do IFOB instead. Last negative 06/2019.      HCV Neg. Nonsmoker  Major depressive disorder, recurrent episode, moderate (HCC) Moderately well controlled. Continue current medication. May be contributing to memory issues.   Hyperlipidemia LDL at goal on statin.  Essential hypertension, benign Not at goal in office today on HCTZ  Anemia, iron deficiency Resolved and iron levels in normal range  Intertrigo Treat with topical nystatin, triamcinolone for itching prn.  Memory loss Eval with labs.. return for memory testing. Consider sleep evaluation vs neuro referral.     Kerby Nora, MD

## 2020-05-19 MED ORDER — VITAMIN D3 1.25 MG (50000 UT) PO CAPS: 1.0000 | ORAL_CAPSULE | ORAL | 0 refills | Status: DC

## 2020-05-19 NOTE — Addendum Note (Signed)
Addended by: Damita Lack on: 05/19/2020 03:45 PM   Modules accepted: Orders

## 2020-05-25 ENCOUNTER — Other Ambulatory Visit (INDEPENDENT_AMBULATORY_CARE_PROVIDER_SITE_OTHER): Payer: 59

## 2020-05-25 DIAGNOSIS — Z1211 Encounter for screening for malignant neoplasm of colon: Secondary | ICD-10-CM

## 2020-05-25 LAB — FECAL OCCULT BLOOD, IMMUNOCHEMICAL: Fecal Occult Bld: NEGATIVE

## 2020-06-01 ENCOUNTER — Encounter: Payer: Self-pay | Admitting: Family Medicine

## 2020-06-01 ENCOUNTER — Ambulatory Visit (INDEPENDENT_AMBULATORY_CARE_PROVIDER_SITE_OTHER)
Admission: RE | Admit: 2020-06-01 | Discharge: 2020-06-01 | Disposition: A | Discharge status: Home / Self Care | Payer: 59 | Source: Ambulatory Visit | Attending: Family Medicine | Admitting: Family Medicine

## 2020-06-01 ENCOUNTER — Ambulatory Visit (INDEPENDENT_AMBULATORY_CARE_PROVIDER_SITE_OTHER): Payer: 59 | Admitting: Family Medicine

## 2020-06-01 ENCOUNTER — Other Ambulatory Visit: Payer: Self-pay

## 2020-06-01 VITALS — BP 140/90 | HR 81 | Temp 98.2°F | Ht 66.73 in | Wt 221.8 lb

## 2020-06-01 DIAGNOSIS — G4486 Cervicogenic headache: Secondary | ICD-10-CM

## 2020-06-01 DIAGNOSIS — M542 Cervicalgia: Secondary | ICD-10-CM

## 2020-06-01 DIAGNOSIS — R413 Other amnesia: Secondary | ICD-10-CM | POA: Diagnosis not present

## 2020-06-01 DIAGNOSIS — F331 Major depressive disorder, recurrent, moderate: Secondary | ICD-10-CM

## 2020-06-01 HISTORY — DX: Cervicalgia: M54.2

## 2020-06-01 HISTORY — DX: Cervicogenic headache: G44.86

## 2020-06-01 NOTE — Progress Notes (Signed)
Chief Complaint  Patient presents with   Memory Evaluation    History of Present Illness: HPI   64 year old female presents for memory evaluation.   She reports she has noted decline in memory for several years... worse during COVID and with extra stress in last year-2. Was out of work for 6 months. Gradual change, not sudden.  No associated neuro changes.   Has started having issues with sense of time, forgetting where she puts things. Forgetting names... recall is an issue. Trouble focusing and paying attention.  When started back at work.. she was having issues functioning.  Coworkers have noted a change. Sleeping well at night...8 hours  Feels lack of motivation. No anhedonia.  Stress level is back to normal but memory is not. Has had issues in past with mood.   Vit D slightly low at 29 at last check on 05/08/20 Nml TSH and B12.   She has noted headaches..  sharp pain off and on in .. shoots from neck to back of head. Aleve helps  No numbness, no vision change, no  New neuro changes.  Neck cracks.  No weakness in arms or radiation of pain to arms. MRI brain 2012 unremarkable  This visit occurred during the SARS-CoV-2 public health emergency.  Safety protocols were in place, including screening questions prior to the visit, additional usage of staff PPE, and extensive cleaning of exam room while observing appropriate contact time as indicated for disinfecting solutions.   COVID 19 screen:  No recent travel or known exposure to COVID19 The patient denies respiratory symptoms of COVID 19 at this time. The importance of social distancing was discussed today.     ROS    Past Medical History:  Diagnosis Date   Abdominal pain, generalized    Abdominal pain, unspecified site    Acute cystitis    Acute sinusitis, unspecified    Anemia    Iron deficiency   Arm pain, left    Chest pain, unspecified    Common migraine    Complication of anesthesia    Cough     Depression    Dizziness and giddiness    Elbow pain    Elbow, forearm, and wrist, abrasion or friction burn, without mention of infection    Genital herpes, unspecified    GERD (gastroesophageal reflux disease)    Headache(784.0)    Heart murmur    Hyperlipidemia    Hypertension    IBS (irritable bowel syndrome)    Internal hemorrhoids without mention of complication    Knee pain, left    Microscopic hematuria    Osteoarthrosis, unspecified whether generalized or localized, unspecified site    Other malaise and fatigue    Other screening mammogram    Palpitations    Paresthesia    PONV (postoperative nausea and vomiting)    Routine general medical examination at a health care facility    Routine gynecological examination    Small bowel obstruction (HCC)    Urinary tract infection, site not specified     reports that she has never smoked. She has never used smokeless tobacco. She reports current alcohol use. She reports that she does not use drugs.   Current Outpatient Medications:    amoxicillin (AMOXIL) 500 MG tablet, 2g 1hr prior to dental procedure, Disp: 20 tablet, Rfl: 0   B Complex-C (SUPER B COMPLEX PO), Take 1 tablet by mouth daily., Disp: , Rfl:    Calcium Carbonate-Vitamin D (CALCIUM 600+D3 PO), Take  2 tablets by mouth daily., Disp: , Rfl:    Cholecalciferol (VITAMIN D-3) 1000 UNITS CAPS, Take 1 capsule by mouth daily., Disp: , Rfl:    Cholecalciferol (VITAMIN D3) 1.25 MG (50000 UT) CAPS, Take 1 capsule by mouth every 7 (seven) days., Disp: 12 capsule, Rfl: 0   hydrochlorothiazide (HYDRODIURIL) 25 MG tablet, TAKE 1 TABLET BY MOUTH  DAILY, Disp: 90 tablet, Rfl: 1   Magnesium 400 MG TABS, Take by mouth., Disp: , Rfl:    nystatin cream (MYCOSTATIN), Apply 1 application topically 2 (two) times daily., Disp: 30 g, Rfl: 0   pantoprazole (PROTONIX) 40 MG tablet, TAKE 1 TABLET BY MOUTH  DAILY, Disp: 90 tablet, Rfl: 3   simvastatin (ZOCOR)  40 MG tablet, TAKE 1 TABLET BY MOUTH AT  BEDTIME, Disp: 90 tablet, Rfl: 3   triamcinolone cream (KENALOG) 0.1 %, Apply 1 application topically 2 (two) times daily. Apply to affected area twice daily (rash), Disp: 15 g, Rfl: 0   venlafaxine XR (EFFEXOR-XR) 150 MG 24 hr capsule, TAKE 1 CAPSULE BY MOUTH  DAILY WITH BREAKFAST, Disp: 90 capsule, Rfl: 1   Observations/Objective: Temperature 98.2 F (36.8 C), temperature source Temporal, height 5' 6.73" (1.695 m), weight 221 lb 12 oz (100.6 kg).   MMSE: 29/30 14 in 30 sec animal recall Normal clock drawing   On venlafaxine 150 mg daily... helped a lot initially.... more tearful Physical Exam Constitutional:      General: She is not in acute distress.    Appearance: Normal appearance. She is well-developed. She is not ill-appearing or toxic-appearing.  HENT:     Head: Normocephalic.     Right Ear: Hearing, tympanic membrane, ear canal and external ear normal. Tympanic membrane is not erythematous, retracted or bulging.     Left Ear: Hearing, tympanic membrane, ear canal and external ear normal. Tympanic membrane is not erythematous, retracted or bulging.     Nose: No mucosal edema or rhinorrhea.     Right Sinus: No maxillary sinus tenderness or frontal sinus tenderness.     Left Sinus: No maxillary sinus tenderness or frontal sinus tenderness.     Mouth/Throat:     Pharynx: Uvula midline.  Eyes:     General: Lids are normal. Lids are everted, no foreign bodies appreciated.     Conjunctiva/sclera: Conjunctivae normal.     Pupils: Pupils are equal, round, and reactive to light.  Neck:     Thyroid: No thyroid mass or thyromegaly.     Vascular: No carotid bruit.     Trachea: Trachea normal.  Cardiovascular:     Rate and Rhythm: Normal rate and regular rhythm.     Pulses: Normal pulses.     Heart sounds: Normal heart sounds, S1 normal and S2 normal. No murmur heard.  No friction rub. No gallop.   Pulmonary:     Effort: Pulmonary effort  is normal. No tachypnea or respiratory distress.     Breath sounds: Normal breath sounds. No decreased breath sounds, wheezing, rhonchi or rales.  Abdominal:     General: Bowel sounds are normal.     Palpations: Abdomen is soft.     Tenderness: There is no abdominal tenderness.  Musculoskeletal:     Cervical back: Normal range of motion and neck supple. Crepitus present. Pain with movement and spinous process tenderness present. Normal range of motion.  Skin:    General: Skin is warm and dry.     Findings: No rash.  Neurological:  Mental Status: She is alert and oriented to person, place, and time.     GCS: GCS eye subscore is 4. GCS verbal subscore is 5. GCS motor subscore is 6.     Cranial Nerves: No cranial nerve deficit.     Sensory: No sensory deficit.     Motor: No abnormal muscle tone.     Coordination: Coordination normal.     Gait: Gait normal.     Deep Tendon Reflexes: Reflexes are normal and symmetric.     Comments: Nml cerebellar exam   No papilledema  Psychiatric:        Mood and Affect: Mood is not anxious or depressed.        Speech: Speech normal.        Behavior: Behavior normal. Behavior is cooperative.        Thought Content: Thought content normal.        Cognition and Memory: Memory is not impaired. She does not exhibit impaired recent memory or impaired remote memory.        Judgment: Judgment normal.      Assessment and Plan   Memory loss Recall issue, but very mild.   Likely due to mood issues and stress. No red flags.  Pt not currently interested in referral to neurologist, neurocognitive psychologist or depression med change.   Cervicogenic headache  Likely caused by neck pain. She is open to PT for pain iff needed.  Neck pain Eval with X-ray.  Major depressive disorder, recurrent episode, moderate (HCC)  Moderate control on venlafaxine.. refuse counselor referral at this point or med cahnge.     Kerby Nora, MD

## 2020-06-01 NOTE — Assessment & Plan Note (Signed)
Likely caused by neck pain. She is open to PT for pain iff needed.

## 2020-06-01 NOTE — Assessment & Plan Note (Signed)
Recall issue, but very mild.   Likely due to mood issues and stress. No red flags.  Pt not currently interested in referral to neurologist, neurocognitive psychologist or depression med change.

## 2020-06-01 NOTE — Patient Instructions (Addendum)
Work on stress reduction, relaxation. Things you enjoy.  Call if interested in psychology referral ... or further memory eval with neurocognitive psychologist or if interested in changing venlafaxine to a different med.   We will call with X-ray results and consider PT referral for cervicogenic headache.

## 2020-06-01 NOTE — Assessment & Plan Note (Signed)
Eval with X-ray. 

## 2020-06-01 NOTE — Assessment & Plan Note (Signed)
Moderate control on venlafaxine.. refuse counselor referral at this point or med cahnge.

## 2020-06-03 ENCOUNTER — Other Ambulatory Visit: Payer: Self-pay | Admitting: Family Medicine

## 2020-06-03 DIAGNOSIS — G4486 Cervicogenic headache: Secondary | ICD-10-CM

## 2020-06-03 DIAGNOSIS — M542 Cervicalgia: Secondary | ICD-10-CM

## 2020-06-29 ENCOUNTER — Other Ambulatory Visit: Payer: Self-pay

## 2020-06-29 ENCOUNTER — Ambulatory Visit: Payer: 59 | Admitting: Physical Therapy

## 2020-06-29 ENCOUNTER — Encounter: Payer: Self-pay | Admitting: Physical Therapy

## 2020-06-29 ENCOUNTER — Ambulatory Visit: Payer: 59 | Attending: Family Medicine | Admitting: Physical Therapy

## 2020-06-29 DIAGNOSIS — R293 Abnormal posture: Secondary | ICD-10-CM | POA: Diagnosis present

## 2020-06-29 DIAGNOSIS — G44209 Tension-type headache, unspecified, not intractable: Secondary | ICD-10-CM | POA: Insufficient documentation

## 2020-06-29 DIAGNOSIS — M542 Cervicalgia: Secondary | ICD-10-CM | POA: Insufficient documentation

## 2020-06-29 DIAGNOSIS — M5412 Radiculopathy, cervical region: Secondary | ICD-10-CM | POA: Insufficient documentation

## 2020-06-29 NOTE — Patient Instructions (Signed)
Access Code: [email or web address]: [email or web address]: 11/30/2021Prepared by: Victorino Dike PaaExercises  Supine Cervical Retraction with Towel - 2 x daily - 7 x weekly - 2 sets - 10 reps - 5-10 hold  Seated Scapular Retraction - 2 x daily - 7 x weekly - 2 sets - 10 reps - 5-10 hold  Seated Upper Trap Stretch - 2 x daily - 7 x weekly - 1 sets - 3 reps - 30 hold  Gentle Levator Scapulae Stretch - 2 x daily - 7 x weekly - 1 sets - 3 reps - 30 hold
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
## 2020-06-30 ENCOUNTER — Ambulatory Visit: Payer: 59 | Admitting: Physical Therapy

## 2020-07-08 ENCOUNTER — Ambulatory Visit: Payer: 59 | Attending: Family Medicine

## 2020-07-08 ENCOUNTER — Other Ambulatory Visit: Payer: Self-pay

## 2020-07-08 DIAGNOSIS — M542 Cervicalgia: Secondary | ICD-10-CM | POA: Insufficient documentation

## 2020-07-08 DIAGNOSIS — R293 Abnormal posture: Secondary | ICD-10-CM | POA: Diagnosis present

## 2020-07-08 DIAGNOSIS — G44209 Tension-type headache, unspecified, not intractable: Secondary | ICD-10-CM | POA: Insufficient documentation

## 2020-07-08 DIAGNOSIS — M5412 Radiculopathy, cervical region: Secondary | ICD-10-CM | POA: Diagnosis not present

## 2020-07-08 NOTE — Therapy (Signed)
[facility] [address] [place], [place], [address] Phone: [phone]   Fax:  [phone]  Physical Therapy Treatment  Patient Details  Name: Mia Kline MRN: [id number] Date of Birth: 12-15-1955 Referring Provider (PT): Dr. Kerby Nora    Encounter Date: 07/08/2020   PT End of Session - 07/08/20 0851    Visit Number 2    Number of Visits 16    Date for PT Re-Evaluation 08/23/20    Authorization Type UHC    PT Start Time 0800    PT Stop Time 0855    PT Time Calculation (min) 55 min    Activity Tolerance Patient tolerated treatment well    Behavior During Therapy [facility] for tasks assessed/performed           Past Medical History:  Diagnosis Date  . Abdominal pain, generalized   . Abdominal pain, unspecified site   . Acute cystitis   . Acute sinusitis, unspecified   . Anemia    Iron deficiency  . Arm pain, left   . Chest pain, unspecified   . Common migraine   . Complication of anesthesia   . Cough   . Depression   . Dizziness and giddiness   . Elbow pain   . Elbow, forearm, and wrist, abrasion or friction burn, without mention of infection   . Genital herpes, unspecified   . GERD (gastroesophageal reflux disease)   . Headache(784.0)   . Heart murmur   . Hyperlipidemia   . Hypertension   . IBS (irritable bowel syndrome)   . Internal hemorrhoids without mention of complication   . Knee pain, left   . Microscopic hematuria   . Osteoarthrosis, unspecified whether generalized or localized, unspecified site   . Other malaise and fatigue   . Other screening mammogram   . Palpitations   . Paresthesia   . PONV (postoperative nausea and vomiting)   . Routine general medical examination at a health care facility   . Routine gynecological examination   . Small bowel obstruction (HCC)   . Urinary tract infection, site not specified     Past Surgical History:  Procedure Laterality Date  . ABDOMINAL  HYSTERECTOMY    . Barium Follow Through  2007   because rectum twisted and couldn't do colonoscopy, was painful though  . barium swallow  2006   Hiatal hernia  . BILATERAL OOPHORECTOMY  2006   For large cyst  . CARDIOVASCULAR STRESS TEST  09/2009   Low risk  . PARTIAL HYSTERECTOMY  1998   Vaginal  . TOTAL KNEE ARTHROPLASTY Left 07/02/2017   Procedure: LEFT TOTAL KNEE ARTHROPLASTY;  Surgeon: Cammy Copa, MD;  Location: [facility] OR;  Service: Orthopedics;  Laterality: Left;  . TUBAL LIGATION      There were no vitals filed for this visit.   Subjective Assessment - 07/08/20 0801    Subjective headaches are "sort of kind of there everyday" along the top of her head. She reports being really sore in her neck after last session that lasted for a few days. She reports compliance with HEP.    Pertinent History migraines    Limitations House hold activities;Lifting    Diagnostic tests XR done:Multilevel degenerative changes in the cervical spine with moderateto severe osseous neuroforaminal narrowing at RIGHT C3-4, C4-5, C5-6and C6-    Patient Stated Goals would like pain relief, less headaches    Currently in Pain? Yes    Pain Score 5  Pain Location Head    Pain Orientation Posterior    Pain Descriptors / Indicators Shooting    Pain Type Chronic pain    Pain Onset More than a month ago    Pain Frequency Constant    Multiple Pain Sites No                             [facility] Adult PT Treatment/Exercise - 07/08/20 0001      Self-Care   Other Self-Care Comments  see patient education      Neck Exercises: Seated   Other Seated Exercise cervical retraction 2 x 10; scapular retraction 2 x 10      Moist Heat Therapy   Number Minutes Moist Heat 10 Minutes    Moist Heat Location Cervical      Manual Therapy   Manual therapy comments STM/DTM/TrP release to bilateral upper trap/levator scapulae      Neck Exercises: Stretches   Upper Trapezius Stretch Limitations 1 x  30 sec each    Levator Stretch Limitations 1 x 30 sec each                  PT Education - 07/08/20 4782    Education Details Review of current HEP. postural education. educaiton on DN indicaitons, side effects. Education on use of heat for pain control.    Person(s) Educated Patient    Methods Explanation;Demonstration;Tactile cues;Verbal cues;Handout    Comprehension Verbalized understanding;Returned demonstration;Need further instruction;Verbal cues required            PT Short Term Goals - 06/29/20 1740      PT SHORT TERM GOAL #1   Title Pt will be I with HEP for posture, cervical flexibility    Time 4    Period Weeks    Status New    Target Date 07/27/20      PT SHORT TERM GOAL #2   Title Pt will report min, temporary  relief of headache following exercises and PT session    Time 4    Period Weeks    Status New    Target Date 07/27/20      PT SHORT TERM GOAL #3   Title Pt will understand lifestyle factors that may contribute to her headaches and chronic pain    Time 4    Period Weeks    Status New    Target Date 07/27/20      PT SHORT TERM GOAL #4   Title Pt will understand FOTO resutlts and potentialto improve her condition.    Time 4    Period Weeks    Status New    Target Date 07/27/20             PT Long Term Goals - 06/29/20 1745      PT LONG TERM GOAL #1   Title Pt will be able to be independent with HEP for cervical spine, posture upon discharge    Time 8    Period Weeks    Status New    Target Date 08/24/20      PT LONG TERM GOAL #2   Title Pt will be able to report headaches as becoming more intermittent and mild with progression of PT    Time 8    Period Weeks    Status New    Target Date 08/24/20      PT LONG TERM GOAL #3   Title Pt will  be able to improve bilateral UE strength to 5/5 bilaterally to improve neck support with lifting    Time 8    Period Weeks    Status New    Target Date 08/24/20      PT LONG TERM GOAL #4    Title Pt will be able to report no arm symptoms 75% of the time ,pain centralizing in cervical spine    Time 8    Period Weeks    Status New    Target Date 08/24/20                 Plan - 07/08/20 0904    Clinical Impression Statement Significant tautness and palpable tenderness about bilateral upper traps and levator scapulae with partial release from manual therapy. Consider dry needling at future sessions if tautness remains. Patient has initial difficulty properly performing cervical retraction, though with continued practice and verbal/tactile cues patient able to properly perform. Patient requires moderate cues to decrease excessive upper trap engagement during postural correctives with ability to correct once cued.    Personal Factors and Comorbidities Comorbidity 3+;Time since onset of injury/illness/exacerbation    Comorbidities TKR, depression, HTN, Hyperlipids    Examination-Activity Limitations Lift;Reach Overhead;Carry    Examination-Participation Restrictions Interpersonal Relationship;Occupation;Cleaning;Community Activity;Driving    Stability/Clinical Decision Making Evolving/Moderate complexity    Rehab Potential Excellent    PT Treatment/Interventions ADLs/Self Care Home Management;Cryotherapy;Traction;Gait training;Therapeutic exercise;Patient/family education;Taping;Manual techniques;Passive range of motion;Dry needling;Spinal Manipulations;Neuromuscular re-education;Therapeutic activities;Moist Heat;Electrical Stimulation;Other (comment)   PNE   PT Next Visit Plan review HEP, manual therapy/dry needling to cervicals. progress postural correctives    PT Home Exercise Plan neck stretches, chin tuck and scap retraciton    Consulted and Agree with Plan of Care Patient           Patient will benefit from skilled therapeutic intervention in order to improve the following deficits and impairments:  Decreased mobility,Hypomobility,Obesity,Decreased range of  motion,Decreased strength,Increased fascial restricitons,Impaired flexibility,Impaired UE functional use,Postural dysfunction,Pain  Visit Diagnosis: Radiculopathy, cervical region  Cervicalgia  Abnormal posture  Tension-type headache, not intractable, unspecified chronicity pattern     Problem List Patient Active Problem List   Diagnosis Date Noted  . Cervicogenic headache 06/01/2020  . Neck pain 06/01/2020  . Intertrigo 05/18/2020  . Memory loss 05/18/2020  . Prediabetes 05/13/2019  . Arthritis of knee 07/02/2017  . Multiple joint pain 11/28/2016  . Low back pain with bilateral sciatica 11/28/2016  . Panic disorder without agoraphobia with moderate panic attacks 07/01/2015  . Tinnitus 07/01/2015  . Chronic insomnia 10/03/2013  . Plantar fasciitis, bilateral 03/28/2013  . Fibromyalgia 06/26/2011  . MICROSCOPIC HEMATURIA 04/15/2010  . PARESTHESIA 09/28/2009  . PALPITATIONS, CHRONIC 09/28/2009  . INTERNAL HEMORRHOIDS WITHOUT MENTION COMP 02/10/2009  . IBS 02/10/2009  . FATIGUE, CHRONIC 01/12/2009  . Chronic cough 10/02/2008  . GENITAL HERPES 02/12/2008  . Hyperlipidemia 02/12/2008  . Anemia, iron deficiency 02/12/2008  . Major depressive disorder, recurrent episode, moderate (HCC) 02/12/2008  . COMMON MIGRAINE 02/12/2008  . Essential hypertension, benign 02/12/2008  . GERD 02/12/2008  . OSTEOARTHRITIS 02/12/2008  . ABDOMINAL PAIN, CHRONIC 02/12/2008  . HEART MURMUR, HX OF 02/12/2008   Letitia Libra, PT, DPT, ATC 07/08/20 9:37 AM  [facility] [address] [place], [place], [address] Phone: [phone]   Fax:  [phone]  Name: Mia Kline MRN: [id number] Date of Birth: April 01, 1956

## 2020-07-20 ENCOUNTER — Ambulatory Visit (INDEPENDENT_AMBULATORY_CARE_PROVIDER_SITE_OTHER): Payer: 59 | Admitting: Family Medicine

## 2020-07-20 ENCOUNTER — Other Ambulatory Visit: Payer: Self-pay

## 2020-07-20 ENCOUNTER — Encounter: Payer: Self-pay | Admitting: Family Medicine

## 2020-07-20 VITALS — BP 130/96 | HR 80 | Temp 98.3°F | Ht 66.73 in | Wt 222.4 lb

## 2020-07-20 DIAGNOSIS — M542 Cervicalgia: Secondary | ICD-10-CM

## 2020-07-20 DIAGNOSIS — R413 Other amnesia: Secondary | ICD-10-CM

## 2020-07-20 DIAGNOSIS — G4486 Cervicogenic headache: Secondary | ICD-10-CM

## 2020-07-20 MED ORDER — FLUCONAZOLE 150 MG PO TABS: 150.0000 mg | ORAL_TABLET | Freq: Once | ORAL | 0 refills | Status: AC

## 2020-07-20 NOTE — Patient Instructions (Addendum)
If neck pain and headache recur.. start massage therapy.  Continue neck stretches at home. Start regular exercise for memory.  Call  If interested in neurology referral. Take diflucan for yeast and the use topical treatment as you are. Hold simvastatin when taking this medication.

## 2020-07-20 NOTE — Assessment & Plan Note (Signed)
Improved with PT and especially massage.  Continue home PT and rx for massage therapy given.

## 2020-07-20 NOTE — Assessment & Plan Note (Signed)
Resolved with treatment of neck pain.

## 2020-07-20 NOTE — Assessment & Plan Note (Signed)
Stable, chronic. Mild.  No clear secondary cause  Encouraged exercise, stress reduction, good sleep.  If worsening ,  She will let me know if she is interested in referral to neurology.

## 2020-07-20 NOTE — Progress Notes (Signed)
Patient ID: Mia Kline, female    DOB: [birth year], 64 y.o.   MRN: [id number]  This visit was conducted in person.  BP (!) 130/96 (BP Location: Left Arm, Patient Position: Sitting)   Pulse 80   Temp 98.3 F (36.8 C)   Ht 5' 6.73" (1.695 m)   Wt 222 lb 6.4 oz (100.9 kg)   SpO2 95%   BMI 35.12 kg/m    CC: memory loss Subjective:   HPI: Mia Kline is a 64 y.o. female presenting on 07/20/2020 for Memory Loss   She was last seen for the same issue on 06/01/2020  HPI as follows:  She reports she has noted decline in memory for several years... worse during COVID and with extra stress in last year-2. Was out of work for 6 months. Gradual change, not sudden.  No associated neuro changes.   Has started having issues with sense of time, forgetting where she puts things. Forgetting names... recall is an issue. Trouble focusing and paying attention.  When started back at work.. she was having issues functioning.  Coworkers have noted a change. Sleeping well at night...8 hours  Feels lack of motivation. No anhedonia.  Stress level is back to normal but memory is not. Has had issues in past with mood.  TSH, B12 normal and vit D was only slightly low, nml CMET. 04/2020    At last OV memory loss deemed to be mild... pt was not interested at that time in referral to neurologist, neurocognitive psychiatrist or depression med change.  She is tired but states she does not think her mood is an issue.   Also 1 month ago cervicogenic headache and neck pain discussed   11/2/2021X-ray of cervical spine: IMPRESSION: Multilevel degenerative changes in the cervical spine with moderate to severe osseous neuroforaminal narrowing at RIGHT C3-4, C4-5, C5-6 and C6-7. Referred to PT  At today's OV she reports:   She reports she went a couple times to PT.. taught home stretches.  Had massage... this helped a lot. She has less pain in neck. She has no had recent headache, Not bothering  her. She has not really been ding exercises.   She reports memory is stable, not worse but better.  Relevant past medical, surgical, family and social history reviewed and updated as indicated. Interim medical history since our last visit reviewed. Allergies and medications reviewed and updated. Outpatient Medications Prior to Visit  Medication Sig Dispense Refill  . amoxicillin (AMOXIL) 500 MG tablet 2g 1hr prior to dental procedure 20 tablet 0  . B Complex-C (SUPER B COMPLEX PO) Take 1 tablet by mouth daily.    . Calcium Carbonate-Vitamin D (CALCIUM 600+D3 PO) Take 2 tablets by mouth daily.    . Cholecalciferol (VITAMIN D-3) 1000 UNITS CAPS Take 1 capsule by mouth daily.    . Cholecalciferol (VITAMIN D3) 1.25 MG (50000 UT) CAPS Take 1 capsule by mouth every 7 (seven) days. 12 capsule 0  . hydrochlorothiazide (HYDRODIURIL) 25 MG tablet TAKE 1 TABLET BY MOUTH  DAILY 90 tablet 1  . Magnesium 400 MG TABS Take by mouth.    . nystatin cream (MYCOSTATIN) Apply 1 application topically 2 (two) times daily. 30 g 0  . pantoprazole (PROTONIX) 40 MG tablet TAKE 1 TABLET BY MOUTH  DAILY 90 tablet 3  . simvastatin (ZOCOR) 40 MG tablet TAKE 1 TABLET BY MOUTH AT  BEDTIME 90 tablet 3  . triamcinolone cream (KENALOG) 0.1 % Apply 1 application topically 2 (two)  times daily. Apply to affected area twice daily (rash) 15 g 0  . venlafaxine XR (EFFEXOR-XR) 150 MG 24 hr capsule TAKE 1 CAPSULE BY MOUTH  DAILY WITH BREAKFAST 90 capsule 1   No facility-administered medications prior to visit.     Per HPI unless specifically indicated in ROS section below Review of Systems  Constitutional: Negative for fatigue and fever.  HENT: Negative for ear pain.   Eyes: Negative for pain.  Respiratory: Negative for chest tightness and shortness of breath.   Cardiovascular: Negative for chest pain, palpitations and leg swelling.  Gastrointestinal: Negative for abdominal pain.  Genitourinary: Negative for dysuria.    Objective:  BP (!) 130/96 (BP Location: Left Arm, Patient Position: Sitting)   Pulse 80   Temp 98.3 F (36.8 C)   Ht 5' 6.73" (1.695 m)   Wt 222 lb 6.4 oz (100.9 kg)   SpO2 95%   BMI 35.12 kg/m   Wt Readings from Last 3 Encounters:  07/20/20 222 lb 6.4 oz (100.9 kg)  06/01/20 221 lb 12 oz (100.6 kg)  05/18/20 221 lb (100.2 kg)      Physical Exam Constitutional:      General: She is not in acute distress.Vital signs are normal.     Appearance: Normal appearance. She is well-developed and well-nourished. She is obese. She is not ill-appearing or toxic-appearing.  HENT:     Head: Normocephalic.     Right Ear: Hearing, tympanic membrane, ear canal and external ear normal. Tympanic membrane is not erythematous, retracted or bulging.     Left Ear: Hearing, tympanic membrane, ear canal and external ear normal. Tympanic membrane is not erythematous, retracted or bulging.     Nose: No mucosal edema or rhinorrhea.     Right Sinus: No maxillary sinus tenderness or frontal sinus tenderness.     Left Sinus: No maxillary sinus tenderness or frontal sinus tenderness.     Mouth/Throat:     Mouth: Oropharynx is clear and moist and mucous membranes are normal.     Pharynx: Uvula midline.  Eyes:     General: Lids are normal. Lids are everted, no foreign bodies appreciated.     Extraocular Movements: EOM normal.     Conjunctiva/sclera: Conjunctivae normal.     Pupils: Pupils are equal, round, and reactive to light.  Neck:     Thyroid: No thyroid mass or thyromegaly.     Vascular: No carotid bruit.     Trachea: Trachea normal.  Cardiovascular:     Rate and Rhythm: Normal rate and regular rhythm.     Pulses: Normal pulses and intact distal pulses.     Heart sounds: Normal heart sounds, S1 normal and S2 normal. No murmur heard. No friction rub. No gallop.   Pulmonary:     Effort: Pulmonary effort is normal. No tachypnea or respiratory distress.     Breath sounds: Normal breath sounds. No  decreased breath sounds, wheezing, rhonchi or rales.  Abdominal:     General: Bowel sounds are normal.     Palpations: Abdomen is soft.     Tenderness: There is no abdominal tenderness.  Musculoskeletal:     Cervical back: Normal range of motion and neck supple.  Skin:    General: Skin is warm, dry and intact.     Findings: No rash.  Neurological:     Mental Status: She is alert.  Psychiatric:        Mood and Affect: Mood is not anxious or depressed.  Speech: Speech normal.        Behavior: Behavior normal. Behavior is cooperative.        Thought Content: Thought content normal.        Cognition and Memory: Cognition and memory normal.        Judgment: Judgment normal.       Results for orders placed or performed in visit on 05/25/20  Fecal occult blood, imunochemical   Specimen: Stool  Result Value Ref Range   Fecal Occult Bld Negative Negative    This visit occurred during the SARS-CoV-2 public health emergency.  Safety protocols were in place, including screening questions prior to the visit, additional usage of staff PPE, and extensive cleaning of exam room while observing appropriate contact time as indicated for disinfecting solutions.   COVID 19 screen:  No recent travel or known exposure to COVID19 The patient denies respiratory symptoms of COVID 19 at this time. The importance of social distancing was discussed today.   Assessment and Plan    Problem List Items Addressed This Visit    Cervicogenic headache - Primary    Resolved with treatment of neck pain.      Memory loss    Stable, chronic. Mild.  No clear secondary cause  Encouraged exercise, stress reduction, good sleep.  If worsening ,  She will let me know if she is interested in referral to neurology.      Neck pain    Improved with PT and especially massage.  Continue home PT and rx for massage therapy given.          Kerby Nora, MD

## 2020-08-03 ENCOUNTER — Other Ambulatory Visit: Payer: Self-pay | Admitting: Family Medicine

## 2020-08-04 ENCOUNTER — Ambulatory Visit: Payer: 59

## 2020-08-05 ENCOUNTER — Other Ambulatory Visit: Payer: Self-pay | Admitting: Family Medicine

## 2020-08-06 ENCOUNTER — Ambulatory Visit: Payer: 59 | Attending: Family Medicine | Admitting: Physical Therapy

## 2020-08-06 ENCOUNTER — Other Ambulatory Visit: Payer: Self-pay

## 2020-08-06 ENCOUNTER — Encounter: Payer: Self-pay | Admitting: Physical Therapy

## 2020-08-06 DIAGNOSIS — R293 Abnormal posture: Secondary | ICD-10-CM | POA: Diagnosis present

## 2020-08-06 DIAGNOSIS — M542 Cervicalgia: Secondary | ICD-10-CM | POA: Insufficient documentation

## 2020-08-06 DIAGNOSIS — G44209 Tension-type headache, unspecified, not intractable: Secondary | ICD-10-CM | POA: Insufficient documentation

## 2020-08-06 DIAGNOSIS — M5412 Radiculopathy, cervical region: Secondary | ICD-10-CM | POA: Diagnosis present

## 2020-08-06 NOTE — Therapy (Signed)
[place], [place], [address] Phone: [phone]   Fax:  [phone]  Physical Therapy Treatment  Patient Details  Name: Mia Kline MRN: [id number] Date of Birth: 1955/10/04 Referring Provider (PT): Dr. Eliezer Lofts    Encounter Date: 08/06/2020   PT End of Session - 08/06/20 0852    Visit Number 3    Number of Visits 16    Date for PT Re-Evaluation 08/23/20    Authorization Type [employer]    PT Start Time 0845    PT Stop Time 0933    PT Time Calculation (min) 48 min           Past Medical History:  Diagnosis Date  . Abdominal pain, generalized   . Abdominal pain, unspecified site   . Acute cystitis   . Acute sinusitis, unspecified   . Anemia    Iron deficiency  . Arm pain, left   . Chest pain, unspecified   . Common migraine   . Complication of anesthesia   . Cough   . Depression   . Dizziness and giddiness   . Elbow pain   . Elbow, forearm, and wrist, abrasion or friction burn, without mention of infection   . Genital herpes, unspecified   . GERD (gastroesophageal reflux disease)   . Headache(784.0)   . Heart murmur   . Hyperlipidemia   . Hypertension   . IBS (irritable bowel syndrome)   . Internal hemorrhoids without mention of complication   . Knee pain, left   . Microscopic hematuria   . Osteoarthrosis, unspecified whether generalized or localized, unspecified site   . Other malaise and fatigue   . Other screening mammogram   . Palpitations   . Paresthesia   . PONV (postoperative nausea and vomiting)   . Routine general medical examination at a health care facility   . Routine gynecological examination   . Small bowel obstruction ([place])   . Urinary tract infection, site not specified     Past Surgical History:  Procedure Laterality Date  . ABDOMINAL HYSTERECTOMY    . Barium Follow Through  2007   because rectum twisted and couldn't do colonoscopy, was painful though  .  barium swallow  2006   Hiatal hernia  . BILATERAL OOPHORECTOMY  2006   For large cyst  . CARDIOVASCULAR STRESS TEST  09/2009   Low risk  . PARTIAL HYSTERECTOMY  1998   Vaginal  . TOTAL KNEE ARTHROPLASTY Left 07/02/2017   Procedure: LEFT TOTAL KNEE ARTHROPLASTY;  Surgeon: Meredith Pel, MD;  Location: [facility];  Service: Orthopedics;  Laterality: Left;  . TUBAL LIGATION      There were no vitals filed for this visit.   Subjective Assessment - 08/06/20 0839    Subjective Pt reports pain was much less after last session that included massage. She has a referral for massage therapy from her MD. She reports pain is coming back a little today but is mild.              [facility] PT Assessment - 08/06/20 0001      AROM   Cervical - Right Rotation WFL     Cervical - Left Rotation [facility]                          [facility] Adult PT Treatment/Exercise - 08/06/20 0001      Self-Care   Other Self-Care Comments  benefits of  postural activation HEP and benfits of stretching post manul therapy and TPDN      Neck Exercises: Seated   Other Seated Exercise cervical retraction 2 x 10; scapular retraction 2 x 10      Moist Heat Therapy   Number Minutes Moist Heat 10 Minutes    Moist Heat Location Cervical   seated     Manual Therapy   Manual therapy comments STM/DTM/TrP release to bilateral upper trap/levator scapulae, cervical paraspinals   seated     Neck Exercises: Stretches   Upper Trapezius Stretch Limitations 3 x 30 sec    Levator Stretch Limitations 3 x 30 sec                  PT Education - 08/06/20 0925    Education Details FOTO    Person(s) Educated Patient    Methods Explanation    Comprehension Verbalized understanding            PT Short Term Goals - 08/06/20 0924      PT SHORT TERM GOAL #1   Title Pt will be I with HEP for posture, cervical flexibility    Baseline non compliant    Time 4    Period Weeks    Status On-going    Target Date  07/27/20      PT SHORT TERM GOAL #2   Title Pt will report min, temporary  relief of headache following exercises and PT session    Baseline felt great after last session, less intenseity and frequency of headaches    Period Weeks    Status Partially Met      PT SHORT TERM GOAL #3   Title Pt will understand lifestyle factors that may contribute to her headaches and chronic pain    Baseline ongoing education on posure and selfcare    Time 4    Period Weeks    Status On-going      PT SHORT TERM GOAL #4   Title Pt will understand FOTO resutlts and potentialto improve her condition.    Baseline discussed FOTO and predictions    Time 4    Period Weeks    Status Achieved    Target Date 07/27/20             PT Long Term Goals - 06/29/20 1745      PT LONG TERM GOAL #1   Title Pt will be able to be independent with HEP for cervical spine, posture upon discharge    Time 8    Period Weeks    Status New    Target Date 08/24/20      PT LONG TERM GOAL #2   Title Pt will be able to report headaches as becoming more intermittent and mild with progression of PT    Time 8    Period Weeks    Status New    Target Date 08/24/20      PT LONG TERM GOAL #3   Title Pt will be able to improve bilateral UE strength to 5/5 bilaterally to improve neck support with lifting    Time 8    Period Weeks    Status New    Target Date 08/24/20      PT LONG TERM GOAL #4   Title Pt will be able to report no arm symptoms 75% of the time ,pain centralizing in cervical spine    Time 8    Period Weeks    Status New  Target Date 08/24/20                 Plan - 08/06/20 0850    Clinical Impression Statement Pt reports headaches reduced by 30%. Headaches are more intermittent and not as intense. Pt reports significant decrease in radicular pain. She still has pain with heavy lifting. She has not performed HEP much and reports she does not feel like the exercises do anything. Education provided  on the benefits of postural activation and maintaining alignment to promote healing and decrease strain. Reviewed HEP and performed manual soft tissue work to neck and upper traps seated, folowed by [place]. Pt is progrssing toward goals. She is agreeable to try TPDN.    PT Next Visit Plan review HEP, manual therapy/dry needling to cervicals. progress postural correctives    PT Home Exercise Plan neck stretches, chin tuck and scap retraciton           Patient will benefit from skilled therapeutic intervention in order to improve the following deficits and impairments:  Decreased mobility,Hypomobility,Obesity,Decreased range of motion,Decreased strength,Increased fascial restricitons,Impaired flexibility,Impaired UE functional use,Postural dysfunction,Pain  Visit Diagnosis: Radiculopathy, cervical region  Cervicalgia  Tension-type headache, not intractable, unspecified chronicity pattern     Problem List Patient Active Problem List   Diagnosis Date Noted  . Cervicogenic headache 06/01/2020  . Neck pain 06/01/2020  . Intertrigo 05/18/2020  . Memory loss 05/18/2020  . Prediabetes 05/13/2019  . Arthritis of knee 07/02/2017  . Multiple joint pain 11/28/2016  . Low back pain with bilateral sciatica 11/28/2016  . Panic disorder without agoraphobia with moderate panic attacks 07/01/2015  . Tinnitus 07/01/2015  . Chronic insomnia 10/03/2013  . Plantar fasciitis, bilateral 03/28/2013  . Fibromyalgia 06/26/2011  . MICROSCOPIC HEMATURIA 04/15/2010  . PARESTHESIA 09/28/2009  . PALPITATIONS, CHRONIC 09/28/2009  . INTERNAL HEMORRHOIDS WITHOUT MENTION COMP 02/10/2009  . IBS 02/10/2009  . FATIGUE, CHRONIC 01/12/2009  . Chronic cough 10/02/2008  . GENITAL HERPES 02/12/2008  . Hyperlipidemia 02/12/2008  . Anemia, iron deficiency 02/12/2008  . Major depressive disorder, recurrent episode, moderate ([place]) 02/12/2008  . COMMON MIGRAINE 02/12/2008  . Essential hypertension, benign 02/12/2008  .  GERD 02/12/2008  . OSTEOARTHRITIS 02/12/2008  . ABDOMINAL PAIN, CHRONIC 02/12/2008  . HEART MURMUR, HX OF 02/12/2008    Dorene Ar, PTA 08/06/2020, 9:27 AM  [facility] [address] [place], [place], [address] Phone: [phone]   Fax:  [phone]  Name: Greidys Deland MRN: [id number] Date of Birth: 09/09/1955

## 2020-08-11 ENCOUNTER — Ambulatory Visit: Payer: 59 | Admitting: Physical Therapy

## 2020-08-13 ENCOUNTER — Ambulatory Visit: Payer: 59 | Admitting: Physical Therapy

## 2020-08-18 ENCOUNTER — Ambulatory Visit: Payer: 59 | Admitting: Physical Therapy

## 2020-08-18 ENCOUNTER — Other Ambulatory Visit: Payer: Self-pay

## 2020-08-18 ENCOUNTER — Encounter: Payer: Self-pay | Admitting: Physical Therapy

## 2020-08-18 DIAGNOSIS — G44209 Tension-type headache, unspecified, not intractable: Secondary | ICD-10-CM

## 2020-08-18 DIAGNOSIS — M5412 Radiculopathy, cervical region: Secondary | ICD-10-CM | POA: Diagnosis not present

## 2020-08-18 DIAGNOSIS — M542 Cervicalgia: Secondary | ICD-10-CM

## 2020-08-18 DIAGNOSIS — R293 Abnormal posture: Secondary | ICD-10-CM

## 2020-08-18 NOTE — Patient Instructions (Signed)
Access Code: [email or web address]   URL: [email or web address]: 01/19/2022Prepared by: Victorino Dike PaaExercises  Seated Scapular Retraction - 2 x daily - 7 x weekly - 2 sets - 10 reps - 5-10 hold  Seated Upper Trap Stretch - 2 x daily - 7 x weekly - 1 sets - 3 reps - 30 hold  Gentle Levator Scapulae Stretch - 2 x daily - 7 x weekly - 1 sets - 3 reps - 30 hold  Seated Cervical Retraction - 2 x daily - 7 x weekly - 2 sets - 10 reps - 5 sec hold  Supine Shoulder Horizontal Abduction with Resistance - 2 x daily - 7 x weekly - 2 sets - 10 reps - 5 hold

## 2020-08-18 NOTE — Therapy (Signed)
[place], [place], [address] Phone: [phone]   Fax:  [phone]  Physical Therapy Treatment  Patient Details  Name: Mia Kline MRN: [id number] Date of Birth: 1956/05/12 Referring Provider (PT): Dr. Eliezer Lofts    Encounter Date: 08/18/2020   PT End of Session - 08/18/20 0848    Visit Number 4    Number of Visits 16    Date for PT Re-Evaluation 08/23/20    Authorization Type UHC    PT Start Time 0845   pt late   PT Stop Time 0920    PT Time Calculation (min) 35 min    Activity Tolerance Patient tolerated treatment well    Behavior During Therapy [facility] for tasks assessed/performed           Past Medical History:  Diagnosis Date  . Abdominal pain, generalized   . Abdominal pain, unspecified site   . Acute cystitis   . Acute sinusitis, unspecified   . Anemia    Iron deficiency  . Arm pain, left   . Chest pain, unspecified   . Common migraine   . Complication of anesthesia   . Cough   . Depression   . Dizziness and giddiness   . Elbow pain   . Elbow, forearm, and wrist, abrasion or friction burn, without mention of infection   . Genital herpes, unspecified   . GERD (gastroesophageal reflux disease)   . Headache(784.0)   . Heart murmur   . Hyperlipidemia   . Hypertension   . IBS (irritable bowel syndrome)   . Internal hemorrhoids without mention of complication   . Knee pain, left   . Microscopic hematuria   . Osteoarthrosis, unspecified whether generalized or localized, unspecified site   . Other malaise and fatigue   . Other screening mammogram   . Palpitations   . Paresthesia   . PONV (postoperative nausea and vomiting)   . Routine general medical examination at a health care facility   . Routine gynecological examination   . Small bowel obstruction ([place])   . Urinary tract infection, site not specified     Past Surgical History:  Procedure Laterality Date  . ABDOMINAL  HYSTERECTOMY    . Barium Follow Through  2007   because rectum twisted and couldn't do colonoscopy, was painful though  . barium swallow  2006   Hiatal hernia  . BILATERAL OOPHORECTOMY  2006   For large cyst  . CARDIOVASCULAR STRESS TEST  09/2009   Low risk  . PARTIAL HYSTERECTOMY  1998   Vaginal  . TOTAL KNEE ARTHROPLASTY Left 07/02/2017   Procedure: LEFT TOTAL KNEE ARTHROPLASTY;  Surgeon: Meredith Pel, MD;  Location: [place];  Service: Orthopedics;  Laterality: Left;  . TUBAL LIGATION      There were no vitals filed for this visit.   Subjective Assessment - 08/18/20 0844    Subjective I'm crazy.  Had some issues with her cat this AM.  She has a headache.  Neck is cracking and popping when I move it. Massage helps the most    Currently in Pain? Yes    Pain Score 3     Pain Location Neck              [facility] Adult PT Treatment/Exercise - 08/18/20 0001      Self-Care   Other Self-Care Comments  dry needling, HEP and posture reinforcement      Neck Exercises: Seated   Other  Seated Exercise cervical retraction 2 x 10; scapular retraction 2 x 10      Shoulder Exercises: Supine   Horizontal ABduction 10 reps    Theraband Level (Shoulder Horizontal ABduction) Level 2 (Red)    Horizontal ABduction Weight (lbs) x 2 sets    Other Supine Exercises chin tuck x 10 into towel roll      Shoulder Exercises: ROM/Strengthening   UBE (Upper Arm Bike) 4 min in reverse L1      Manual Therapy   Soft tissue mobilization posterior cervicals, uppoer trap and levator scap , multiple trigger points, painful but well tolerated    Passive ROM lateral flexion    Manual Traction suboccipital release      Neck Exercises: Stretches   Upper Trapezius Stretch 3 reps;20 seconds    Levator Stretch 3 reps;20 seconds                    PT Short Term Goals - 08/06/20 0924      PT SHORT TERM GOAL #1   Title Pt will be I with HEP for posture, cervical flexibility    Baseline non  compliant    Time 4    Period Weeks    Status On-going    Target Date 07/27/20      PT SHORT TERM GOAL #2   Title Pt will report min, temporary  relief of headache following exercises and PT session    Baseline felt great after last session, less intenseity and frequency of headaches    Period Weeks    Status Partially Met      PT SHORT TERM GOAL #3   Title Pt will understand lifestyle factors that may contribute to her headaches and chronic pain    Baseline ongoing education on posure and selfcare    Time 4    Period Weeks    Status On-going      PT SHORT TERM GOAL #4   Title Pt will understand FOTO resutlts and potentialto improve her condition.    Baseline discussed FOTO and predictions    Time 4    Period Weeks    Status Achieved    Target Date 07/27/20             PT Long Term Goals - 06/29/20 1745      PT LONG TERM GOAL #1   Title Pt will be able to be independent with HEP for cervical spine, posture upon discharge    Time 8    Period Weeks    Status New    Target Date 08/24/20      PT LONG TERM GOAL #2   Title Pt will be able to report headaches as becoming more intermittent and mild with progression of PT    Time 8    Period Weeks    Status New    Target Date 08/24/20      PT LONG TERM GOAL #3   Title Pt will be able to improve bilateral UE strength to 5/5 bilaterally to improve neck support with lifting    Time 8    Period Weeks    Status New    Target Date 08/24/20      PT LONG TERM GOAL #4   Title Pt will be able to report no arm symptoms 75% of the time ,pain centralizing in cervical spine    Time 8    Period Weeks    Status New    Target Date  08/24/20                 Plan - 08/18/20 0846    Clinical Impression Statement Pt was late today. She reports improvement with manual therapy (prior visits and today. Discussed places she could go for therapeutic massage. Interested in dry needling going forward, would like to schedule visits  as she had to miss 2 last week.    PT Treatment/Interventions ADLs/Self Care Home Management;Cryotherapy;Traction;Gait training;Therapeutic exercise;Patient/family education;Taping;Manual techniques;Passive range of motion;Dry needling;Spinal Manipulations;Neuromuscular re-education;Therapeutic activities;Moist Heat;Electrical Stimulation;Other (comment)    PT Next Visit Plan try standing bands on wall for posture/ HEP, manual therapy/dry needling to cervicals. progress postural correctives    PT Home Exercise Plan [email or web address]: neck stretches, red band horizontal abd    Consulted and Agree with Plan of Care Patient           Patient will benefit from skilled therapeutic intervention in order to improve the following deficits and impairments:  Decreased mobility,Hypomobility,Obesity,Decreased range of motion,Decreased strength,Increased fascial restricitons,Impaired flexibility,Impaired UE functional use,Postural dysfunction,Pain  Visit Diagnosis: Radiculopathy, cervical region  Cervicalgia  Tension-type headache, not intractable, unspecified chronicity pattern  Abnormal posture     Problem List Patient Active Problem List   Diagnosis Date Noted  . Cervicogenic headache 06/01/2020  . Neck pain 06/01/2020  . Intertrigo 05/18/2020  . Memory loss 05/18/2020  . Prediabetes 05/13/2019  . Arthritis of knee 07/02/2017  . Multiple joint pain 11/28/2016  . Low back pain with bilateral sciatica 11/28/2016  . Panic disorder without agoraphobia with moderate panic attacks 07/01/2015  . Tinnitus 07/01/2015  . Chronic insomnia 10/03/2013  . Plantar fasciitis, bilateral 03/28/2013  . Fibromyalgia 06/26/2011  . MICROSCOPIC HEMATURIA 04/15/2010  . PARESTHESIA 09/28/2009  . PALPITATIONS, CHRONIC 09/28/2009  . INTERNAL HEMORRHOIDS WITHOUT MENTION COMP 02/10/2009  . IBS 02/10/2009  . FATIGUE, CHRONIC 01/12/2009  . Chronic cough 10/02/2008  . GENITAL HERPES 02/12/2008  . Hyperlipidemia  02/12/2008  . Anemia, iron deficiency 02/12/2008  . Major depressive disorder, recurrent episode, moderate ([place]) 02/12/2008  . COMMON MIGRAINE 02/12/2008  . Essential hypertension, benign 02/12/2008  . GERD 02/12/2008  . OSTEOARTHRITIS 02/12/2008  . ABDOMINAL PAIN, CHRONIC 02/12/2008  . HEART MURMUR, HX OF 02/12/2008    PAA,JENNIFER 08/18/2020, 9:31 AM  [facility] [address] [place], [place], [address] Phone: [phone]   Fax:  [phone]  Name: Mia Kline MRN: [id number] Date of Birth: [birth year]

## 2020-08-20 ENCOUNTER — Ambulatory Visit: Payer: 59 | Admitting: Physical Therapy

## 2020-08-24 ENCOUNTER — Other Ambulatory Visit: Payer: Self-pay

## 2020-08-24 ENCOUNTER — Ambulatory Visit: Payer: 59 | Admitting: Physical Therapy

## 2020-08-24 ENCOUNTER — Encounter: Payer: Self-pay | Admitting: Physical Therapy

## 2020-08-24 DIAGNOSIS — M5412 Radiculopathy, cervical region: Secondary | ICD-10-CM

## 2020-08-24 DIAGNOSIS — R293 Abnormal posture: Secondary | ICD-10-CM

## 2020-08-24 DIAGNOSIS — M542 Cervicalgia: Secondary | ICD-10-CM

## 2020-08-24 DIAGNOSIS — G44209 Tension-type headache, unspecified, not intractable: Secondary | ICD-10-CM

## 2020-08-24 NOTE — Therapy (Signed)
[place], [place], [address] Phone: [phone]   Fax:  [phone]  Physical Therapy Treatment / Re-Certification  Patient Details  Name: Mia Kline MRN: [id number] Date of Birth: 05/24/1956 Referring Provider (PT): Dr. Eliezer Lofts    Encounter Date: 08/24/2020   PT End of Session - 08/24/20 0807    Visit Number 5    Number of Visits 16    Date for PT Re-Evaluation 10/05/20    Authorization Type UHC    PT Start Time 0805   pt arrived late   PT Stop Time 0845    PT Time Calculation (min) 40 min    Activity Tolerance Patient tolerated treatment well    Behavior During Therapy [facility] for tasks assessed/performed           Past Medical History:  Diagnosis Date  . Abdominal pain, generalized   . Abdominal pain, unspecified site   . Acute cystitis   . Acute sinusitis, unspecified   . Anemia    Iron deficiency  . Arm pain, left   . Chest pain, unspecified   . Common migraine   . Complication of anesthesia   . Cough   . Depression   . Dizziness and giddiness   . Elbow pain   . Elbow, forearm, and wrist, abrasion or friction burn, without mention of infection   . Genital herpes, unspecified   . GERD (gastroesophageal reflux disease)   . Headache(784.0)   . Heart murmur   . Hyperlipidemia   . Hypertension   . IBS (irritable bowel syndrome)   . Internal hemorrhoids without mention of complication   . Knee pain, left   . Microscopic hematuria   . Osteoarthrosis, unspecified whether generalized or localized, unspecified site   . Other malaise and fatigue   . Other screening mammogram   . Palpitations   . Paresthesia   . PONV (postoperative nausea and vomiting)   . Routine general medical examination at a health care facility   . Routine gynecological examination   . Small bowel obstruction ([place])   . Urinary tract infection, site not specified     Past Surgical History:  Procedure  Laterality Date  . ABDOMINAL HYSTERECTOMY    . Barium Follow Through  2007   because rectum twisted and couldn't do colonoscopy, was painful though  . barium swallow  2006   Hiatal hernia  . BILATERAL OOPHORECTOMY  2006   For large cyst  . CARDIOVASCULAR STRESS TEST  09/2009   Low risk  . PARTIAL HYSTERECTOMY  1998   Vaginal  . TOTAL KNEE ARTHROPLASTY Left 07/02/2017   Procedure: LEFT TOTAL KNEE ARTHROPLASTY;  Surgeon: Meredith Pel, MD;  Location: [place];  Service: Orthopedics;  Laterality: Left;  . TUBAL LIGATION      There were no vitals filed for this visit.   Subjective Assessment - 08/24/20 0809    Subjective "I am doing about the same, pain is about a 4/10 on both sides of the neck."    Currently in Pain? Yes    Pain Score 4     Pain Location Neck    Pain Orientation Right;Left;Posterior    Pain Descriptors / Indicators Aching;Sore    Pain Type Chronic pain    Pain Onset More than a month ago    Pain Frequency Intermittent    Aggravating Factors  unsure    Pain Relieving Factors laying down and relax  [facility] PT Assessment - 08/24/20 0001      Assessment   Medical Diagnosis cervicogenic headache, neck pain     Referring Provider (PT) Dr. Eliezer Lofts       Observation/Other Assessments   Focus on Therapeutic Outcomes (FOTO)  47% function                         [facility] Adult PT Treatment/Exercise - 08/24/20 0001      Neck Exercises: Seated   Other Seated Exercise cervical retraction 2 x 10   cues to avoid escessive motion     Shoulder Exercises: Supine   Other Supine Exercises money 2 x 10 with green band      Manual Therapy   Manual therapy comments skilled palpation and monitoring of pt throughout TPDN    Soft tissue mobilization bil first rib mobs grade III, T1-T8 grade III PA    Manual Traction MTPR for upper trap      Neck Exercises: Stretches   Upper Trapezius Stretch Right;Left;30 seconds;2 reps    Levator Stretch 2  reps;Left;Right;30 seconds            Trigger Point Dry Needling - 08/24/20 0001    Consent Given? Yes    Education Handout Provided Yes    Muscles Treated Head and Neck Suboccipitals    Suboccipitals Response Twitch response elicited;Palpable increased muscle length   bil               PT Education - 08/24/20 0825    Education Details muscle anatomy and referall patterns, What TPDN is, benefits and what to expect.    Person(s) Educated Patient    Methods Explanation;Verbal cues;Handout    Comprehension Verbalized understanding;Verbal cues required            PT Short Term Goals - 08/24/20 0815      PT SHORT TERM GOAL #1   Title Pt will be I with HEP for posture, cervical flexibility    Baseline non compliant    Period Weeks    Status On-going      PT SHORT TERM GOAL #2   Title Pt will report min, temporary  relief of headache following exercises and PT session    Baseline felt better follow exercise    Period Weeks    Status Achieved      PT SHORT TERM GOAL #3   Title Pt will understand lifestyle factors that may contribute to her headaches and chronic pain    Period Weeks    Status On-going      PT SHORT TERM GOAL #4   Title Pt will understand FOTO resutlts and potentialto improve her condition.    Period Weeks    Status Achieved             PT Long Term Goals - 08/24/20 0817      PT LONG TERM GOAL #1   Title Pt will be able to be independent with HEP for cervical spine, posture upon discharge    Period Weeks    Status On-going    Target Date 10/05/20      PT LONG TERM GOAL #2   Title Pt will be able to report headaches as becoming more intermittent and mild with progression of PT    Period Weeks    Status On-going    Target Date 10/05/20      PT LONG TERM GOAL #3   Title Pt will  be able to improve bilateral UE strength to 5/5 bilaterally to improve neck support with lifting    Period Weeks    Status On-going    Target Date 10/05/20       PT LONG TERM GOAL #4   Title Pt will be able to report no arm symptoms 75% of the time ,pain centralizing in cervical spine    Baseline notings reduced UE referred symptoms 50% better    Status Partially Met                 Plan - 08/24/20 0836    Clinical Impression Statement pt arrives noting limited change in her symptoms since the last session. educated and consent was provided for TPDN for bil sub-occipitals followed with IASTM techniques. continued working on stretching and posterior chain strengthening to promote posture. End of session she reported decreased stiffness/ pain. She has made limited progress toward her goals and would benefit from continued physical therapy to decrease neck pain, reduce HA frequency, and promote posture.    Examination-Participation Restrictions Interpersonal Relationship;Occupation;Cleaning;Community Activity;Driving    PT Frequency 2x / week    PT Duration 6 weeks    PT Treatment/Interventions ADLs/Self Care Home Management;Cryotherapy;Traction;Gait training;Therapeutic exercise;Patient/family education;Taping;Manual techniques;Passive range of motion;Dry needling;Spinal Manipulations;Neuromuscular re-education;Therapeutic activities;Moist Heat;Electrical Stimulation;Other (comment)    PT Next Visit Plan try standing bands on wall for posture/ HEP, manual therapy/dry needling to cervicals. progress postural correctives, response to DN    PT Home Exercise Plan [id number]: neck stretches, red band horizontal abd           Patient will benefit from skilled therapeutic intervention in order to improve the following deficits and impairments:  Decreased mobility,Hypomobility,Obesity,Decreased range of motion,Decreased strength,Increased fascial restricitons,Impaired flexibility,Impaired UE functional use,Postural dysfunction,Pain  Visit Diagnosis: Radiculopathy, cervical region  Cervicalgia  Tension-type headache, not intractable, unspecified  chronicity pattern  Abnormal posture     Problem List Patient Active Problem List   Diagnosis Date Noted  . Cervicogenic headache 06/01/2020  . Neck pain 06/01/2020  . Intertrigo 05/18/2020  . Memory loss 05/18/2020  . Prediabetes 05/13/2019  . Arthritis of knee 07/02/2017  . Multiple joint pain 11/28/2016  . Low back pain with bilateral sciatica 11/28/2016  . Panic disorder without agoraphobia with moderate panic attacks 07/01/2015  . Tinnitus 07/01/2015  . Chronic insomnia 10/03/2013  . Plantar fasciitis, bilateral 03/28/2013  . Fibromyalgia 06/26/2011  . MICROSCOPIC HEMATURIA 04/15/2010  . PARESTHESIA 09/28/2009  . PALPITATIONS, CHRONIC 09/28/2009  . INTERNAL HEMORRHOIDS WITHOUT MENTION COMP 02/10/2009  . IBS 02/10/2009  . FATIGUE, CHRONIC 01/12/2009  . Chronic cough 10/02/2008  . GENITAL HERPES 02/12/2008  . Hyperlipidemia 02/12/2008  . Anemia, iron deficiency 02/12/2008  . Major depressive disorder, recurrent episode, moderate ([place]) 02/12/2008  . COMMON MIGRAINE 02/12/2008  . Essential hypertension, benign 02/12/2008  . GERD 02/12/2008  . OSTEOARTHRITIS 02/12/2008  . ABDOMINAL PAIN, CHRONIC 02/12/2008  . HEART MURMUR, HX OF 02/12/2008   Starr Lake PT, DPT, LAT, ATC  08/24/20  9:10 AM      [place] [facility] [address] [place], [place], [address] Phone: [phone]   Fax:  [phone]  Name: Charlynn Salih MRN: [id number] Date of Birth: 04-20-1956

## 2020-09-01 ENCOUNTER — Ambulatory Visit: Payer: 59 | Attending: Family Medicine | Admitting: Physical Therapy

## 2020-09-01 ENCOUNTER — Encounter: Payer: Self-pay | Admitting: Physical Therapy

## 2020-09-01 ENCOUNTER — Other Ambulatory Visit: Payer: Self-pay

## 2020-09-01 DIAGNOSIS — M5412 Radiculopathy, cervical region: Secondary | ICD-10-CM

## 2020-09-01 DIAGNOSIS — G44209 Tension-type headache, unspecified, not intractable: Secondary | ICD-10-CM | POA: Diagnosis present

## 2020-09-01 DIAGNOSIS — M542 Cervicalgia: Secondary | ICD-10-CM | POA: Insufficient documentation

## 2020-09-01 DIAGNOSIS — R293 Abnormal posture: Secondary | ICD-10-CM

## 2020-09-01 NOTE — Therapy (Signed)
[place], [place], [address] Phone: [phone]   Fax:  [phone]  Physical Therapy Treatment  Patient Details  Name: Mia Kline MRN: [id number] Date of Birth: January 08, 1956 Referring Provider (PT): Dr. Eliezer Lofts    Encounter Date: 09/01/2020   PT End of Session - 09/01/20 0848    Visit Number 6    Number of Visits 16    Date for PT Re-Evaluation 10/05/20    Authorization Type [place] at 10th visit    PT Start Time 0847    Activity Tolerance Patient tolerated treatment well    Behavior During Therapy [facility] for tasks assessed/performed           Past Medical History:  Diagnosis Date  . Abdominal pain, generalized   . Abdominal pain, unspecified site   . Acute cystitis   . Acute sinusitis, unspecified   . Anemia    Iron deficiency  . Arm pain, left   . Chest pain, unspecified   . Common migraine   . Complication of anesthesia   . Cough   . Depression   . Dizziness and giddiness   . Elbow pain   . Elbow, forearm, and wrist, abrasion or friction burn, without mention of infection   . Genital herpes, unspecified   . GERD (gastroesophageal reflux disease)   . Headache(784.0)   . Heart murmur   . Hyperlipidemia   . Hypertension   . IBS (irritable bowel syndrome)   . Internal hemorrhoids without mention of complication   . Knee pain, left   . Microscopic hematuria   . Osteoarthrosis, unspecified whether generalized or localized, unspecified site   . Other malaise and fatigue   . Other screening mammogram   . Palpitations   . Paresthesia   . PONV (postoperative nausea and vomiting)   . Routine general medical examination at a health care facility   . Routine gynecological examination   . Small bowel obstruction ([place])   . Urinary tract infection, site not specified     Past Surgical History:  Procedure Laterality Date  . ABDOMINAL HYSTERECTOMY    . Barium Follow Through  2007    because rectum twisted and couldn't do colonoscopy, was painful though  . barium swallow  2006   Hiatal hernia  . BILATERAL OOPHORECTOMY  2006   For large cyst  . CARDIOVASCULAR STRESS TEST  09/2009   Low risk  . PARTIAL HYSTERECTOMY  1998   Vaginal  . TOTAL KNEE ARTHROPLASTY Left 07/02/2017   Procedure: LEFT TOTAL KNEE ARTHROPLASTY;  Surgeon: Meredith Pel, MD;  Location: [place];  Service: Orthopedics;  Laterality: Left;  . TUBAL LIGATION      There were no vitals filed for this visit.   Subjective Assessment - 09/01/20 0849    Subjective "I am feeling a little stiff this morning. The DN helped I didn't feel as much stiffness after."    Currently in Pain? Yes    Pain Score 2     Pain Location Neck    Pain Orientation Right;Left;Posterior    Pain Type Chronic pain    Pain Onset More than a month ago    Pain Frequency Intermittent    Aggravating Factors  unsure    Pain Relieving Factors laying down, exercise              [facility] PT Assessment - 09/01/20 0001      Assessment   Medical Diagnosis cervicogenic  headache, neck pain     Referring Provider (PT) Dr. Eliezer Lofts                          [facility] Adult PT Treatment/Exercise - 09/01/20 0001      Neck Exercises: Theraband   Rows 20 reps;Green   seated on dyna-disc to promote posture, x 1   Horizontal ABduction Red;20 reps   x 1 seated on dyna disc to promote posture   Other Theraband Exercises double ER with scapular retraction 1 x 20 with green band   seated on dyna disc to promote posture     Neck Exercises: Seated   Neck Retraction 10 reps;5 secs   verbal cues for proper form/ demstration   Neck Retraction Limitations while seated on dyna disc maintaining anterior pelvic tilt      Lumbar Exercises: Seated   Other Seated Lumbar Exercises anterior pelvic tilt 1 x 5 holding 5 seconds   to promote efficient sitting posture     Shoulder Exercises: ROM/Strengthening   UBE (Upper Arm Bike) L4 x 4 min    fwd/bwd x 2 min ea.     Manual Therapy   Manual Therapy Joint mobilization    Manual therapy comments skilled palpation and monitoring of pt throughout TPDN    Joint Mobilization bil first rib mobs grade III, T1-T8 grade III PA    Soft tissue mobilization IASTM for bil upper trap/ levator scapulae      Neck Exercises: Stretches   Upper Trapezius Stretch Left;Right;1 rep;30 seconds    Levator Stretch 1 rep;30 seconds;Left;Right            Trigger Point Dry Needling - 09/01/20 0001    Consent Given? Yes    Education Handout Provided Yes    Muscles Treated Head and Neck Cervical multifidi;Upper trapezius    Upper Trapezius Response Twitch reponse elicited;Palpable increased muscle length   L only   Suboccipitals Response Twitch response elicited;Palpable increased muscle length    Cervical multifidi Response Twitch reponse elicited;Palpable increased muscle length   bil C6               PT Education - 09/01/20 0929    Education Details reviewed sitting/ standing posture and benefits to reduce abnormal tension.    Person(s) Educated Patient    Methods Explanation;Verbal cues;Handout    Comprehension Verbalized understanding;Verbal cues required            PT Short Term Goals - 08/24/20 0815      PT SHORT TERM GOAL #1   Title Pt will be I with HEP for posture, cervical flexibility    Baseline non compliant    Period Weeks    Status On-going      PT SHORT TERM GOAL #2   Title Pt will report min, temporary  relief of headache following exercises and PT session    Baseline felt better follow exercise    Period Weeks    Status Achieved      PT SHORT TERM GOAL #3   Title Pt will understand lifestyle factors that may contribute to her headaches and chronic pain    Period Weeks    Status On-going      PT SHORT TERM GOAL #4   Title Pt will understand FOTO resutlts and potentialto improve her condition.    Period Weeks    Status Achieved  PT Long  Term Goals - 08/24/20 0817      PT LONG TERM GOAL #1   Title Pt will be able to be independent with HEP for cervical spine, posture upon discharge    Period Weeks    Status On-going    Target Date 10/05/20      PT LONG TERM GOAL #2   Title Pt will be able to report headaches as becoming more intermittent and mild with progression of PT    Period Weeks    Status On-going    Target Date 10/05/20      PT LONG TERM GOAL #3   Title Pt will be able to improve bilateral UE strength to 5/5 bilaterally to improve neck support with lifting    Period Weeks    Status On-going    Target Date 10/05/20      PT LONG TERM GOAL #4   Title Pt will be able to report no arm symptoms 75% of the time ,pain centralizing in cervical spine    Baseline notings reduced UE referred symptoms 50% better    Status Partially Met                 Plan - 09/01/20 0915    Clinical Impression Statement pt reports improvement since the previous session reporting decreaed stiffness and pain to 2/10 today. continued TPDN focus in bil sub-occipitals, cervical multifidi, and L upper trap followed with IASTM techiques and thoracic mobs. strengthening to promote efficient posture and posterior chain strengtheinng,utilized dyna disc to promote anterior pelvic tilt.    PT Treatment/Interventions ADLs/Self Care Home Management;Cryotherapy;Traction;Gait training;Therapeutic exercise;Patient/family education;Taping;Manual techniques;Passive range of motion;Dry needling;Spinal Manipulations;Neuromuscular re-education;Therapeutic activities;Moist Heat;Electrical Stimulation;Other (comment)    PT Next Visit Plan try standing bands on wall for posture/ HEP, manual therapy/dry needling to cervicals. progress postural correctives, response to DN    PT Home Exercise Plan [id number]: neck stretches, red band horizontal abd           Patient will benefit from skilled therapeutic intervention in order to improve the following  deficits and impairments:  Decreased mobility,Hypomobility,Obesity,Decreased range of motion,Decreased strength,Increased fascial restricitons,Impaired flexibility,Impaired UE functional use,Postural dysfunction,Pain  Visit Diagnosis: Radiculopathy, cervical region  Cervicalgia  Tension-type headache, not intractable, unspecified chronicity pattern  Abnormal posture     Problem List Patient Active Problem List   Diagnosis Date Noted  . Cervicogenic headache 06/01/2020  . Neck pain 06/01/2020  . Intertrigo 05/18/2020  . Memory loss 05/18/2020  . Prediabetes 05/13/2019  . Arthritis of knee 07/02/2017  . Multiple joint pain 11/28/2016  . Low back pain with bilateral sciatica 11/28/2016  . Panic disorder without agoraphobia with moderate panic attacks 07/01/2015  . Tinnitus 07/01/2015  . Chronic insomnia 10/03/2013  . Plantar fasciitis, bilateral 03/28/2013  . Fibromyalgia 06/26/2011  . MICROSCOPIC HEMATURIA 04/15/2010  . PARESTHESIA 09/28/2009  . PALPITATIONS, CHRONIC 09/28/2009  . INTERNAL HEMORRHOIDS WITHOUT MENTION COMP 02/10/2009  . IBS 02/10/2009  . FATIGUE, CHRONIC 01/12/2009  . Chronic cough 10/02/2008  . GENITAL HERPES 02/12/2008  . Hyperlipidemia 02/12/2008  . Anemia, iron deficiency 02/12/2008  . Major depressive disorder, recurrent episode, moderate ([place]) 02/12/2008  . COMMON MIGRAINE 02/12/2008  . Essential hypertension, benign 02/12/2008  . GERD 02/12/2008  . OSTEOARTHRITIS 02/12/2008  . ABDOMINAL PAIN, CHRONIC 02/12/2008  . HEART MURMUR, HX OF 02/12/2008    Starr Lake PT, DPT, LAT, ATC  09/01/20  9:33 AM      [facility]  [place], [place], [address] Phone: [phone]   Fax:  [phone]  Name: Mia Kline MRN: [id number] Date of Birth: 1955/08/27

## 2020-09-01 NOTE — Patient Instructions (Signed)

## 2020-09-03 ENCOUNTER — Other Ambulatory Visit: Payer: Self-pay

## 2020-09-03 ENCOUNTER — Encounter: Payer: Self-pay | Admitting: Physical Therapy

## 2020-09-03 ENCOUNTER — Ambulatory Visit: Payer: 59 | Admitting: Physical Therapy

## 2020-09-03 DIAGNOSIS — M5412 Radiculopathy, cervical region: Secondary | ICD-10-CM

## 2020-09-03 DIAGNOSIS — M542 Cervicalgia: Secondary | ICD-10-CM

## 2020-09-03 DIAGNOSIS — G44209 Tension-type headache, unspecified, not intractable: Secondary | ICD-10-CM

## 2020-09-03 DIAGNOSIS — R293 Abnormal posture: Secondary | ICD-10-CM

## 2020-09-03 NOTE — Therapy (Signed)
[place], [place], [address] Phone: [phone]   Fax:  [phone]  Physical Therapy Treatment  Patient Details  Name: Mia Kline MRN: [id number] Date of Birth: [birth year] Referring Provider (PT): Dr. Eliezer Lofts    Encounter Date: 09/03/2020   PT End of Session - 09/03/20 0922    Visit Number 7    Number of Visits 16    Date for PT Re-Evaluation 10/05/20    Authorization Type [place] at 10th visit    PT Start Time 0918    PT Stop Time 1008    PT Time Calculation (min) 50 min    Activity Tolerance Patient tolerated treatment well    Behavior During Therapy [facility] for tasks assessed/performed           Past Medical History:  Diagnosis Date  . Abdominal pain, generalized   . Abdominal pain, unspecified site   . Acute cystitis   . Acute sinusitis, unspecified   . Anemia    Iron deficiency  . Arm pain, left   . Chest pain, unspecified   . Common migraine   . Complication of anesthesia   . Cough   . Depression   . Dizziness and giddiness   . Elbow pain   . Elbow, forearm, and wrist, abrasion or friction burn, without mention of infection   . Genital herpes, unspecified   . GERD (gastroesophageal reflux disease)   . Headache(784.0)   . Heart murmur   . Hyperlipidemia   . Hypertension   . IBS (irritable bowel syndrome)   . Internal hemorrhoids without mention of complication   . Knee pain, left   . Microscopic hematuria   . Osteoarthrosis, unspecified whether generalized or localized, unspecified site   . Other malaise and fatigue   . Other screening mammogram   . Palpitations   . Paresthesia   . PONV (postoperative nausea and vomiting)   . Routine general medical examination at a health care facility   . Routine gynecological examination   . Small bowel obstruction ([place])   . Urinary tract infection, site not specified     Past Surgical History:  Procedure Laterality Date  .  ABDOMINAL HYSTERECTOMY    . Barium Follow Through  2007   because rectum twisted and couldn't do colonoscopy, was painful though  . barium swallow  2006   Hiatal hernia  . BILATERAL OOPHORECTOMY  2006   For large cyst  . CARDIOVASCULAR STRESS TEST  09/2009   Low risk  . PARTIAL HYSTERECTOMY  1998   Vaginal  . TOTAL KNEE ARTHROPLASTY Left 07/02/2017   Procedure: LEFT TOTAL KNEE ARTHROPLASTY;  Surgeon: Meredith Pel, MD;  Location: [place];  Service: Orthopedics;  Laterality: Left;  . TUBAL LIGATION      There were no vitals filed for this visit.   Subjective Assessment - 09/03/20 0920    Subjective Has some neck pain today, feels like a pinched nerve.  Its giving me a headache.  Prior to today, her neck has been doing well.  No reason that she can recall.    Currently in Pain? Yes    Pain Score 5     Pain Location Neck    Pain Orientation Right;Left    Pain Descriptors / Indicators Shooting;Sore    Pain Type Chronic pain    Pain Frequency Intermittent    Aggravating Factors  depending how I move    Pain Relieving Factors not  sure , resting    Multiple Pain Sites No              [facility] Adult PT Treatment/Exercise - 09/03/20 0001      Neck Exercises: Seated   Neck Retraction 10 reps;5 secs    Cervical Rotation Both;5 reps    Lateral Flexion Both;5 reps    Lateral Flexion Limitations cues for hand isolating    Money 15 reps;5 secs    Money Limitations green band      Shoulder Exercises: Supine   Horizontal ABduction Strengthening;Both;15 reps    Theraband Level (Shoulder Horizontal ABduction) Level 2 (Red)    Flexion Strengthening;Both;10 reps    Shoulder Flexion Weight (lbs) supine on ball for neck relaxation    Other Supine Exercises cane flexion bilateral with head press x 15      Shoulder Exercises: Standing   Extension Strengthening;Both;20 reps    Theraband Level (Shoulder Extension) Level 3 (Green)    Row Both;20 reps    Theraband Level (Shoulder Row)  Level 3 (Green)      Shoulder Exercises: ROM/Strengthening   Nustep UE and LE L5 for 6 min      Moist Heat Therapy   Number Minutes Moist Heat 10 Minutes    Moist Heat Location Cervical      Manual Therapy   Manual Traction gentle suboccipital release with light friction                    PT Short Term Goals - 09/03/20 0944      PT SHORT TERM GOAL #1   Title Pt will be I with HEP for posture, cervical flexibility    Baseline needed cues today    Status On-going      PT SHORT TERM GOAL #2   Title Pt will report min, temporary  relief of headache following exercises and PT session    Status Achieved      PT SHORT TERM GOAL #3   Title Pt will understand lifestyle factors that may contribute to her headaches and chronic pain    Baseline ongoing education on posure and selfcare    Status On-going      PT SHORT TERM GOAL #4   Title Pt will understand FOTO resutlts and potentialto improve her condition.    Baseline discussed FOTO and predictions    Status Achieved             PT Long Term Goals - 08/24/20 0817      PT LONG TERM GOAL #1   Title Pt will be able to be independent with HEP for cervical spine, posture upon discharge    Period Weeks    Status On-going    Target Date 10/05/20      PT LONG TERM GOAL #2   Title Pt will be able to report headaches as becoming more intermittent and mild with progression of PT    Period Weeks    Status On-going    Target Date 10/05/20      PT LONG TERM GOAL #3   Title Pt will be able to improve bilateral UE strength to 5/5 bilaterally to improve neck support with lifting    Period Weeks    Status On-going    Target Date 10/05/20      PT LONG TERM GOAL #4   Title Pt will be able to report no arm symptoms 75% of the time ,pain centralizing in cervical spine  Baseline notings reduced UE referred symptoms 50% better    Status Partially Met                 Plan - 09/03/20 0946    Clinical Impression  Statement Pt with possible rebound pain from previous visit of dry needling.  Seems to have been doing her exercises but does rely on the paper to do them.  She has significant tension along occipitals, difficulty with sidebending.  Progress and build up strength and endurance for periscapular mm, cervical spine mobility.    PT Treatment/Interventions ADLs/Self Care Home Management;Cryotherapy;Traction;Gait training;Therapeutic exercise;Patient/family education;Taping;Manual techniques;Passive range of motion;Dry needling;Spinal Manipulations;Neuromuscular re-education;Therapeutic activities;Moist Heat;Electrical Stimulation;Other (comment)    PT Next Visit Plan try standing bands on wall for posture/ HEP, manual therapy/dry needling to cervicals. progress postural correctives, response to DN    PT Home Exercise Plan [email or web address]: neck stretches, red band horizontal abd, green row and ext    Consulted and Agree with Plan of Care Patient           Patient will benefit from skilled therapeutic intervention in order to improve the following deficits and impairments:  Decreased mobility,Hypomobility,Obesity,Decreased range of motion,Decreased strength,Increased fascial restricitons,Impaired flexibility,Impaired UE functional use,Postural dysfunction,Pain  Visit Diagnosis: Radiculopathy, cervical region  Cervicalgia  Tension-type headache, not intractable, unspecified chronicity pattern  Abnormal posture     Problem List Patient Active Problem List   Diagnosis Date Noted  . Cervicogenic headache 06/01/2020  . Neck pain 06/01/2020  . Intertrigo 05/18/2020  . Memory loss 05/18/2020  . Prediabetes 05/13/2019  . Arthritis of knee 07/02/2017  . Multiple joint pain 11/28/2016  . Low back pain with bilateral sciatica 11/28/2016  . Panic disorder without agoraphobia with moderate panic attacks 07/01/2015  . Tinnitus 07/01/2015  . Chronic insomnia 10/03/2013  . Plantar fasciitis, bilateral  03/28/2013  . Fibromyalgia 06/26/2011  . MICROSCOPIC HEMATURIA 04/15/2010  . PARESTHESIA 09/28/2009  . PALPITATIONS, CHRONIC 09/28/2009  . INTERNAL HEMORRHOIDS WITHOUT MENTION COMP 02/10/2009  . IBS 02/10/2009  . FATIGUE, CHRONIC 01/12/2009  . Chronic cough 10/02/2008  . GENITAL HERPES 02/12/2008  . Hyperlipidemia 02/12/2008  . Anemia, iron deficiency 02/12/2008  . Major depressive disorder, recurrent episode, moderate ([place]) 02/12/2008  . COMMON MIGRAINE 02/12/2008  . Essential hypertension, benign 02/12/2008  . GERD 02/12/2008  . OSTEOARTHRITIS 02/12/2008  . ABDOMINAL PAIN, CHRONIC 02/12/2008  . HEART MURMUR, HX OF 02/12/2008    Mia Kline 09/03/2020, 12:07 PM  [facility] [address] [place], [place], [address] Phone: [phone]   Fax:  [phone]  Name: Mia Kline MRN: [id number] Date of Birth: [birth year]   Mia Kline, PT 09/03/20 12:07 PM Phone: [phone] Fax: [phone]

## 2020-09-07 ENCOUNTER — Encounter: Payer: Self-pay | Admitting: Physical Therapy

## 2020-09-07 ENCOUNTER — Ambulatory Visit: Payer: 59 | Admitting: Physical Therapy

## 2020-09-07 ENCOUNTER — Other Ambulatory Visit: Payer: Self-pay

## 2020-09-07 DIAGNOSIS — G44209 Tension-type headache, unspecified, not intractable: Secondary | ICD-10-CM

## 2020-09-07 DIAGNOSIS — M5412 Radiculopathy, cervical region: Secondary | ICD-10-CM

## 2020-09-07 DIAGNOSIS — M542 Cervicalgia: Secondary | ICD-10-CM

## 2020-09-07 DIAGNOSIS — R293 Abnormal posture: Secondary | ICD-10-CM

## 2020-09-07 NOTE — Therapy (Signed)
[place], [place], [address] Phone: [phone]   Fax:  [phone]  Physical Therapy Treatment  Patient Details  Name: Mia Kline MRN: [id number] Date of Birth: 01-08-1956 Referring Provider (PT): Dr. Eliezer Lofts    Encounter Date: 09/07/2020   PT End of Session - 09/07/20 1241    Visit Number 8    Number of Visits 16    Date for PT Re-Evaluation 10/05/20    Authorization Type [place] at 10th visit    PT Start Time 1235   pt late 18 min   PT Stop Time 1316    PT Time Calculation (min) 41 min           Past Medical History:  Diagnosis Date  . Abdominal pain, generalized   . Abdominal pain, unspecified site   . Acute cystitis   . Acute sinusitis, unspecified   . Anemia    Iron deficiency  . Arm pain, left   . Chest pain, unspecified   . Common migraine   . Complication of anesthesia   . Cough   . Depression   . Dizziness and giddiness   . Elbow pain   . Elbow, forearm, and wrist, abrasion or friction burn, without mention of infection   . Genital herpes, unspecified   . GERD (gastroesophageal reflux disease)   . Headache(784.0)   . Heart murmur   . Hyperlipidemia   . Hypertension   . IBS (irritable bowel syndrome)   . Internal hemorrhoids without mention of complication   . Knee pain, left   . Microscopic hematuria   . Osteoarthrosis, unspecified whether generalized or localized, unspecified site   . Other malaise and fatigue   . Other screening mammogram   . Palpitations   . Paresthesia   . PONV (postoperative nausea and vomiting)   . Routine general medical examination at a health care facility   . Routine gynecological examination   . Small bowel obstruction ([place])   . Urinary tract infection, site not specified     Past Surgical History:  Procedure Laterality Date  . ABDOMINAL HYSTERECTOMY    . Barium Follow Through  2007   because rectum twisted and couldn't do  colonoscopy, was painful though  . barium swallow  2006   Hiatal hernia  . BILATERAL OOPHORECTOMY  2006   For large cyst  . CARDIOVASCULAR STRESS TEST  09/2009   Low risk  . PARTIAL HYSTERECTOMY  1998   Vaginal  . TOTAL KNEE ARTHROPLASTY Left 07/02/2017   Procedure: LEFT TOTAL KNEE ARTHROPLASTY;  Surgeon: Meredith Pel, MD;  Location: [facility];  Service: Orthopedics;  Laterality: Left;  . TUBAL LIGATION      There were no vitals filed for this visit.   Subjective Assessment - 09/07/20 1238    Subjective Felt really bad after last session, weird, foggy, painful.  Pt arrives late to session, was rushed and neck is really sore. Everyone could tell something was wrong with me.    Currently in Pain? Yes    Pain Score 3     Pain Location Neck              [facility] Adult PT Treatment/Exercise - 09/07/20 0001      Neck Exercises: Standing   Neck Retraction 5 reps      Neck Exercises: Seated   Other Seated Exercise snags with towel x 5 each side, upper trap stretching with towel as anchor  Neck Exercises: Supine   Neck Retraction 10 reps    Neck Retraction Limitations neutral , rotation each side x 10 each , used ball to release    Cervical Rotation Limitations supine head on ball x 10 , no pain      Shoulder Exercises: Standing   Horizontal ABduction Both;10 reps    Shoulder Flexion Weight (lbs) overhead lift with chin tuck on blue ball x 10      Manual Therapy   Manual Traction used towel for light traction with rotation and lateral flexion                    PT Short Term Goals - 09/03/20 0944      PT SHORT TERM GOAL #1   Title Pt will be I with HEP for posture, cervical flexibility    Baseline needed cues today    Status On-going      PT SHORT TERM GOAL #2   Title Pt will report min, temporary  relief of headache following exercises and PT session    Status Achieved      PT SHORT TERM GOAL #3   Title Pt will understand lifestyle factors that may  contribute to her headaches and chronic pain    Baseline ongoing education on posure and selfcare    Status On-going      PT SHORT TERM GOAL #4   Title Pt will understand FOTO resutlts and potentialto improve her condition.    Baseline discussed FOTO and predictions    Status Achieved             PT Long Term Goals - 08/24/20 0817      PT LONG TERM GOAL #1   Title Pt will be able to be independent with HEP for cervical spine, posture upon discharge    Period Weeks    Status On-going    Target Date 10/05/20      PT LONG TERM GOAL #2   Title Pt will be able to report headaches as becoming more intermittent and mild with progression of PT    Period Weeks    Status On-going    Target Date 10/05/20      PT LONG TERM GOAL #3   Title Pt will be able to improve bilateral UE strength to 5/5 bilaterally to improve neck support with lifting    Period Weeks    Status On-going    Target Date 10/05/20      PT LONG TERM GOAL #4   Title Pt will be able to report no arm symptoms 75% of the time ,pain centralizing in cervical spine    Baseline notings reduced UE referred symptoms 50% better    Status Partially Met                 Plan - 09/07/20 1251    Clinical Impression Statement Pt was late today but able to work for the remainder of the session.  Used towel to facilitate cervical rotation and lateral flexion.  Wall proved effective for postural strengthening.    PT Treatment/Interventions ADLs/Self Care Home Management;Cryotherapy;Traction;Gait training;Therapeutic exercise;Patient/family education;Taping;Manual techniques;Passive range of motion;Dry needling;Spinal Manipulations;Neuromuscular re-education;Therapeutic activities;Moist Heat;Electrical Stimulation;Other (comment)    PT Next Visit Plan try standing bands on wall for posture/ HEP, manual therapy/dry needling to cervicals. progress postural correctives, response to DN    PT Home Exercise Plan [id number]: neck  stretches, red band horizontal abd, green row and ext, asst rotation (snags)  Consulted and Agree with Plan of Care Patient           Patient will benefit from skilled therapeutic intervention in order to improve the following deficits and impairments:  Decreased mobility,Hypomobility,Obesity,Decreased range of motion,Decreased strength,Increased fascial restricitons,Impaired flexibility,Impaired UE functional use,Postural dysfunction,Pain  Visit Diagnosis: Radiculopathy, cervical region  Cervicalgia  Tension-type headache, not intractable, unspecified chronicity pattern  Abnormal posture     Problem List Patient Active Problem List   Diagnosis Date Noted  . Cervicogenic headache 06/01/2020  . Neck pain 06/01/2020  . Intertrigo 05/18/2020  . Memory loss 05/18/2020  . Prediabetes 05/13/2019  . Arthritis of knee 07/02/2017  . Multiple joint pain 11/28/2016  . Low back pain with bilateral sciatica 11/28/2016  . Panic disorder without agoraphobia with moderate panic attacks 07/01/2015  . Tinnitus 07/01/2015  . Chronic insomnia 10/03/2013  . Plantar fasciitis, bilateral 03/28/2013  . Fibromyalgia 06/26/2011  . MICROSCOPIC HEMATURIA 04/15/2010  . PARESTHESIA 09/28/2009  . PALPITATIONS, CHRONIC 09/28/2009  . INTERNAL HEMORRHOIDS WITHOUT MENTION COMP 02/10/2009  . IBS 02/10/2009  . FATIGUE, CHRONIC 01/12/2009  . Chronic cough 10/02/2008  . GENITAL HERPES 02/12/2008  . Hyperlipidemia 02/12/2008  . Anemia, iron deficiency 02/12/2008  . Major depressive disorder, recurrent episode, moderate ([place]) 02/12/2008  . COMMON MIGRAINE 02/12/2008  . Essential hypertension, benign 02/12/2008  . GERD 02/12/2008  . OSTEOARTHRITIS 02/12/2008  . ABDOMINAL PAIN, CHRONIC 02/12/2008  . HEART MURMUR, HX OF 02/12/2008    PAA,JENNIFER 09/07/2020, 3:35 PM  [facility] [address] [place], [place], [address] Phone: [phone]   Fax:   [phone]  Name: Beena Catano MRN: [id number] Date of Birth: [birth year]  Raeford Razor, PT 09/07/20 3:35 PM Phone: [phone] Fax: [phone]

## 2020-09-14 ENCOUNTER — Other Ambulatory Visit: Payer: Self-pay

## 2020-09-14 ENCOUNTER — Ambulatory Visit: Payer: 59 | Admitting: Physical Therapy

## 2020-09-14 DIAGNOSIS — R293 Abnormal posture: Secondary | ICD-10-CM

## 2020-09-14 DIAGNOSIS — M5412 Radiculopathy, cervical region: Secondary | ICD-10-CM

## 2020-09-14 DIAGNOSIS — G44209 Tension-type headache, unspecified, not intractable: Secondary | ICD-10-CM

## 2020-09-14 DIAGNOSIS — M542 Cervicalgia: Secondary | ICD-10-CM

## 2020-09-14 NOTE — Therapy (Signed)
[place], [place], [address] Phone: [phone]   Fax:  [phone]  Physical Therapy Treatment  Patient Details  Name: Mia Kline MRN: [id number] Date of Birth: [birth year] Referring Provider (PT): Dr. Eliezer Lofts    Encounter Date: 09/14/2020   PT End of Session - 09/14/20 1224    Visit Number 9    Number of Visits 16    Date for PT Re-Evaluation 10/05/20    Authorization Type [place] at 10th visit    PT Start Time 1223    PT Stop Time 1304    PT Time Calculation (min) 41 min    Activity Tolerance Patient tolerated treatment well    Behavior During Therapy [facility] for tasks assessed/performed           Past Medical History:  Diagnosis Date  . Abdominal pain, generalized   . Abdominal pain, unspecified site   . Acute cystitis   . Acute sinusitis, unspecified   . Anemia    Iron deficiency  . Arm pain, left   . Chest pain, unspecified   . Common migraine   . Complication of anesthesia   . Cough   . Depression   . Dizziness and giddiness   . Elbow pain   . Elbow, forearm, and wrist, abrasion or friction burn, without mention of infection   . Genital herpes, unspecified   . GERD (gastroesophageal reflux disease)   . Headache(784.0)   . Heart murmur   . Hyperlipidemia   . Hypertension   . IBS (irritable bowel syndrome)   . Internal hemorrhoids without mention of complication   . Knee pain, left   . Microscopic hematuria   . Osteoarthrosis, unspecified whether generalized or localized, unspecified site   . Other malaise and fatigue   . Other screening mammogram   . Palpitations   . Paresthesia   . PONV (postoperative nausea and vomiting)   . Routine general medical examination at a health care facility   . Routine gynecological examination   . Small bowel obstruction ([place])   . Urinary tract infection, site not specified     Past Surgical History:  Procedure Laterality Date  .  ABDOMINAL HYSTERECTOMY    . Barium Follow Through  2007   because rectum twisted and couldn't do colonoscopy, was painful though  . barium swallow  2006   Hiatal hernia  . BILATERAL OOPHORECTOMY  2006   For large cyst  . CARDIOVASCULAR STRESS TEST  09/2009   Low risk  . PARTIAL HYSTERECTOMY  1998   Vaginal  . TOTAL KNEE ARTHROPLASTY Left 07/02/2017   Procedure: LEFT TOTAL KNEE ARTHROPLASTY;  Surgeon: Meredith Pel, MD;  Location: [facility];  Service: Orthopedics;  Laterality: Left;  . TUBAL LIGATION      There were no vitals filed for this visit.   Subjective Assessment - 09/14/20 1226    Subjective Sharp headache today,  5/10.  The headaches are happening more often.  Usually everyday for years then they stopped and now they are back. Pt is complaining of poor memory and focus and attention. Pt    Diagnostic tests XR done:Multilevel degenerative changes in the cervical spine with moderateto severe osseous neuroforaminal narrowing at RIGHT C3-4, C4-5, C5-6and C6-    Patient Stated Goals would like pain relief, less headaches    Currently in Pain? Yes    Pain Score 5     Pain Location Head    Pain  Orientation Left;Lateral    Pain Descriptors / Indicators Shooting;Sharp    Pain Type Chronic pain    Pain Onset More than a month ago    Pain Frequency Intermittent                  [facility] Adult PT Treatment/Exercise - 09/14/20 0001      Neck Exercises: Supine   Neck Retraction 10 reps    Cervical Rotation Limitations supine head on ball x 10 , no pain      Shoulder Exercises: Standing   Horizontal ABduction Both;10 reps    Horizontal ABduction Weight (lbs) bent elbow no wgt.    Shoulder Flexion Weight (lbs) overhead press 3 lbs alternating arms x 16    Other Standing Exercises front raise, lateral raise x 10      Shoulder Exercises: ROM/Strengthening   UBE (Upper Arm Bike) 4 min in reverse L 1      Neck Exercises: Stretches   Upper Trapezius Stretch 4 reps;20 seconds     Levator Stretch 3 reps;20 seconds    Other Neck Stretches towel stretching not successful today                  PT Education - 09/14/20 1639    Education Details POC, stretching, instructions to see MD, Neuro, shoulder strength    Person(s) Educated Patient    Methods Explanation;Demonstration    Comprehension Verbalized understanding;Returned demonstration            PT Short Term Goals - 09/14/20 1243      PT SHORT TERM GOAL #1   Title Pt will be I with HEP for posture, cervical flexibility    Status On-going      PT SHORT TERM GOAL #2   Title Pt will report min, temporary  relief of headache following exercises and PT session    Baseline feels better but lately her headaches have returned.    Status On-going      PT SHORT TERM GOAL #3   Title Pt will understand lifestyle factors that may contribute to her headaches and chronic pain    Status On-going      PT SHORT TERM GOAL #4   Title Pt will understand FOTO resutlts and potentialto improve her condition.    Status Achieved             PT Long Term Goals - 09/14/20 1244      PT LONG TERM GOAL #1   Title Pt will be able to be independent with HEP for cervical spine, posture upon discharge    Status On-going      PT LONG TERM GOAL #2   Title Pt will be able to report headaches as becoming more intermittent and mild with progression of PT    Baseline was met, now increase in frequency    Status On-going      PT LONG TERM GOAL #3   Title Pt will be able to improve bilateral UE strength to 5/5 bilaterally to improve neck support with lifting    Status On-going      PT LONG TERM GOAL #4   Title Pt will be able to report no arm symptoms 75% of the time ,pain centralizing in cervical spine    Status Achieved                 Plan - 09/14/20 1222    Clinical Impression Statement Patientwith increased intensity of headaches lately.  Neck pain and arm pain nearly resolved. She reports limited  ability to remember her stretches (among many other things) and poor mental focus.  Discussed the plan to try and schedule 1-2 more appts before the end of POC , including dry needling.  She has neared her potential for rehab at this time but we may be abel to impact her headaches with 2 sessions of manual therapy.  Note to Dr. Diona Browner requesting referral for Neurocognitive testing.    PT Treatment/Interventions ADLs/Self Care Home Management;Cryotherapy;Traction;Gait training;Therapeutic exercise;Patient/family education;Taping;Manual techniques;Passive range of motion;Dry needling;Spinal Manipulations;Neuromuscular re-education;Therapeutic activities;Moist Heat;Electrical Stimulation;Other (comment)    PT Next Visit Plan try standing bands on wall for posture/ HEP, manual therapy/dry needling to cervicals. progress postural correctives, response to DN    PT Home Exercise Plan [id number]: neck stretches, red band horizontal abd, green row and ext, asst rotation (snags)    Consulted and Agree with Plan of Care Patient           Patient will benefit from skilled therapeutic intervention in order to improve the following deficits and impairments:  Decreased mobility,Hypomobility,Obesity,Decreased range of motion,Decreased strength,Increased fascial restricitons,Impaired flexibility,Impaired UE functional use,Postural dysfunction,Pain  Visit Diagnosis: Radiculopathy, cervical region  Cervicalgia  Tension-type headache, not intractable, unspecified chronicity pattern  Abnormal posture     Problem List Patient Active Problem List   Diagnosis Date Noted  . Cervicogenic headache 06/01/2020  . Neck pain 06/01/2020  . Intertrigo 05/18/2020  . Memory loss 05/18/2020  . Prediabetes 05/13/2019  . Arthritis of knee 07/02/2017  . Multiple joint pain 11/28/2016  . Low back pain with bilateral sciatica 11/28/2016  . Panic disorder without agoraphobia with moderate panic attacks 07/01/2015  .  Tinnitus 07/01/2015  . Chronic insomnia 10/03/2013  . Plantar fasciitis, bilateral 03/28/2013  . Fibromyalgia 06/26/2011  . MICROSCOPIC HEMATURIA 04/15/2010  . PARESTHESIA 09/28/2009  . PALPITATIONS, CHRONIC 09/28/2009  . INTERNAL HEMORRHOIDS WITHOUT MENTION COMP 02/10/2009  . IBS 02/10/2009  . FATIGUE, CHRONIC 01/12/2009  . Chronic cough 10/02/2008  . GENITAL HERPES 02/12/2008  . Hyperlipidemia 02/12/2008  . Anemia, iron deficiency 02/12/2008  . Major depressive disorder, recurrent episode, moderate ([place]) 02/12/2008  . COMMON MIGRAINE 02/12/2008  . Essential hypertension, benign 02/12/2008  . GERD 02/12/2008  . OSTEOARTHRITIS 02/12/2008  . ABDOMINAL PAIN, CHRONIC 02/12/2008  . HEART MURMUR, HX OF 02/12/2008    Avaiyah Strubel 09/14/2020, 4:46 PM  [facility] [address] [place], [place], [address] Phone: [phone]   Fax:  [phone]  Name: Atonya Templer MRN: [id number] Date of Birth: July 04, 1956  Raeford Razor, PT 09/14/20 4:46 PM Phone: [phone] Fax: [phone]

## 2020-09-21 ENCOUNTER — Encounter: Payer: Self-pay | Admitting: Physical Therapy

## 2020-09-21 ENCOUNTER — Other Ambulatory Visit: Payer: Self-pay

## 2020-09-21 ENCOUNTER — Ambulatory Visit: Payer: 59 | Admitting: Physical Therapy

## 2020-09-21 DIAGNOSIS — M542 Cervicalgia: Secondary | ICD-10-CM

## 2020-09-21 DIAGNOSIS — M5412 Radiculopathy, cervical region: Secondary | ICD-10-CM

## 2020-09-21 DIAGNOSIS — R293 Abnormal posture: Secondary | ICD-10-CM

## 2020-09-21 DIAGNOSIS — G44209 Tension-type headache, unspecified, not intractable: Secondary | ICD-10-CM

## 2020-09-21 NOTE — Therapy (Addendum)
[place], [place], [address] Phone: [phone]   Fax:  [phone]  Physical Therapy Treatment / Discharge note  Patient Details  Name: Mia Kline MRN: [id number] Date of Birth: 1956/02/02 Referring Provider (PT): Dr. Eliezer Lofts     Progress Note Reporting Period 06/29/20 to 09/21/20  See note below for Objective Data and Assessment of Progress/Goals.      Encounter Date: 09/21/2020   PT End of Session - 09/21/20 2800    Visit Number 10    Number of Visits 16    Date for PT Re-Evaluation 10/05/20    Authorization Type [place] at 10th visit    PT Start Time 0923    PT Stop Time 1001    PT Time Calculation (min) 38 min    Activity Tolerance Patient tolerated treatment well    Behavior During Therapy [facility] for tasks assessed/performed           Past Medical History:  Diagnosis Date  . Abdominal pain, generalized   . Abdominal pain, unspecified site   . Acute cystitis   . Acute sinusitis, unspecified   . Anemia    Iron deficiency  . Arm pain, left   . Chest pain, unspecified   . Common migraine   . Complication of anesthesia   . Cough   . Depression   . Dizziness and giddiness   . Elbow pain   . Elbow, forearm, and wrist, abrasion or friction burn, without mention of infection   . Genital herpes, unspecified   . GERD (gastroesophageal reflux disease)   . Headache(784.0)   . Heart murmur   . Hyperlipidemia   . Hypertension   . IBS (irritable bowel syndrome)   . Internal hemorrhoids without mention of complication   . Knee pain, left   . Microscopic hematuria   . Osteoarthrosis, unspecified whether generalized or localized, unspecified site   . Other malaise and fatigue   . Other screening mammogram   . Palpitations   . Paresthesia   . PONV (postoperative nausea and vomiting)   . Routine general medical examination at a health care facility   . Routine gynecological  examination   . Small bowel obstruction ([place])   . Urinary tract infection, site not specified     Past Surgical History:  Procedure Laterality Date  . ABDOMINAL HYSTERECTOMY    . Barium Follow Through  2007   because rectum twisted and couldn't do colonoscopy, was painful though  . barium swallow  2006   Hiatal hernia  . BILATERAL OOPHORECTOMY  2006   For large cyst  . CARDIOVASCULAR STRESS TEST  09/2009   Low risk  . PARTIAL HYSTERECTOMY  1998   Vaginal  . TOTAL KNEE ARTHROPLASTY Left 07/02/2017   Procedure: LEFT TOTAL KNEE ARTHROPLASTY;  Surgeon: Meredith Pel, MD;  Location: [facility];  Service: Orthopedics;  Laterality: Left;  . TUBAL LIGATION      There were no vitals filed for this visit.   Subjective Assessment - 09/21/20 0925    Subjective Generally sore and joint pain today.  Headaches are intermittent and sharp.  I have to get my Rt knee replaced too.  I'm not looking forward to that.    Diagnostic tests XR done:Multilevel degenerative changes in the cervical spine with moderateto severe osseous neuroforaminal narrowing at RIGHT C3-4, C4-5, C5-6and C6-    Currently in Pain? Yes    Pain Score 2  Pain Location Neck    Pain Orientation Left;Posterior;Lateral    Pain Descriptors / Indicators Aching    Pain Type Chronic pain    Pain Onset More than a month ago    Pain Frequency Intermittent              [facility] PT Assessment - 09/21/20 0001      Observation/Other Assessments   Focus on Therapeutic Outcomes (FOTO)  57%      Strength   Right Shoulder Flexion 4/5    Right Shoulder ABduction 4/5    Left Shoulder Flexion 4/5    Left Shoulder ABduction 4/5            [facility] Adult PT Treatment/Exercise - 09/21/20 0001      Shoulder Exercises: Standing   Horizontal ABduction Both;15 reps    Theraband Level (Shoulder Horizontal ABduction) Level 2 (Red)    Horizontal ABduction Limitations narrow grip overhead x 10 red band    Shoulder Flexion Weight (lbs) foam  roller reach x 10 at wall    Diagonals Both;15 reps    Theraband Level (Shoulder Diagonals) Level 2 (Red)      Shoulder Exercises: ROM/Strengthening   Nustep L5 UE and LE for 8 min good pace      Neck Exercises: Stretches   Corner Stretch 3 reps;30 seconds                  PT Education - 09/21/20 1001    Education Details FOTO, POC, DC prep    Person(s) Educated Patient    Methods Explanation    Comprehension Verbalized understanding            PT Short Term Goals - 09/14/20 1243      PT SHORT TERM GOAL #1   Title Pt will be I with HEP for posture, cervical flexibility    Status On-going      PT SHORT TERM GOAL #2   Title Pt will report min, temporary  relief of headache following exercises and PT session    Baseline feels better but lately her headaches have returned.    Status On-going      PT SHORT TERM GOAL #3   Title Pt will understand lifestyle factors that may contribute to her headaches and chronic pain    Status On-going      PT SHORT TERM GOAL #4   Title Pt will understand FOTO resutlts and potentialto improve her condition.    Status Achieved             PT Long Term Goals - 09/21/20 7654      PT LONG TERM GOAL #1   Title Pt will be able to be independent with HEP for cervical spine, posture upon discharge    Status On-going      PT LONG TERM GOAL #2   Title Pt will be able to report headaches as becoming more intermittent and mild with progression of PT    Baseline was met, now increase in frequency    Status On-going      PT LONG TERM GOAL #3   Title Pt will be able to improve bilateral UE strength to 5/5 bilaterally to improve neck support with lifting    Baseline 4/5 with pain    Status On-going      PT LONG TERM GOAL #4   Title Pt will be able to report no arm symptoms 75% of the time ,pain centralizing in cervical spine  Baseline really depends on what she is doing: lifting, cleaning, reaching pain in L arm >R , is improved     Status Partially Met                 Plan - 09/21/20 1002    Clinical Impression Statement Patient with no notable change in status.  Today, thinks the weather may be contributing to her general joint discomfort.  Pain in L arm with exercises.  Plan to come 1 more time and will see if she can get back to her doctor.  Discussed continued need to exercise, stretch and reduce postural strain.    PT Treatment/Interventions ADLs/Self Care Home Management;Cryotherapy;Traction;Gait training;Therapeutic exercise;Patient/family education;Taping;Manual techniques;Passive range of motion;Dry needling;Spinal Manipulations;Neuromuscular re-education;Therapeutic activities;Moist Heat;Electrical Stimulation;Other (comment)    PT Next Visit Plan DN, postural exercises.  DC from PT, HEP review    PT Home Exercise Plan [id number]: neck stretches, red band horizontal abd, green row and ext, asst rotation (snags)    Consulted and Agree with Plan of Care Patient           Patient will benefit from skilled therapeutic intervention in order to improve the following deficits and impairments:  Decreased mobility,Hypomobility,Obesity,Decreased range of motion,Decreased strength,Increased fascial restricitons,Impaired flexibility,Impaired UE functional use,Postural dysfunction,Pain  Visit Diagnosis: Radiculopathy, cervical region  Cervicalgia  Tension-type headache, not intractable, unspecified chronicity pattern  Abnormal posture     Problem List Patient Active Problem List   Diagnosis Date Noted  . Cervicogenic headache 06/01/2020  . Neck pain 06/01/2020  . Intertrigo 05/18/2020  . Memory loss 05/18/2020  . Prediabetes 05/13/2019  . Arthritis of knee 07/02/2017  . Multiple joint pain 11/28/2016  . Low back pain with bilateral sciatica 11/28/2016  . Panic disorder without agoraphobia with moderate panic attacks 07/01/2015  . Tinnitus 07/01/2015  . Chronic insomnia 10/03/2013  . Plantar  fasciitis, bilateral 03/28/2013  . Fibromyalgia 06/26/2011  . MICROSCOPIC HEMATURIA 04/15/2010  . PARESTHESIA 09/28/2009  . PALPITATIONS, CHRONIC 09/28/2009  . INTERNAL HEMORRHOIDS WITHOUT MENTION COMP 02/10/2009  . IBS 02/10/2009  . FATIGUE, CHRONIC 01/12/2009  . Chronic cough 10/02/2008  . GENITAL HERPES 02/12/2008  . Hyperlipidemia 02/12/2008  . Anemia, iron deficiency 02/12/2008  . Major depressive disorder, recurrent episode, moderate ([place]) 02/12/2008  . COMMON MIGRAINE 02/12/2008  . Essential hypertension, benign 02/12/2008  . GERD 02/12/2008  . OSTEOARTHRITIS 02/12/2008  . ABDOMINAL PAIN, CHRONIC 02/12/2008  . HEART MURMUR, HX OF 02/12/2008    Mia Kline 09/21/2020, 10:05 AM  [facility] [address] [place], [place], [address] Phone: [phone]   Fax:  [phone]  Name: Mia Kline MRN: [id number] Date of Birth: 15-Apr-1956   Raeford Razor, PT 09/21/20 10:06 AM Phone: [phone] Fax: [phone]      PHYSICAL THERAPY DISCHARGE SUMMARY  Visits from Start of Care: 10  Current functional level related to goals / functional outcomes: See goals   Remaining deficits: See assessment   Education / Equipment: HEP, theraband, posture  Plan: Patient agrees to discharge.  Patient goals were not met. Patient is being discharged due to lack of progress.  ?????        Kristoffer Leamon PT, DPT, LAT, ATC  09/27/20  10:53 AM

## 2020-09-27 ENCOUNTER — Ambulatory Visit: Payer: 59 | Admitting: Physical Therapy

## 2020-09-27 ENCOUNTER — Telehealth: Payer: Self-pay

## 2020-09-27 ENCOUNTER — Telehealth: Payer: Self-pay | Admitting: Physical Therapy

## 2020-09-27 NOTE — Telephone Encounter (Signed)
Spoke with pt regarding missed appointment today. She stated she didn't get a reminder call and was unsure if her appt was for today. I discussed that on the last visit we don't typically do any DN because we like to assess response on the following visit and since she would be discharged we would review her HEP, techniques to reduce muscle tension and address any questions she may have. She reported he was having issues with her hands from taking her cat to the vet and reported she wouldn't be able to do any exercise.   I suggested that we go ahead and discharge from PT, and that she follow up with her MD for further assessment. To Continue with her current HEP (once her hands have healed), and work on that for a couple months. If she doesn't see any improvement she could get an updated script from her MD at that time. Pt agreed with this plan.  Kristoffer Leamon PT, DPT, LAT, ATC  09/27/20  10:51 AM

## 2020-09-27 NOTE — Telephone Encounter (Signed)
Patient stated that she went to the nurse at her job and they prescribed her an antibiotic for the cat scratches and bites. Informed patient to take antibiotics as prescribed and call back to make an appointment if no improvement. Patient verbalized understanding.

## 2020-09-27 NOTE — Telephone Encounter (Signed)
[facility] Night - Client Nonclinical Telephone Record  [facility] Client [facility] Night - Client Client Site [facility] - Night Physician Kerby Nora - MD Contact Type Call Who Is Calling Patient / Member / Family / Caregiver Caller Name Allean Montfort Caller Phone Number [phone] Patient Name Mia Kline Patient DOB July 13, 1956 Call Type Message Only Information Provided Reason for Call Request to Schedule Office Appointment Initial Comment Caller is needing an appointment. Her cat tore her hands up while trying to take her to the vet. Additional Comment Provided hours, declined triage. Disp. Time Disposition Final User 09/27/2020 7:30:33 AM General Information Provided Yes Marianna Fuss Call Closed By: Marianna Fuss Transaction Date/Time: 09/27/2020 7:27:19 AM (ET)

## 2020-09-28 ENCOUNTER — Encounter: Payer: Self-pay | Admitting: Family Medicine

## 2020-09-28 ENCOUNTER — Ambulatory Visit (INDEPENDENT_AMBULATORY_CARE_PROVIDER_SITE_OTHER): Payer: 59 | Admitting: Family Medicine

## 2020-09-28 ENCOUNTER — Other Ambulatory Visit: Payer: Self-pay

## 2020-09-28 DIAGNOSIS — S61459A Open bite of unspecified hand, initial encounter: Secondary | ICD-10-CM

## 2020-09-28 DIAGNOSIS — W5501XA Bitten by cat, initial encounter: Secondary | ICD-10-CM

## 2020-09-28 MED ORDER — AZITHROMYCIN 250 MG PO TABS: ORAL_TABLET | ORAL | 0 refills | Status: DC

## 2020-09-28 NOTE — Telephone Encounter (Signed)
Pt already has appt scheduled with Dr Reece Agar today at 12 noon.

## 2020-09-28 NOTE — Telephone Encounter (Addendum)
I spoke with pt; right hand is swollen and hurts; pain level now 8-9; no redness and no drainage;pt was bitten on 09/25/20 by her cat while putting cat in crate to take to vet due to cat sneezing & coughing a lot; vet advised pt that the cat has sinus infection; cat is up to date on shots. pt started abx for herself on 09/27/20.UC & ED precautions given and pt voiced understanding; no fever or other covid symptoms. Pt has in office appt 09/28/20 at 12 noon with Dr Reece Agar. Pt voiced understanding.

## 2020-09-28 NOTE — Telephone Encounter (Signed)
[facility] Day - Client TELEPHONE ADVICE RECORD [facility] Patient Name: ZELLIE JENNING Gender: Female DOB: 1955-11-20 Age: 65 Y 9 M 18 D Return Phone Number: [phone] (Primary) Address[place] [address] Client [facility] Day - Client Client Site [facility] - Day Physician Kerby Nora - MD Contact Type Call Who Is Calling Patient / Member / Family / Caregiver Call Type Triage / Clinical Relationship To Patient Self Return Phone Number [phone] (Primary) Chief Complaint Animal Bite Reason for Call Symptomatic / Request for Health Information Initial Comment Caller states she was bitten and scratched by cat and her hands are swollen. She is currently on antibiotics since yesterday. Translation No Disp. Time [name] Time) Disposition Final User 09/28/2020 9:14:43 AM Attempt made - message left Kerrin Champagne 09/28/2020 9:31:04 AM Attempt made - message left Carmon, RN, Angelique Blonder 09/28/2020 9:52:21 AM FINAL ATTEMPT MADE - message left

## 2020-09-28 NOTE — Patient Instructions (Signed)
Continue augmentin antibiotic for cat bite - full 10 days.  Add on azithromycin antibiotic for possible scratches and swollen glands. Elevate hands, use warm compresses, take aleve 220mg  2 tablets twice daily with meals for the next 5 days.  Let know if worsening pain/swelling, streaking redness, or numbness developing.   Animal Bite, Adult Animal bites range from mild to serious. An animal bite can result in any of these injuries:  A scratch.  A deep, open cut.  A puncture of the skin.  A crush injury.  Tearing away of the skin or a body part.  A bone injury. A small bite from a house pet is usually less serious than a bite from a stray or wild animal, such as a raccoon, fox, skunk, or bat. That is because stray and wild animals have a higher risk of carrying a serious infection called rabies, which can be passed to humans through a bite. What increases the risk? You are more likely to be bitten by an animal if:  You are around unfamiliar pets.  You disturb an animal when it is eating, sleeping, or caring for its babies.  You are outdoors in a place where small, wild animals roam freely. What are the signs or symptoms? Common symptoms of an animal bite include:  Pain.  Bleeding.  Swelling.  Bruising. How is this diagnosed? This condition may be diagnosed based on a physical exam and medical history. Your health care provider will examine your wound and ask for details about the animal and how the bite happened. You may also have tests, such as:  Blood tests to check for infection.  X-rays to check for damage to bones or joints.  Taking a fluid sample from your wound and checking it for infection (culture test). How is this treated? Treatment varies depending on the type of animal, where the bite is on your body, and your medical history. Treatment may include:  Caring for the wound. This often includes cleaning the wound, rinsing out (flushing) the wound with  saline solution, and applying a bandage (dressing). In some cases, the wound may be closed with stitches (sutures), staples, skin glue, or adhesive strips.  Antibiotic medicine to prevent or treat infection. This medicine may be prescribed in pill or ointment form. If the bite area becomes infected, the medicine may be given through an IV.  A tetanus shot to prevent tetanus infection.  Rabies treatment to prevent rabies infection. This will be done if the animal could have rabies.  Surgery. This may be done if a bite gets infected or if there is damage that needs to be repaired. Follow these instructions at home: Wound care  Follow instructions from your health care provider about how to take care of your wound. Make sure you: ? Wash your hands with soap and water before you change your dressing. If soap and water are not available, use hand sanitizer. ? Change your dressing as told by your health care provider. ? Leave sutures, skin glue, or adhesive strips in place. These skin closures may need to stay in place for 2 weeks or longer. If adhesive strip edges start to loosen and curl up, you may trim the loose edges. Do not remove adhesive strips completely unless your health care provider tells you to do that.  Check your wound every day for signs of infection. Check for: ? More redness, swelling, or pain. ? More fluid or blood. ? Warmth. ? Pus or a bad smell.  Medicines  Take or apply over-the-counter and prescription medicines only as told by your health care provider.  If you were prescribed an antibiotic, take or apply it as told by your health care provider. Do not stop using the antibiotic even if your condition improves. General instructions  Keep the injured area raised (elevated) above the level of your heart while you are sitting or lying down, if this is possible.  If directed, put ice on the injured area. ? Put ice in a plastic bag. ? Place a towel between your skin and  the bag. ? Leave the ice on for 20 minutes, 2-3 times per day.  Keep all follow-up visits as told by your health care provider. This is important.   Contact a health care provider if:  You have more redness, swelling, or pain around your wound.  Your wound feels warm to the touch.  You have a fever or chills.  You have a general feeling of sickness (malaise).  You feel nauseous or you vomit.  You have pain that does not get better. Get help right away if:  You have a red streak that leads away from your wound.  You have non-clear fluid or more blood coming from your wound.  There is pus or a bad smell coming from your wound.  You have trouble moving your injured area.  You have numbness or tingling that extends beyond the wound. Summary  Animal bites can range from mild to serious. An animal bite can cause a scratch on the skin, a deep open cut, a puncture of the skin, a crush injury, tearing away of the skin or a body part, or a bone injury.  Your health care provider will examine your wound and ask for details about the animal and how the bite happened.  You may also have tests such as a blood test, X-ray, or testing of a fluid sample from your wound (culture test).  Treatment may include wound care, antibiotic medicine, a tetanus shot, and rabies treatment if the animal could have rabies. This information is not intended to replace advice given to you by your health care provider. Make sure you discuss any questions you have with your health care provider. Document Revised: 05/11/2020 Document Reviewed: 05/11/2020 Elsevier Patient Education  2021 [employer].

## 2020-09-28 NOTE — Telephone Encounter (Signed)
Seen today. 

## 2020-09-28 NOTE — Progress Notes (Signed)
Patient ID: Mia Kline, female    DOB: 1956/04/08, 65 y.o.   MRN: [id number]  This visit was conducted in person.  BP 134/88   Pulse 84   Temp 98 F (36.7 C) (Temporal)   Ht 5' 6.73" (1.695 m)   Wt 226 lb 1 oz (102.5 kg)   SpO2 95%   BMI 35.69 kg/m    CC: cat scratch/bites  Subjective:   HPI: Mia Kline is a 65 y.o. female presenting on 09/28/2020 for Animal Bite (C/o cat bite to bilateral hands.  Has pain, swelling and redness in right hand. Taking abx given at work. Happened on 09/25/20.  Last Td, 05/18/20.  )   DOI: 09/25/20.  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 Cat is fully immunized.  Seen at [facility] yesterday, started on 10d augmentin antibiotic, first dose was yesterday PM. She also took 3 aleve. UCC reviewed.  No fevers/chills. No streaking redness. No nausea/vomiting, abd pain. R>L hands are very painful and swollen.  R handed.      Relevant past medical, surgical, family and social history reviewed and updated as indicated. Interim medical history since our last visit reviewed. Allergies and medications reviewed and updated. Outpatient Medications Prior to Visit  Medication Sig Dispense Refill  . amoxicillin-clavulanate (AUGMENTIN) 875-125 MG tablet Take by mouth.    . B Complex-C (SUPER B COMPLEX PO) Take 1 tablet by mouth daily.    . Calcium Carbonate-Vitamin D (CALCIUM 600+D3 PO) Take 2 tablets by mouth daily.    . Cholecalciferol (VITAMIN D-3) 1000 UNITS CAPS Take 1 capsule by mouth daily.    . Cholecalciferol (VITAMIN D3) 1.25 MG (50000 UT) CAPS Take 1 capsule by mouth every 7 (seven) days. 12 capsule 0  . hydrochlorothiazide (HYDRODIURIL) 25 MG tablet TAKE 1 TABLET BY MOUTH  DAILY 90 tablet 1  . Magnesium 400 MG TABS Take by mouth.    . nystatin cream (MYCOSTATIN) Apply 1 application topically 2 (two) times daily. 30 g 0  .  pantoprazole (PROTONIX) 40 MG tablet TAKE 1 TABLET BY MOUTH  DAILY 90 tablet 3  . simvastatin (ZOCOR) 40 MG tablet TAKE 1 TABLET BY MOUTH AT  BEDTIME 90 tablet 3  . triamcinolone cream (KENALOG) 0.1 % Apply 1 application topically 2 (two) times daily. Apply to affected area twice daily (rash) 15 g 0  . venlafaxine XR (EFFEXOR-XR) 150 MG 24 hr capsule TAKE 1 CAPSULE BY MOUTH  DAILY WITH BREAKFAST 90 capsule 1  . amoxicillin (AMOXIL) 500 MG tablet 2g 1hr prior to dental procedure    . amoxicillin (AMOXIL) 500 MG tablet 2g 1hr prior to dental procedure 20 tablet 0   No facility-administered medications prior to visit.     Per HPI unless specifically indicated in ROS section below Review of Systems Objective:  BP 134/88   Pulse 84   Temp 98 F (36.7 C) (Temporal)   Ht 5' 6.73" (1.695 m)   Wt 226 lb 1 oz (102.5 kg)   SpO2 95%   BMI 35.69 kg/m   Wt Readings from Last 3 Encounters:  09/28/20 226 lb 1 oz (102.5 kg)  07/20/20 222 lb 6.4 oz (100.9 kg)  06/01/20 221 lb 12 oz (100.6 kg)      Physical Exam Vitals and nursing note reviewed.  Constitutional:      Appearance: Normal appearance. She is  not ill-appearing.  Chest:  Breasts:     Right: Axillary adenopathy (small, tender) present. No supraclavicular adenopathy.     Left: No axillary adenopathy or supraclavicular adenopathy.    Musculoskeletal:     Comments: 2+ rad pulses bilaterally  Lymphadenopathy:     Upper Body:     Right upper body: Axillary adenopathy (small, tender) present. No supraclavicular adenopathy.     Left upper body: No supraclavicular or axillary adenopathy.  Skin:    General: Skin is warm and dry.     Findings: Erythema present.     Comments:  Few scabbed healing scratches to L lower extremity  Few scratches to L dorsal hand with bruising without erythema/swelling  Small scabbed puncture wound to R dorsal hand with tender swelling and mild erythema without fluctuance or induration but no streaking  redness  Neurological:     Mental Status: She is alert.     Comments: Sensation intact  Psychiatric:        Mood and Affect: Mood normal.        Behavior: Behavior normal.       Assessment & Plan:  This visit occurred during the SARS-CoV-2 public health emergency.  Safety protocols were in place, including screening questions prior to the visit, additional usage of staff PPE, and extensive cleaning of exam room while observing appropriate contact time as indicated for disinfecting solutions.   Problem List Items Addressed This Visit    Cat bite of hand    Bilateral, L hand healing well, R hand with persistent swelling (has only received 3 doses of augmentin antibiotic through [facility]).  Given concern for cat scratch disease (swollen tender axillary lymph nodes with hand swelling) will add azithromycin course to cover Bartonella.  Discussed further home care to include hand elevation and cool compresses.           Meds ordered this encounter  Medications  . azithromycin (ZITHROMAX) 250 MG tablet    Sig: Take two tablets on day one followed by one tablet on days 2-5    Dispense:  6 each    Refill:  0   No orders of the defined types were placed in this encounter.   Patient instructions: Continue augmentin antibiotic for cat bite - full 10 days.  Add on azithromycin antibiotic for possible scratches and swollen glands. Elevate hands, use warm compresses, take aleve 220mg  2 tablets twice daily with meals for the next 5 days.  Let know if worsening pain/swelling, streaking redness, or numbness developing.   Follow up plan: Return if symptoms worsen or fail to improve.  [place], MD

## 2020-09-28 NOTE — Telephone Encounter (Signed)
[facility] Night - Client Nonclinical Telephone Record AccessNurse Client [place] [facility] Night - Client Client Site  [facility] [place] - Night Physician Kerby Nora - MD Contact Type Call Who Is Calling Patient / Member / Family / Caregiver Caller Name Kyli Sorter Caller Phone Number [phone] Patient Name Mia Kline Patient DOB [birth year] Call Type Message Only Information Provided Reason for Call Request to Schedule Office Appointment Initial Comment Caller states she would like to schedule an appointment for an infection from her kitten biting her. She is taking antibiotics, but would like fluid drained. Additional Comment Caller states she will call back during office hours. Disp. Time Disposition Final User 09/28/2020 7:51:24 AM General Information Provided Yes Doug Sou Call Closed By: Doug Sou Transaction Date/Time: 09/28/2020 7:45:11 AM (ET)

## 2020-09-29 DIAGNOSIS — S61459A Open bite of unspecified hand, initial encounter: Secondary | ICD-10-CM | POA: Insufficient documentation

## 2020-09-29 DIAGNOSIS — W5501XA Bitten by cat, initial encounter: Secondary | ICD-10-CM | POA: Insufficient documentation

## 2020-09-29 NOTE — Assessment & Plan Note (Signed)
Bilateral, L hand healing well, R hand with persistent swelling (has only received 3 doses of augmentin antibiotic through [facility]).  Given concern for cat scratch disease (swollen tender axillary lymph nodes with hand swelling) will add azithromycin course to cover Bartonella.  Discussed further home care to include hand elevation and cool compresses.

## 2020-10-06 ENCOUNTER — Ambulatory Visit: Payer: 59 | Admitting: Family Medicine

## 2020-10-08 ENCOUNTER — Telehealth: Payer: Self-pay | Admitting: Family Medicine

## 2020-10-08 NOTE — Telephone Encounter (Signed)
Error

## 2020-10-24 ENCOUNTER — Other Ambulatory Visit: Payer: Self-pay | Admitting: Family Medicine

## 2021-05-12 ENCOUNTER — Other Ambulatory Visit: Payer: 59

## 2021-05-20 ENCOUNTER — Other Ambulatory Visit: Payer: Self-pay

## 2021-05-20 ENCOUNTER — Encounter: Payer: Self-pay | Admitting: Family Medicine

## 2021-05-20 ENCOUNTER — Ambulatory Visit (INDEPENDENT_AMBULATORY_CARE_PROVIDER_SITE_OTHER): Payer: 59 | Admitting: Family Medicine

## 2021-05-20 VITALS — BP 124/82 | HR 83 | Temp 97.8°F | Ht 67.0 in | Wt 226.0 lb

## 2021-05-20 DIAGNOSIS — R413 Other amnesia: Secondary | ICD-10-CM | POA: Diagnosis not present

## 2021-05-20 DIAGNOSIS — Z23 Encounter for immunization: Secondary | ICD-10-CM

## 2021-05-20 DIAGNOSIS — B372 Candidiasis of skin and nail: Secondary | ICD-10-CM

## 2021-05-20 DIAGNOSIS — Z0000 Encounter for general adult medical examination without abnormal findings: Secondary | ICD-10-CM

## 2021-05-20 DIAGNOSIS — R400 Somnolence: Secondary | ICD-10-CM

## 2021-05-20 DIAGNOSIS — D508 Other iron deficiency anemias: Secondary | ICD-10-CM | POA: Diagnosis not present

## 2021-05-20 DIAGNOSIS — Z1211 Encounter for screening for malignant neoplasm of colon: Secondary | ICD-10-CM

## 2021-05-20 DIAGNOSIS — R7303 Prediabetes: Secondary | ICD-10-CM | POA: Diagnosis not present

## 2021-05-20 DIAGNOSIS — F331 Major depressive disorder, recurrent, moderate: Secondary | ICD-10-CM

## 2021-05-20 DIAGNOSIS — F5104 Psychophysiologic insomnia: Secondary | ICD-10-CM

## 2021-05-20 DIAGNOSIS — E2839 Other primary ovarian failure: Secondary | ICD-10-CM

## 2021-05-20 DIAGNOSIS — E7800 Pure hypercholesterolemia, unspecified: Secondary | ICD-10-CM

## 2021-05-20 DIAGNOSIS — I10 Essential (primary) hypertension: Secondary | ICD-10-CM

## 2021-05-20 LAB — COMPREHENSIVE METABOLIC PANEL
ALT: 32 U/L (ref 0–35)
AST: 28 U/L (ref 0–37)
Albumin: 4.6 g/dL (ref 3.5–5.2)
Alkaline Phosphatase: 67 U/L (ref 39–117)
BUN: 18 mg/dL (ref 6–23)
CO2: 31 mEq/L (ref 19–32)
Calcium: 9.5 mg/dL (ref 8.4–10.5)
Chloride: 101 mEq/L (ref 96–112)
Creatinine, Ser: 0.75 mg/dL (ref 0.40–1.20)
GFR: 83.53 mL/min (ref >60.00)
Glucose, Bld: 93 mg/dL (ref 70–99)
Potassium: 4 mEq/L (ref 3.5–5.1)
Sodium: 141 mEq/L (ref 135–145)
Total Bilirubin: 0.4 mg/dL (ref 0.2–1.2)
Total Protein: 6.9 g/dL (ref 6.0–8.3)

## 2021-05-20 LAB — CBC WITH DIFFERENTIAL/PLATELET
Basophils Absolute: 0 10*3/uL (ref 0.0–0.1)
Basophils Relative: 0.4 % (ref 0.0–3.0)
Eosinophils Absolute: 0.1 10*3/uL (ref 0.0–0.7)
Eosinophils Relative: 0.7 % (ref 0.0–5.0)
HCT: 42.7 % (ref 36.0–46.0)
Hemoglobin: 14.1 g/dL (ref 12.0–15.0)
Lymphocytes Relative: 17.1 % (ref 12.0–46.0)
Lymphs Abs: 1.5 10*3/uL (ref 0.7–4.0)
MCHC: 32.9 g/dL (ref 30.0–36.0)
MCV: 84.9 fl (ref 78.0–100.0)
Monocytes Absolute: 0.5 10*3/uL (ref 0.1–1.0)
Monocytes Relative: 5.9 % (ref 3.0–12.0)
Neutro Abs: 6.8 10*3/uL (ref 1.4–7.7)
Neutrophils Relative %: 75.9 % (ref 43.0–77.0)
Platelets: 210 10*3/uL (ref 150.0–400.0)
RBC: 5.03 Mil/uL (ref 3.87–5.11)
RDW: 14 % (ref 11.5–15.5)
WBC: 9 10*3/uL (ref 4.0–10.5)

## 2021-05-20 LAB — HEMOGLOBIN A1C: Hgb A1c MFr Bld: 5.8 % (ref 4.6–6.5)

## 2021-05-20 LAB — LIPID PANEL
Cholesterol: 214 mg/dL — ABNORMAL HIGH (ref 0–200)
HDL: 71.4 mg/dL (ref >39.00)
LDL Cholesterol: 109 mg/dL — ABNORMAL HIGH (ref 0–99)
NonHDL: 142.81
Total CHOL/HDL Ratio: 3
Triglycerides: 170 mg/dL — ABNORMAL HIGH (ref 0.0–149.0)
VLDL: 34 mg/dL (ref 0.0–40.0)

## 2021-05-20 LAB — TSH: TSH: 2.18 u[IU]/mL (ref 0.35–5.50)

## 2021-05-20 LAB — VITAMIN D 25 HYDROXY (VIT D DEFICIENCY, FRACTURES): VITD: 43.97 ng/mL (ref 30.00–100.00)

## 2021-05-20 LAB — VITAMIN B12: Vitamin B-12: 466 pg/mL (ref 211–911)

## 2021-05-20 MED ORDER — FLUCONAZOLE 150 MG PO TABS: 150.0000 mg | ORAL_TABLET | Freq: Once | ORAL | 0 refills | Status: AC

## 2021-05-20 NOTE — Assessment & Plan Note (Signed)
Possibly contributing to memory issues.

## 2021-05-20 NOTE — Progress Notes (Signed)
Patient ID: Mia Kline, female    DOB: 06-19-1956, 65 y.o.   MRN: [id number]  This visit was conducted in person.  BP 124/82 (BP Location: Left Arm, Patient Position: Sitting, Cuff Size: Normal)   Pulse 83   Temp 97.8 F (36.6 C) (Temporal)   Ht 5\' 7"  (1.702 m)   Wt 226 lb (102.5 kg)   SpO2 96%   BMI 35.40 kg/m    CC: Chief Complaint  Patient presents with   Annual Exam    Subjective:   HPI: Mia Kline is a 65 y.o. female presenting on 05/20/2021 for Annual Exam   Has noted new onset rash under breast off and on under breasts. Using  Hypertension:   Good control on HCTZ  BP Readings from Last 3 Encounters:  05/20/21 124/82  09/28/20 134/88  07/20/20 (!) 130/96  Using medication without problems or lightheadedness:  none Chest pain with exertion: none Edema: none Short of breath: none Average home BPs: Other issues:   Elevated Cholesterol:  previous LDL at goal on simvastatin 40 mg daily.. due  for re-eval. Using medications without problems: Muscle aches:  Diet compliance: moderate Exercise:  walking the dog. Other complaints:    Prediabetes  Lab Results  Component Value Date   HGBA1C 5.6 05/11/2020    MDD: Moderate control on venlafaxine.07/11/2020 decreased motivation. Depression screen [facility] 2/9 05/20/2021 05/18/2020 05/13/2019  Decreased Interest 1 0 1  Down, Depressed, Hopeless 1 0 0  PHQ - 2 Score 2 0 1  Altered sleeping 1 1 0  Tired, decreased energy 1 1 1   Change in appetite 1 0 0  Feeling bad or failure about yourself  0 1 0  Trouble concentrating 1 1 0  Moving slowly or fidgety/restless 0 0 0  Suicidal thoughts 0 0 0  PHQ-9 Score 6 4 2   Difficult doing work/chores - Not difficult at all Not difficult at all     Memory loss, chronic.. has noted decline for years, worse in last year. Negative lab eval with normal B12, vit D, CMET in past Her manager at work sent a note with her stating she " forgets things all the time, gets lost ear in  the car"   Continued issue with insomnia and pain from fibromyalgia as well as MDD may be contributing. She sleeps average 5-7, but wakes up sleepy. Not sure if snores at night.  Occ waking up with headache.  Sometimes sleeps all day.  Relevant past medical, surgical, family and social history reviewed and updated as indicated. Interim medical history since our last visit reviewed. Allergies and medications reviewed and updated. Outpatient Medications Prior to Visit  Medication Sig Dispense Refill   amoxicillin (AMOXIL) 500 MG tablet 2g 1hr prior to dental procedure     azithromycin (ZITHROMAX) 250 MG tablet Take two tablets on day one followed by one tablet on days 2-5 6 each 0   B Complex-C (SUPER B COMPLEX PO) Take 1 tablet by mouth daily.     Calcium Carbonate-Vitamin D (CALCIUM 600+D3 PO) Take 2 tablets by mouth daily.     Cholecalciferol (VITAMIN D-3) 1000 UNITS CAPS Take 1 capsule by mouth daily.     Cholecalciferol (VITAMIN D3) 1.25 MG (50000 UT) CAPS Take 1 capsule by mouth every 7 (seven) days. 12 capsule 0   hydrochlorothiazide (HYDRODIURIL) 25 MG tablet TAKE 1 TABLET BY MOUTH  DAILY 90 tablet 1   Magnesium 400 MG TABS Take by mouth.  nystatin cream (MYCOSTATIN) Apply 1 application topically 2 (two) times daily. 30 g 0   pantoprazole (PROTONIX) 40 MG tablet TAKE 1 TABLET BY MOUTH  DAILY 90 tablet 3   simvastatin (ZOCOR) 40 MG tablet TAKE 1 TABLET BY MOUTH AT  BEDTIME 90 tablet 3   triamcinolone cream (KENALOG) 0.1 % Apply 1 application topically 2 (two) times daily. Apply to affected area twice daily (rash) 15 g 0   venlafaxine XR (EFFEXOR-XR) 150 MG 24 hr capsule TAKE 1 CAPSULE BY MOUTH  DAILY WITH BREAKFAST 90 capsule 1   No facility-administered medications prior to visit.     Per HPI unless specifically indicated in ROS section below Review of Systems  Constitutional:  Negative for fatigue and fever.  HENT:  Negative for congestion.   Eyes:  Negative for pain.   Respiratory:  Negative for cough and shortness of breath.   Cardiovascular:  Negative for chest pain, palpitations and leg swelling.  Gastrointestinal:  Negative for abdominal pain.  Genitourinary:  Negative for dysuria and vaginal bleeding.  Musculoskeletal:  Negative for back pain.  Neurological:  Negative for syncope and light-headedness.  Psychiatric/Behavioral:  Negative for dysphoric mood.   Objective:  BP 124/82 (BP Location: Left Arm, Patient Position: Sitting, Cuff Size: Normal)   Pulse 83   Temp 97.8 F (36.6 C) (Temporal)   Ht 5\' 7"  (1.702 m)   Wt 226 lb (102.5 kg)   SpO2 96%   BMI 35.40 kg/m   Wt Readings from Last 3 Encounters:  05/20/21 226 lb (102.5 kg)  09/28/20 226 lb 1 oz (102.5 kg)  07/20/20 222 lb 6.4 oz (100.9 kg)      Physical Exam Constitutional:      General: She is not in acute distress.    Appearance: Normal appearance. She is well-developed. She is not ill-appearing or toxic-appearing.  HENT:     Head: Normocephalic.     Right Ear: Hearing, tympanic membrane, ear canal and external ear normal. Tympanic membrane is not erythematous, retracted or bulging.     Left Ear: Hearing, tympanic membrane, ear canal and external ear normal. Tympanic membrane is not erythematous, retracted or bulging.     Nose: No mucosal edema or rhinorrhea.     Right Sinus: No maxillary sinus tenderness or frontal sinus tenderness.     Left Sinus: No maxillary sinus tenderness or frontal sinus tenderness.     Mouth/Throat:     Pharynx: Uvula midline.  Eyes:     General: Lids are normal. Lids are everted, no foreign bodies appreciated.     Conjunctiva/sclera: Conjunctivae normal.     Pupils: Pupils are equal, round, and reactive to light.  Neck:     Thyroid: No thyroid mass or thyromegaly.     Vascular: No carotid bruit.     Trachea: Trachea normal.  Cardiovascular:     Rate and Rhythm: Normal rate and regular rhythm.     Pulses: Normal pulses.     Heart sounds: Normal  heart sounds, S1 normal and S2 normal. No murmur heard.   No friction rub. No gallop.  Pulmonary:     Effort: Pulmonary effort is normal. No tachypnea or respiratory distress.     Breath sounds: Normal breath sounds. No decreased breath sounds, wheezing, rhonchi or rales.  Abdominal:     General: Bowel sounds are normal.     Palpations: Abdomen is soft.     Tenderness: There is no abdominal tenderness.  Musculoskeletal:  Cervical back: Normal range of motion and neck supple.  Skin:    General: Skin is warm and dry.     Findings: No rash.  Neurological:     Mental Status: She is oriented to person, place, and time. She is lethargic.     GCS: GCS eye subscore is 4. GCS verbal subscore is 5. GCS motor subscore is 6.     Cranial Nerves: No cranial nerve deficit.     Sensory: No sensory deficit.     Motor: No abnormal muscle tone.     Coordination: Coordination normal.     Gait: Gait normal.     Deep Tendon Reflexes: Reflexes are normal and symmetric.     Comments: Nml cerebellar exam   No papilledema  Psychiatric:        Mood and Affect: Mood is not anxious or depressed. Affect is blunt.        Speech: Speech is delayed.        Behavior: Behavior is slowed and withdrawn. Behavior is cooperative.        Thought Content: Thought content normal.        Cognition and Memory: Cognition is impaired. Memory is impaired. She exhibits impaired recent memory. She does not exhibit impaired remote memory.        Judgment: Judgment normal.    MMSE 27/30 today.. short term memory.    Results for orders placed or performed in visit on 05/25/20  Fecal occult blood, imunochemical   Specimen: Stool  Result Value Ref Range   Fecal Occult Bld Negative Negative    This visit occurred during the SARS-CoV-2 public health emergency.  Safety protocols were in place, including screening questions prior to the visit, additional usage of staff PPE, and extensive cleaning of exam room while observing  appropriate contact time as indicated for disinfecting solutions.   COVID 19 screen:  No recent travel or known exposure to COVID19 The patient denies respiratory symptoms of COVID 19 at this time. The importance of social distancing was discussed today.   Assessment and Plan The patient's preventative maintenance and recommended screening tests for an annual wellness exam were reviewed in full today. Brought up to date unless services declined.  Counselled on the importance of diet, exercise, and its role in overall health and mortality. The patient's FH and SH was reviewed, including their home life, tobacco status, and drug and alcohol status.   Partial hysterectomy,then BSO,  no pap neeeded, no DVE.   Nml Mammogram 05/2020, due but she wishes to do every other year despite mother with breast cancer history.    DEXA due Up to Date with vaccines including COVID series x3, except flu ( gets at work), shingrix ( needs second), and due for prevnar 20. Colon cancer screening:02/25/2009 nml,  She does not want to repeat colonscopy.. will do IFOB instead. Last negative 04/2020... plan cologuard      HCV  Neg. Nonsmoker    Problem List Items Addressed This Visit     Anemia, iron deficiency   Relevant Orders   CBC with Differential/Platelet   Candidiasis, intertrigo    Treat with oral diflucan... if not improving.. repeat topical nystatin as well as longer course of diflucan.      Relevant Medications   fluconazole (DIFLUCAN) 150 MG tablet   Chronic insomnia     Possibly contributing to memory issues.      Essential hypertension, benign    Stable, chronic.  Continue current medication.  Relevant Orders   Ambulatory referral to Pulmonology   Hyperlipidemia   Relevant Orders   Lipid panel   Comprehensive metabolic panel   Major depressive disorder, recurrent episode, moderate (HCC)    Moderate control on venlafaxine.  May need neuropsych eval to determine if this  is primary cause of memory issues.      Memory loss    Worsening. No clear past lab abnormality.  Likely multifactorial including MDD and insomnia, both not ideally controlled despite med trials in past.   Concern for sleep apnea give daytime somnolence... will send for sleep evaluation.  Re-eval with labs.  Refer to neuro for consideration of parkinson's, other cause of early dementia      Relevant Orders   Ambulatory referral to Pulmonology   Ambulatory referral to Neurology   VITAMIN D 25 Hydroxy (Vit-D Deficiency, Fractures)   Vitamin B12   Prediabetes   Relevant Orders   Hemoglobin A1c   Other Visit Diagnoses     Routine general medical examination at a health care facility    -  Primary   Colon cancer screening       Relevant Orders   Cologuard   Daytime somnolence       Relevant Orders   Ambulatory referral to Pulmonology   Ambulatory referral to Neurology   TSH   Estrogen deficiency       Relevant Orders   DG Bone Density        Kerby Nora, MD

## 2021-05-20 NOTE — Assessment & Plan Note (Signed)
Worsening. No clear past lab abnormality.  Likely multifactorial including MDD and insomnia, both not ideally controlled despite med trials in past.   Concern for sleep apnea give daytime somnolence... will send for sleep evaluation.  Re-eval with labs.  Refer to neuro for consideration of parkinson's, other cause of early dementia

## 2021-05-20 NOTE — Assessment & Plan Note (Signed)
Stable, chronic.  Continue current medication.    

## 2021-05-20 NOTE — Patient Instructions (Addendum)
I will send in a referral  for a sleep study as well as a neurology referral.  Please stop at the lab to have labs drawn.  Take diflucan x 1 dose to treat yeast under breasts.    We will set up Cologuard for colon cancer screening.  Please call the location of your choice from the menu below to schedule your Mammogram and/or Bone Density appointment.    [facility]   [facility]                      Phone:  [phone] [address]                               [place], [place] [address]                                                             Services: Traditional and 3D Mammogram, Bone Density   [facility]                 Phone: [phone] [address]                                                       [place], [place] [address]    Service: Bone Density ONLY   *this site does NOT perform mammograms  [facility]                        Phone:  [phone] [address]                                  [place], [place] [address]                                            Services:  3D Mammogram and Bone Density    [facility]   Phone:  [phone]   [address]                                                                            [place], [place] [address]                                            Services: 3D Mammogram and Bone [facility])  Phone:  [address] [place], [place] [address]                                              Services:  3D Mammogram and Bone Density

## 2021-05-20 NOTE — Assessment & Plan Note (Signed)
Moderate control on venlafaxine.  May need neuropsych eval to determine if this is primary cause of memory issues.

## 2021-05-20 NOTE — Assessment & Plan Note (Signed)
Treat with oral diflucan... if not improving.. repeat topical nystatin as well as longer course of diflucan.

## 2021-05-23 ENCOUNTER — Other Ambulatory Visit: Payer: Self-pay | Admitting: Family Medicine

## 2021-05-23 MED ORDER — ATORVASTATIN CALCIUM 40 MG PO TABS: 40.0000 mg | ORAL_TABLET | Freq: Every day | ORAL | 3 refills | Status: AC

## 2021-06-09 LAB — COLOGUARD: COLOGUARD: NEGATIVE

## 2021-06-13 ENCOUNTER — Telehealth: Payer: Self-pay | Admitting: *Deleted

## 2021-06-13 NOTE — Telephone Encounter (Signed)
Mia Kline is asking for a refill on Diflucan.  She was given it in October for candidiasis, intertrigo.  She states it went away but now it has come back.  Per office note  treat with fluconazole (DIFLUCAN) 150 MG tablet.  If not improving, repeat topical nystatin as well as longer course of diflucan.  Please advise. [facility]acy[facility] [place].

## 2021-06-14 ENCOUNTER — Encounter: Payer: Self-pay | Admitting: Adult Health

## 2021-06-14 ENCOUNTER — Ambulatory Visit (INDEPENDENT_AMBULATORY_CARE_PROVIDER_SITE_OTHER): Payer: 59 | Admitting: Adult Health

## 2021-06-14 ENCOUNTER — Other Ambulatory Visit: Payer: Self-pay

## 2021-06-14 DIAGNOSIS — G4719 Other hypersomnia: Secondary | ICD-10-CM

## 2021-06-14 HISTORY — DX: Other hypersomnia: G47.19

## 2021-06-14 MED ORDER — FLUCONAZOLE 150 MG PO TABS: 150.0000 mg | ORAL_TABLET | Freq: Once | ORAL | 3 refills | Status: AC

## 2021-06-14 NOTE — Telephone Encounter (Signed)
Ms. Thau notified by telephone that Dr. Ermalene Searing sent her in refills on the D[facility]lucan to [facility] [place].

## 2021-06-14 NOTE — Patient Instructions (Signed)
Set up for home sleep study .  Healthy sleep regimen  Do not drive if sleepy  Follow up in 6-8 weeks to discuss results .

## 2021-06-14 NOTE — Progress Notes (Signed)
@Patient  ID: Bernita Buffy, female    DOB: 1955/08/26, 65 y.o.   MRN: [id number]  Chief Complaint  Patient presents with   Consult    Referring provider: Jinny Sanders, MD  HPI: 65 year old female never smoker seen for sleep consult June 14, 2021 for daytime sleepiness Medical history significant for fibromyalgia and hypertension  TEST/EVENTS :   06/14/2021 Sleep consult  Patient presents for a sleep consult.  Referred by primary care provider.  Patient complains of ongoing difficulty with daytime sleepiness, restless sleep.  Wakes up feeling tired.  Occasionally has a headache upon awakening.  Feels sleepy throughout the day.  Get gets very sleepy if she sits down to watch TV or reads. Patient has a medical history significant for fibromyalgia Patient goes to sleep around 11pm , watches TV in bed. Takes up to 30 min to go to bed. Does have some trouble going to sleep at times . Gets up in am 4:30 am . Does not get up at night .  No significant caffeine intake. Rare soda. No energy drinks.  No suspicious symptoms for cataplexy or sleepwalking, sleep paralysis ,  Epworth score is 15  No on sedating medications.  Lives alone , unsure is she snores.  Works full-time in office setting  Does not take naps on regular basis   Allergies  Allergen Reactions   Wellbutrin [Bupropion] Hives   Codeine Nausea Only    Immunization History  Administered Date(s) Administered   Influenza Inj Mdck Quad Pf 05/11/2018   Influenza Whole 05/20/2008, 05/06/2009   Influenza,inj,Quad PF,6+ Mos 05/01/2017, 04/08/2019   Influenza-Unspecified 05/01/2015, 04/30/2016, 04/30/2020, 06/20/2020, 05/10/2021   PFIZER(Purple Top)SARS-COV-2 Vaccination 10/30/2019, 11/27/2019, 07/16/2020   PNEUMOCOCCAL CONJUGATE-20 05/20/2021   Td 04/19/2010, 05/18/2020   Zoster Recombinat (Shingrix) 05/18/2020, 05/20/2021    Past Medical History:  Diagnosis Date   Abdominal pain, generalized    Abdominal pain,  unspecified site    Acute cystitis    Acute sinusitis, unspecified    Anemia    Iron deficiency   Arm pain, left    Chest pain, unspecified    Common migraine    Complication of anesthesia    Cough    Depression    Dizziness and giddiness    Elbow pain    Elbow, forearm, and wrist, abrasion or friction burn, without mention of infection    Genital herpes, unspecified    GERD (gastroesophageal reflux disease)    Headache(784.0)    Heart murmur    Hyperlipidemia    Hypertension    IBS (irritable bowel syndrome)    Internal hemorrhoids without mention of complication    Knee pain, left    Microscopic hematuria    Osteoarthrosis, unspecified whether generalized or localized, unspecified site    Other malaise and fatigue    Other screening mammogram    Palpitations    Paresthesia    PONV (postoperative nausea and vomiting)    Routine general medical examination at a health care facility    Routine gynecological examination    Small bowel obstruction (HCC)    Urinary tract infection, site not specified     Tobacco History: Social History   Tobacco Use  Smoking Status Never   Passive exposure: Past  Smokeless Tobacco Never   Counseling given: Not Answered  SH : Divorced, works in office setting. 3 adult children.  Never smoker. Social alcohol.  2 cats and 1 dog.   Surgical Hx  Hysterectomy age 34   FH :  Breast cancer -Mother.   Outpatient Medications Prior to Visit  Medication Sig Dispense Refill   amoxicillin (AMOXIL) 500 MG tablet 2g 1hr prior to dental procedure     atorvastatin (LIPITOR) 40 MG tablet Take 1 tablet (40 mg total) by mouth daily. 90 tablet 3   B Complex-C (SUPER B COMPLEX PO) Take 1 tablet by mouth daily.     Calcium Carbonate-Vitamin D (CALCIUM 600+D3 PO) Take 2 tablets by mouth daily.     Cholecalciferol (VITAMIN D-3) 1000 UNITS CAPS Take 1 capsule by mouth daily.     Cholecalciferol (VITAMIN D3) 1.25 MG (50000 UT) CAPS Take 1 capsule by  mouth every 7 (seven) days. 12 capsule 0   fluconazole (DIFLUCAN) 150 MG tablet Take 1 tablet (150 mg total) by mouth once for 1 dose. 2 tablet 3   hydrochlorothiazide (HYDRODIURIL) 25 MG tablet TAKE 1 TABLET BY MOUTH  DAILY 90 tablet 1   Magnesium 400 MG TABS Take by mouth.     nystatin cream (MYCOSTATIN) Apply 1 application topically 2 (two) times daily. 30 g 0   pantoprazole (PROTONIX) 40 MG tablet TAKE 1 TABLET BY MOUTH  DAILY 90 tablet 3   triamcinolone cream (KENALOG) 0.1 % Apply 1 application topically 2 (two) times daily. Apply to affected area twice daily (rash) 15 g 0   venlafaxine XR (EFFEXOR-XR) 150 MG 24 hr capsule TAKE 1 CAPSULE BY MOUTH  DAILY WITH BREAKFAST 90 capsule 1   No facility-administered medications prior to visit.     Review of Systems:   Constitutional:   No  weight loss, night sweats,  Fevers, chills,  +fatigue, or  lassitude.  HEENT:   No headaches,  Difficulty swallowing,  Tooth/dental problems, or  Sore throat,                No sneezing, itching, ear ache, nasal congestion, post nasal drip,   CV:  No chest pain,  Orthopnea, PND, swelling in lower extremities, anasarca, dizziness, palpitations, syncope.   GI  No heartburn, indigestion, abdominal pain, nausea, vomiting, diarrhea, change in bowel habits, loss of appetite, bloody stools.   Resp: No shortness of breath with exertion or at rest.  No excess mucus, no productive cough,  No non-productive cough,  No coughing up of blood.  No change in color of mucus.  No wheezing.  No chest wall deformity  Skin: no rash or lesions.  GU: no dysuria, change in color of urine, no urgency or frequency.  No flank pain, no hematuria   MS:  No joint pain or swelling.  No decreased range of motion.  No back pain.    Physical Exam  BP (!) 158/98 (BP Location: Left Arm, Patient Position: Sitting, Cuff Size: Large)   Pulse 86   Ht 5\' 7"  (1.702 m)   Wt 222 lb (100.7 kg)   SpO2 98%   BMI 34.77 kg/m   GEN: A/Ox3;  pleasant , NAD, well nourished    HEENT:  [place]/AT,  NOSE-clear, THROAT-clear, no lesions, no postnasal drip or exudate noted.  Class 2-3 MP airway   NECK:  Supple w/ fair ROM; no JVD; normal carotid impulses w/o bruits; no thyromegaly or nodules palpated; no lymphadenopathy.    RESP  Clear  P & A; w/o, wheezes/ rales/ or rhonchi. no accessory muscle use, no dullness to percussion  CARD:  RRR, no m/r/g, no peripheral edema, pulses intact, no cyanosis or clubbing.  GI:   Soft & nt; nml bowel sounds;  no organomegaly or masses detected.   Musco: Warm bil, no deformities or joint swelling noted.   Neuro: alert, no focal deficits noted.    Skin: Warm, no lesions or rashes    Lab Results:  CBC    Component Value Date/Time   WBC 9.0 05/20/2021 0950   RBC 5.03 05/20/2021 0950   HGB 14.1 05/20/2021 0950   HCT 42.7 05/20/2021 0950   PLT 210.0 05/20/2021 0950   MCV 84.9 05/20/2021 0950   MCH 25.1 (L) 06/27/2017 1211   MCHC 32.9 05/20/2021 0950   RDW 14.0 05/20/2021 0950   LYMPHSABS 1.5 05/20/2021 0950   MONOABS 0.5 05/20/2021 0950   EOSABS 0.1 05/20/2021 0950   BASOSABS 0.0 05/20/2021 0950    BMET    Component Value Date/Time   NA 141 05/20/2021 0950   K 4.0 05/20/2021 0950   CL 101 05/20/2021 0950   CO2 31 05/20/2021 0950   GLUCOSE 93 05/20/2021 0950   BUN 18 05/20/2021 0950   CREATININE 0.75 05/20/2021 0950   CALCIUM 9.5 05/20/2021 0950   GFRNONAA >60 06/27/2017 1211   GFRAA >60 06/27/2017 1211    BNP No results found for: BNP  ProBNP No results found for: PROBNP  Imaging: No results found.    No flowsheet data found.  No results found for: NITRICOXIDE      Assessment & Plan:   Excessive daytime sleepiness Daytime sleepiness, restless sleep, insomnia, and obesity suspicious for OSA  Set up for HST .  Patient education given  - discussed how weight can impact sleep and risk for sleep disordered breathing - discussed options to assist with weight  loss: combination of diet modification, cardiovascular and strength training exercises   - had an extensive discussion regarding the adverse health consequences related to untreated sleep disordered breathing - specifically discussed the risks for hypertension, coronary artery disease, cardiac dysrhythmias, cerebrovascular disease, and diabetes - lifestyle modification discussed   - discussed how sleep disruption can increase risk of accidents, particularly when driving - safe driving practices were discussed    Plan  Patient Instructions  Set up for home sleep study .  Healthy sleep regimen  Do not drive if sleepy  Follow up in 6-8 weeks to discuss results .        Rexene Edison, NP 06/14/2021

## 2021-06-14 NOTE — Telephone Encounter (Signed)
Given resolved and came back.. will just repeat diflucan 150 mg x 1.. please send in  #2 with refills #3

## 2021-06-14 NOTE — Assessment & Plan Note (Signed)
Daytime sleepiness, restless sleep, insomnia, and obesity suspicious for OSA  Set up for HST .  Patient education given  - discussed how weight can impact sleep and risk for sleep disordered breathing - discussed options to assist with weight loss: combination of diet modification, cardiovascular and strength training exercises   - had an extensive discussion regarding the adverse health consequences related to untreated sleep disordered breathing - specifically discussed the risks for hypertension, coronary artery disease, cardiac dysrhythmias, cerebrovascular disease, and diabetes - lifestyle modification discussed   - discussed how sleep disruption can increase risk of accidents, particularly when driving - safe driving practices were discussed    Plan  Patient Instructions  Set up for home sleep study .  Healthy sleep regimen  Do not drive if sleepy  Follow up in 6-8 weeks to discuss results .

## 2021-06-15 NOTE — Progress Notes (Signed)
Reviewed and agree with assessment/plan.   Coralyn Helling, MD [facility] Pulmonary/Critical Care 06/15/2021, 8:55 AM Pager:  [phone]

## 2021-07-10 ENCOUNTER — Other Ambulatory Visit: Payer: Self-pay | Admitting: Family Medicine

## 2021-08-08 ENCOUNTER — Encounter: Payer: Self-pay | Admitting: Neurology

## 2021-08-08 ENCOUNTER — Ambulatory Visit (INDEPENDENT_AMBULATORY_CARE_PROVIDER_SITE_OTHER): Payer: 59 | Admitting: Neurology

## 2021-08-08 VITALS — BP 133/90 | HR 91 | Ht 67.0 in | Wt 213.5 lb

## 2021-08-08 DIAGNOSIS — R519 Headache, unspecified: Secondary | ICD-10-CM

## 2021-08-08 DIAGNOSIS — R413 Other amnesia: Secondary | ICD-10-CM

## 2021-08-08 DIAGNOSIS — G8929 Other chronic pain: Secondary | ICD-10-CM

## 2021-08-08 DIAGNOSIS — R0683 Snoring: Secondary | ICD-10-CM

## 2021-08-08 HISTORY — DX: Headache, unspecified: R51.9

## 2021-08-08 HISTORY — DX: Snoring: R06.83

## 2021-08-08 MED ORDER — PROPRANOLOL HCL ER 80 MG PO CP24: 80.0000 mg | ORAL_CAPSULE | Freq: Every day | ORAL | 11 refills | Status: AC
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
## 2021-08-09 LAB — C-REACTIVE PROTEIN: CRP: <1 mg/L (ref 0–10)

## 2021-08-09 LAB — SEDIMENTATION RATE: Sed Rate: 12 mm/hr (ref 0–40)

## 2021-08-11 ENCOUNTER — Telehealth: Payer: Self-pay | Admitting: Neurology

## 2021-08-11 NOTE — Telephone Encounter (Signed)
MR Brain w/wo contrast Dr. [facility]: NPR via [facility] website. Patient is scheduled at [facility] for 08/16/21

## 2021-08-16 ENCOUNTER — Ambulatory Visit: Payer: 59

## 2021-08-16 DIAGNOSIS — R413 Other amnesia: Secondary | ICD-10-CM

## 2021-08-16 MED ORDER — GADOBENATE DIMEGLUMINE 529 MG/ML IV SOLN
20.0000 mL | Freq: Once | INTRAVENOUS | Status: AC | PRN
  Administered 2021-08-16: 20 mL via INTRAVENOUS

## 2021-08-22 ENCOUNTER — Telehealth: Payer: Self-pay | Admitting: Neurology

## 2021-08-22 NOTE — Telephone Encounter (Signed)
Patient is established here for memory loss and headaches.  I called her back. Episode of dizziness at work today. She was feeling hungry and stood up from her desk to go to lunch early. Upon standing, reports an onset of hot flash and dizziness. Remote history of vertigo. No issues in recent years. At the time I spoke to her, she was still having some residual dizziness but no other symptoms. Denies dehydration or known, active infection. No addition of new medications. 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 She was in the middle of a work-up when I spoke to her (vitals, observation, labs). She will follow the nurse's medical advice.

## 2021-08-22 NOTE — Telephone Encounter (Signed)
Pt want to let physician know after eating lunch at work started having some dizziness and hot flash today.   Advised pt to go to ER.  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

## 2021-08-22 NOTE — Telephone Encounter (Signed)
I spoke to the patient. She has completed her nurse visit. She is feeling better with only mild residual dizziness. States she had an elevated blood pressure on the exam. Says she missed her morning dose of BP meds. She is heading home to take her medication and rest.

## 2021-08-22 NOTE — Telephone Encounter (Signed)
Please call patient, MRI of the brain was normal on August 18, 2021, extensive laboratory evaluation in October 2022 only showed mild elevated LDL 109, rest of the laboratory evaluation showed no significant abnormalities  There are many possibilities for her positional related transient dizziness, including elevated blood pressure, orthostatic hypotension, tachycardia, dehydration, migraine variants, etc.  Please advise her continue observing her symptoms, increase water intake, avoiding missing her meals, resting well  Call clinic for recurrent symptoms,

## 2021-08-30 ENCOUNTER — Telehealth: Payer: Self-pay | Admitting: Family Medicine

## 2021-08-30 NOTE — Telephone Encounter (Signed)
I spoke with Lupita Leash NP with [employer]; Lupita Leash NP said the pt came to her on 08/22/21 for lightheadedness. Lupita Leash NP said that pt was a lot different from what Lupita Leash NP had noted in the past; Memory was a lot worse. Lupita Leash NP said that pt told her that she cannot remember if she has taken her meds.  Lupita Leash NP said that pt had difficulty in answering her questions appropriately and it took a long time for pt to give answers. Pt said she had not gotten MRI report and Lupita Leash NP wanted to know if we do not notify pt after ordering testing. I said yes we do but we did not order the MRI it was ordered by Neurology. I offered contact info for neurology but Lupita Leash NP said she did not want to go into that deeply she was just notifying pts PCP. Lupita Leash NP said MRI results may have been called to pt and pt did not remember. Lupita Leash NP said pt has no one to help her. Sending note to Dr Ermalene Searing for review and Lupita Leash NP said she does not need cb but if needed our office can call pt to bring her in for appt. Or whatever Dr Ermalene Searing thinks appropriate.

## 2021-08-30 NOTE — Telephone Encounter (Signed)
Mia Kline a NP called stating that pt has increasing memory lost ,states pt cant remember if pt has taken her medication. Mia Kline states pt is very forgetful.Please advise.

## 2021-08-30 NOTE — Telephone Encounter (Signed)
Please call and triage to get more information.  Patient was just seen by Neuro on 08/08/2021 for memory loss.   I am not sure who Lupita Leash, NP is.

## 2021-09-01 NOTE — Telephone Encounter (Signed)
Call patient.Marland Kitchen  if memory worsening she needs to return to neurology sooner than planned... if she cannot call on her own... please forward this note to neurology.  MRI and labs were normal.  If she is agreeable I will place a referral for social work for them to assist with any thing she needs if she is unable to care for herself and home, or if unable to work.  If she is agreeable I would also like  to refer her to psychiatry to determine if mood is a component of her memory issues given so far normal neuro work up.

## 2021-09-01 NOTE — Telephone Encounter (Signed)
Noted.  Will discuss sleep apnea eval which she was referred to at next OV.

## 2021-09-01 NOTE — Telephone Encounter (Signed)
Spoke with Mia Kline and advised her of Mia Kline recommendations.  She states she is able to function and wants to continuing working so she doesn't feel she needs a Child psychotherapist.  She also declines the referral to psychiatry.  She states she already has too many bills piling up.  She states since the MRI and labs were normal she doesn't see what going back to the neurologist would help.  She states she is fine as long as she stay busy.  Work did make her take the week off.  She states she just gets frustrated when she is going to do something and then forgets was she was going to do.  FYI to Mia Kline.

## 2021-09-16 ENCOUNTER — Other Ambulatory Visit: Payer: Self-pay

## 2021-09-16 ENCOUNTER — Ambulatory Visit (INDEPENDENT_AMBULATORY_CARE_PROVIDER_SITE_OTHER): Payer: 59 | Admitting: Family Medicine

## 2021-09-16 VITALS — BP 130/90 | HR 74 | Temp 98.0°F | Ht 67.0 in | Wt 213.4 lb

## 2021-09-16 DIAGNOSIS — F331 Major depressive disorder, recurrent, moderate: Secondary | ICD-10-CM | POA: Diagnosis not present

## 2021-09-16 DIAGNOSIS — R413 Other amnesia: Secondary | ICD-10-CM

## 2021-09-16 DIAGNOSIS — G4486 Cervicogenic headache: Secondary | ICD-10-CM | POA: Diagnosis not present

## 2021-09-16 DIAGNOSIS — R0683 Snoring: Secondary | ICD-10-CM

## 2021-09-16 MED ORDER — MELOXICAM 15 MG PO TABS: 15.0000 mg | ORAL_TABLET | Freq: Every day | ORAL | 0 refills | Status: DC

## 2021-09-16 NOTE — Patient Instructions (Addendum)
I will contact Pulmonology to set up sleep study again.  Stop aleve.  Try a course of meloxicam daily for headache likely coming from neck pain.  Call neurology given further decline in memory: Dr. [facility] [address], [address] [place], [place] [address] Ph: [phone]   I will call [facility] your manager to ask if she will help remind you to get these things done.

## 2021-09-16 NOTE — Progress Notes (Signed)
Patient ID: Mia Kline, female    DOB: Dec 11, 1955, 66 y.o.   MRN: [id number]  This visit was conducted in person.  Vitals:   09/16/21 1545  BP: 130/90  Pulse: 74  Temp: 98 F (36.7 C)  SpO2: 98%     CC: Chief Complaint  Patient presents with   Follow-up    Memory loss    Subjective:   HPI: Mia Kline is a 66 y.o. female presenting on 09/16/2021 for Follow-up (Memory loss)   She has continued to have memory loss, progressive. Started 2020.Marland Kitchen worsening over time, more significantly  in last 1-2 months.  She is having trouble at work. She is having trouble remembering what her job functions are.. cannot do her computer activities. Has a Freight forwarder at work who helps her and she gives verbal permission for me to contact her for help remembering appointments.  Reviewed new pt OV of Dr. Krista Blue from 08/08/2021  MoCA was 20/30  MRI brain ordered and found to be unremarkable  Normal B12, cbc, CMP and TSH Follow up 6 month recommended at that time  Chronic headaches:Sed rate and CRP were normal.. 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 pain in neck radiates to head.  Using aleve off and on.. does not help much.  Recommended sleep study... as pt  snoring fatigue,  falling asleep a lot She  saw Pulmonary and set up for home sleep test.. did not  have it done ( does not remember being set up fr it) or follow up to discuss results.   She has history of mood disorder and question if this is contributing to memory issues and headache. She currently denies depression except in relation to feeling sad that she is more of a burden at work and regarding her decreased ability to meet demands at work.   Relevant past medical, surgical, family and social history reviewed and updated as indicated. Interim medical history since our last visit reviewed. Allergies and medications reviewed and updated. Outpatient Medications Prior to Visit  Medication Sig  Dispense Refill   amoxicillin (AMOXIL) 500 MG tablet 2g 1hr prior to dental procedure     atorvastatin (LIPITOR) 40 MG tablet Take 1 tablet (40 mg total) by mouth daily. 90 tablet 3   B Complex-C (SUPER B COMPLEX PO) Take 1 tablet by mouth daily.     Calcium Carbonate-Vitamin D (CALCIUM 600+D3 PO) Take 2 tablets by mouth daily.     Cholecalciferol (VITAMIN D-3) 1000 UNITS CAPS Take 1 capsule by mouth daily.     Cholecalciferol (VITAMIN D3) 1.25 MG (50000 UT) CAPS Take 1 capsule by mouth every 7 (seven) days. 12 capsule 0   hydrochlorothiazide (HYDRODIURIL) 25 MG tablet TAKE 1 TABLET BY MOUTH  DAILY 90 tablet 1   Magnesium 400 MG TABS Take by mouth.     nystatin cream (MYCOSTATIN) Apply 1 application topically 2 (two) times daily. 30 g 0   pantoprazole (PROTONIX) 40 MG tablet TAKE 1 TABLET BY MOUTH  DAILY 90 tablet 3   propranolol ER (INDERAL LA) 80 MG 24 hr capsule Take 1 capsule (80 mg total) by mouth daily. 30 capsule 11   triamcinolone cream (KENALOG) 0.1 % Apply 1 application topically 2 (two) times daily. Apply to affected area twice daily (rash) 15 g 0   venlafaxine XR (EFFEXOR-XR) 150 MG 24 hr capsule TAKE 1 CAPSULE BY MOUTH  DAILY WITH BREAKFAST 90 capsule 1   No facility-administered  medications prior to visit.     Per HPI unless specifically indicated in ROS section below Review of Systems  Constitutional:  Positive for fatigue. Negative for fever.  HENT:  Negative for ear pain.   Eyes:  Negative for pain.  Respiratory:  Negative for chest tightness and shortness of breath.   Cardiovascular:  Negative for chest pain, palpitations and leg swelling.  Gastrointestinal:  Negative for abdominal pain.  Genitourinary:  Negative for dysuria.  Neurological:  Positive for headaches. Negative for weakness and light-headedness.  Psychiatric/Behavioral:  Positive for confusion and sleep disturbance. Negative for agitation, dysphoric mood, self-injury and suicidal ideas. The patient is not  nervous/anxious.   Objective:  There were no vitals taken for this visit.  Wt Readings from Last 3 Encounters:  08/08/21 213 lb 8 oz (96.8 kg)  06/14/21 222 lb (100.7 kg)  05/20/21 226 lb (102.5 kg)      Physical Exam Constitutional:      General: She is not in acute distress.    Appearance: Normal appearance. She is well-developed. She is not ill-appearing or toxic-appearing.  HENT:     Head: Normocephalic.     Right Ear: Hearing, tympanic membrane, ear canal and external ear normal. Tympanic membrane is not erythematous, retracted or bulging.     Left Ear: Hearing, tympanic membrane, ear canal and external ear normal. Tympanic membrane is not erythematous, retracted or bulging.     Nose: No mucosal edema or rhinorrhea.     Right Sinus: No maxillary sinus tenderness or frontal sinus tenderness.     Left Sinus: No maxillary sinus tenderness or frontal sinus tenderness.     Mouth/Throat:     Pharynx: Uvula midline.  Eyes:     General: Lids are normal. Lids are everted, no foreign bodies appreciated.     Conjunctiva/sclera: Conjunctivae normal.     Pupils: Pupils are equal, round, and reactive to light.  Neck:     Thyroid: No thyroid mass or thyromegaly.     Vascular: No carotid bruit.     Trachea: Trachea normal.  Cardiovascular:     Rate and Rhythm: Normal rate and regular rhythm.     Pulses: Normal pulses.     Heart sounds: Normal heart sounds, S1 normal and S2 normal. No murmur heard.   No friction rub. No gallop.  Pulmonary:     Effort: Pulmonary effort is normal. No tachypnea or respiratory distress.     Breath sounds: Normal breath sounds. No decreased breath sounds, wheezing, rhonchi or rales.  Abdominal:     General: Bowel sounds are normal.     Palpations: Abdomen is soft.     Tenderness: There is no abdominal tenderness.  Musculoskeletal:     Cervical back: Normal range of motion and neck supple.  Skin:    General: Skin is warm and dry.     Findings: No rash.   Neurological:     Mental Status: She is alert and oriented to person, place, and time. She is confused.     Cranial Nerves: Cranial nerves 2-12 are intact.     Sensory: Sensation is intact.     Motor: Motor function is intact.     Coordination: Coordination is intact.     Comments:  Pt repeats questions  asked moments prior. Appears confused about what happened at other appointments.   Psychiatric:        Attention and Perception: Attention normal.        Mood and Affect:  Mood is not anxious or depressed. Affect is flat.        Speech: Speech is delayed.        Behavior: Behavior is slowed. Behavior is cooperative.        Thought Content: Thought content normal. Thought content does not include homicidal or suicidal ideation. Thought content does not include suicidal plan.        Cognition and Memory: Cognition is impaired. She exhibits impaired recent memory.        Judgment: Judgment normal.      Results for orders placed or performed in visit on 08/08/21  C-reactive protein  Result Value Ref Range   CRP <1 0 - 10 mg/L  Sedimentation rate  Result Value Ref Range   Sed Rate 12 0 - 40 mm/hr    This visit occurred during the SARS-CoV-2 public health emergency.  Safety protocols were in place, including screening questions prior to the visit, additional usage of staff PPE, and extensive cleaning of exam room while observing appropriate contact time as indicated for disinfecting solutions.   COVID 19 screen:  No recent travel or known exposure to COVID19 The patient denies respiratory symptoms of COVID 19 at this time. The importance of social distancing was discussed today.   Assessment and Plan    Problem List Items Addressed This Visit     Cervicogenic headache    Will try a trial of meloxicam for headache. Consider referral to PMR for steroid injection in neck      Relevant Medications   meloxicam (MOBIC) 15 MG tablet   Major depressive disorder, recurrent episode,  moderate (HCC)   Memory loss - Primary    Neg lab eval, unremarkable MRI, pt denies depression, no focal neuro symptoms. Given rapid progression, I have recommended that sh return to see Dr. Krista Blue for further recommendations/evaluation.  Given sleep apnea like and possible contribute to cognitive difficulties.Marland KitchenMarland Kitchen I will contact pulmonary to try to facilitate setting up home sleep study.  Ms. Bester have limited social support. I have offered to make referral to social work for assistance with setting up disability or other services but pt is reluctant at this time. As given permission by patient, I will contact her manager at work to see if she can help Ms. billmeyer with remembering appointments etc.      Snoring    High likelihood of sleep apnea... will contact pulm to set up ( again) home sleep test.        Eliezer Lofts, MD

## 2021-09-17 NOTE — Assessment & Plan Note (Signed)
Will try a trial of meloxicam for headache. Consider referral to PMR for steroid injection in neck

## 2021-09-17 NOTE — Assessment & Plan Note (Signed)
Neg lab eval, unremarkable MRI, pt denies depression, no focal neuro symptoms. Given rapid progression, I have recommended that sh return to see Dr. Terrace Arabia for further recommendations/evaluation.  Given sleep apnea like and possible contribute to cognitive difficulties.Marland KitchenMarland Kitchen I will contact pulmonary to try to facilitate setting up home sleep study.  Ms. Wickliffe have limited social support. I have offered to make referral to social work for assistance with setting up disability or other services but pt is reluctant at this time. As given permission by patient, I will contact her manager at work to see if she can help Ms. tatlock with remembering appointments etc.

## 2021-09-17 NOTE — Assessment & Plan Note (Signed)
High likelihood of sleep apnea... will contact pulm to set up ( again) home sleep test.

## 2021-09-20 ENCOUNTER — Other Ambulatory Visit: Payer: Self-pay | Admitting: Adult Health

## 2021-09-20 DIAGNOSIS — G4719 Other hypersomnia: Secondary | ICD-10-CM

## 2021-09-21 ENCOUNTER — Telehealth: Payer: Self-pay | Admitting: Neurology

## 2021-09-21 DIAGNOSIS — R413 Other amnesia: Secondary | ICD-10-CM

## 2021-09-21 NOTE — Telephone Encounter (Signed)
Orders Placed This Encounter  Procedures   Ambulatory referral to Neuropsychology   Please call patient to move up her follow-up appointment in March 2023 with nurse practitioner,  If possible, try to expedite her neuropsychology referral she has increased difficulty handling her job, long history of depression in the past,

## 2021-09-21 NOTE — Telephone Encounter (Signed)
Mia Kline... would we have any other options to get this patient in to neuropsychology? Dr. Zannie Cove go to referral person Dr. Milbert Coulter can only get Mia Kline in August/Septemeber. She is having trouble functioning at work.

## 2021-09-21 NOTE — Telephone Encounter (Signed)
Noted, I left a message with Dr. Hazle Coca office with the answering services because they are out at lunch until 1 pm. I will try again.

## 2021-09-21 NOTE — Telephone Encounter (Signed)
Referral sent to Dr. Rosann Auerbach at [facility] [phone].

## 2021-09-21 NOTE — Telephone Encounter (Signed)
I spoke to the patient. She is agreeable to the neuropsychiatric referral. She has rescheduled her follow up with out office to 11/01/21.

## 2021-09-22 ENCOUNTER — Encounter: Payer: Self-pay | Admitting: Psychology

## 2021-09-22 NOTE — Addendum Note (Signed)
Addended by: Geronimo Running A on: 09/22/2021 11:19 AM   Modules accepted: Orders

## 2021-09-22 NOTE — Telephone Encounter (Signed)
Noted, faxed the referral.

## 2021-09-22 NOTE — Telephone Encounter (Signed)
Referral for neuropsychology placed for recommended office for quicker appointment.

## 2021-09-22 NOTE — Telephone Encounter (Signed)
This is a location that I have seen come up a few times. I have never sent a referral to them.   [facility] Dr. Corena Pilgrim - Psychiatrist [facility] [place] - Psychiatric Nurse Practitioner [address], [place], [place] Phone Number: [phone] Fax Number: [phone]  Possible Provider (no website though):  Joelyn Oms, MD Neurophysiologist in [place], [place] Address: [address], [place], [place] [id number] Phone: [phone]

## 2021-10-10 ENCOUNTER — Telehealth: Payer: Self-pay

## 2021-10-10 NOTE — Telephone Encounter (Signed)
Left message on voicemail for patient to call the office back. 

## 2021-10-10 NOTE — Telephone Encounter (Signed)
Pt has been taking the meloxicam for there headaches but it is not helping. Asking if she can take something else to help the pain and what else can she do. ?

## 2021-10-10 NOTE — Telephone Encounter (Signed)
Let pt know that headache pain is likely from neck pain issues... if she is agreeable I will refer her for possible steroid injeciton in her neck. ?

## 2021-10-10 NOTE — Telephone Encounter (Signed)
Left message for Mia Kline that Dr. Ermalene Searing feels that her headache pain is likely from neck pain issues... if she is agreeable I will refer her for possible steroid injeciton in her neck. I ask patient to call us back if she would like to move forward with the neck injection.  ?

## 2021-10-11 ENCOUNTER — Telehealth: Payer: Self-pay

## 2021-10-11 NOTE — Telephone Encounter (Signed)
Mia Kline the [facility] has tried to contact patient x2 to schedule HST.  ?

## 2021-10-11 NOTE — Telephone Encounter (Signed)
Left message for Trellis to return my call if she is interested in setting up the steroid injection for her neck. ?

## 2021-10-12 NOTE — Telephone Encounter (Signed)
Left message x 3.  No return calls.  Will close encounter.  FYI to Dr. Ermalene Searing.  ?

## 2021-10-13 ENCOUNTER — Other Ambulatory Visit: Payer: Self-pay | Admitting: Family Medicine

## 2021-10-13 NOTE — Telephone Encounter (Signed)
Is this okay to refill? 

## 2021-10-25 ENCOUNTER — Telehealth: Payer: Self-pay | Admitting: Family Medicine

## 2021-10-25 NOTE — Telephone Encounter (Signed)
See previous phone note regarding injection in neck. ? ?Encounter was closed, but I would like to try 1 last thing to try to get in contact with her.  Her manager at work is named Research scientist (physical sciences) and has helped her in the past with health issues.  Could you please try to contact the patient through [facility] at [phone].  She gave me permission to contact this manager at the last office visit. ?She has also not been answering the phone for setting up her sleep study.  See the pulmonary telephone note. ? ?I tried contacting Heather as well and did not get an answer  tonight. ?

## 2021-10-26 NOTE — Telephone Encounter (Signed)
Left message for Heather to return my call in regards to Dorlisa Alden.  ?

## 2021-10-28 NOTE — Telephone Encounter (Signed)
Left message for Herbert Seta to return my call in regards to Lynnell Jude.  ?

## 2021-10-28 NOTE — Telephone Encounter (Signed)
Left message x 2 for Heather to return my call in regards to Mia Kline.  No return call.  FYI to Dr. Ermalene Searing.  ?
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
## 2021-11-01 ENCOUNTER — Ambulatory Visit (INDEPENDENT_AMBULATORY_CARE_PROVIDER_SITE_OTHER): Payer: 59 | Admitting: Neurology

## 2021-11-01 ENCOUNTER — Encounter: Payer: Self-pay | Admitting: Neurology

## 2021-11-01 ENCOUNTER — Telehealth: Payer: Self-pay | Admitting: Neurology

## 2021-11-01 VITALS — BP 145/95 | HR 80 | Ht 67.0 in | Wt 208.0 lb

## 2021-11-01 DIAGNOSIS — R413 Other amnesia: Secondary | ICD-10-CM | POA: Diagnosis not present

## 2021-11-01 DIAGNOSIS — G43019 Migraine without aura, intractable, without status migrainosus: Secondary | ICD-10-CM

## 2021-11-01 DIAGNOSIS — R519 Headache, unspecified: Secondary | ICD-10-CM

## 2021-11-01 DIAGNOSIS — R0683 Snoring: Secondary | ICD-10-CM | POA: Diagnosis not present

## 2021-11-01 DIAGNOSIS — G4719 Other hypersomnia: Secondary | ICD-10-CM

## 2021-11-01 DIAGNOSIS — G8929 Other chronic pain: Secondary | ICD-10-CM

## 2021-11-01 NOTE — Telephone Encounter (Signed)
Mia Kline, patient is scheduled to see Dr. Milbert Coulter in August, anyway to get patient in sooner with another provider or Dr. Milbert Coulter, worsening memory trouble, impacting her job.  ?

## 2021-11-01 NOTE — Patient Instructions (Signed)
Will try to get you in sooner for neuro psych evaluation  ?Referral for sleep consult rule out obstructive sleep apnea ?Let me know about your medications if taking Effexor and Propranolol daily for headache prevention  ?See you back in 6 months  ?

## 2021-11-02 NOTE — Progress Notes (Signed)
Chart reviewed, agree above plan ?

## 2021-11-09 NOTE — Telephone Encounter (Signed)
Referral sent to Dr. Thedore Mins [phone]. ?

## 2021-11-10 ENCOUNTER — Other Ambulatory Visit: Payer: 59

## 2021-11-10 ENCOUNTER — Telehealth: Payer: Self-pay

## 2021-11-10 NOTE — Telephone Encounter (Signed)
[facility] Night - Client ?Nonclinical Telephone Record  ?AccessNurse? ?Client [facility] Night - Client ?Client Site [facility] - Night ?Provider Kerby Nora - MD ?Contact Type Call ?Who Is Calling Patient / Member / Family / Caregiver ?Caller Name Kylinn Shropshire ?Caller Phone Number [phone] ?Patient Name Mia Kline ?Patient DOB 05/24/1956 ?Call Type Message Only Information Provided ?Reason for Call Request for General Office Information ?Initial Comment Caller states that she received a message about getting a mammogram today. She would like to ?know if Dr. Ermalene Searing ordered this mammogram. ?Additional Comment Caller states that she would like a call back first thing this morning about whether or not ?the appointment for the mammogram was actually ordered by Dr. Ermalene Searing. She went to the ?only mammogram place that she knew to go to but they don't have anything for her. She is ?wondering if the initial message she got to report for her mammogram was a mistake. ?Disp. Time Disposition Final User ?11/10/2021 7:43:02 AM General Information Provided Yes Cherylynn Ridges ?Call Closed By: Cherylynn Ridges ?Transaction Date/Time: 11/10/2021 7:39:47 AM (ET ?

## 2021-11-10 NOTE — Telephone Encounter (Signed)
Left message for Ms. Benecke that she was actually scheduled for a Bone Density Test that Dr. Ermalene Searing did order. ?It was scheduled at [facility] in [place] for this morning 11/10/21 at 7:30 am.   Phone number and address provided for the [facility] and I ask patient to call and try to reschedule this appointment. FYI to Dr. Ermalene Searing.  ?

## 2021-12-08 ENCOUNTER — Ambulatory Visit: Payer: 59 | Admitting: Family Medicine

## 2021-12-09 ENCOUNTER — Ambulatory Visit: Payer: 59 | Admitting: Family Medicine

## 2021-12-12 NOTE — Telephone Encounter (Signed)
Received note back from [facility] that their office does not service memory loss. Declined referral.  ?

## 2021-12-16 ENCOUNTER — Encounter: Payer: Self-pay | Admitting: Family Medicine

## 2021-12-16 ENCOUNTER — Ambulatory Visit (INDEPENDENT_AMBULATORY_CARE_PROVIDER_SITE_OTHER): Payer: 59 | Admitting: Family Medicine

## 2021-12-16 VITALS — BP 130/78 | HR 72 | Ht 67.0 in | Wt 201.1 lb

## 2021-12-16 DIAGNOSIS — M797 Fibromyalgia: Secondary | ICD-10-CM

## 2021-12-16 DIAGNOSIS — G4719 Other hypersomnia: Secondary | ICD-10-CM

## 2021-12-16 DIAGNOSIS — R413 Other amnesia: Secondary | ICD-10-CM

## 2021-12-16 DIAGNOSIS — F331 Major depressive disorder, recurrent, moderate: Secondary | ICD-10-CM | POA: Diagnosis not present

## 2021-12-16 DIAGNOSIS — K219 Gastro-esophageal reflux disease without esophagitis: Secondary | ICD-10-CM

## 2021-12-16 DIAGNOSIS — G8929 Other chronic pain: Secondary | ICD-10-CM

## 2021-12-16 DIAGNOSIS — R519 Headache, unspecified: Secondary | ICD-10-CM | POA: Diagnosis not present

## 2021-12-16 DIAGNOSIS — I10 Essential (primary) hypertension: Secondary | ICD-10-CM

## 2021-12-16 DIAGNOSIS — E7800 Pure hypercholesterolemia, unspecified: Secondary | ICD-10-CM

## 2021-12-16 NOTE — Assessment & Plan Note (Signed)
Chronic, good control per the patient in the office today on her current dose of venlafaxine 150 mg daily.  She does have upcoming neuropsych testing scheduled in August to determine if a component of her memory is due to poor control of mood.  I did discuss in detail with the patient and her daughter about her isolation and loneliness and lack of support in [place].  They are looking into having her moved to [place] to live closer to her sons.

## 2021-12-16 NOTE — Patient Instructions (Addendum)
Work on low cholesterol , heart healthy diet, increase exercise, work on weight loss.  Return for cholesterol recheck in 3 months fasting labs   Continue atorvastatin  and HCTZ for now, with the goal of stopping eventually.  Can wean off of pantoprazole... 1/2 tablet daily  to every other day then off.  Set a timer of some kind for reminding of medication.

## 2021-12-16 NOTE — Assessment & Plan Note (Signed)
Chronic Work on low cholesterol , heart healthy diet, increase exercise, work on weight loss.  Return for cholesterol recheck in 3 months fasting labs   Continue atorvastatin  and HCTZ for now, with the goal of stopping eventually.

## 2021-12-16 NOTE — Progress Notes (Signed)
Patient ID: Mia Kline, female    DOB: 1956/01/12, 66 y.o.   MRN: [id number]  This visit was conducted in person. BP 130/78   Pulse 72   Ht 5\' 7"  (1.702 m)   Wt 201 lb 1.6 oz (91.2 kg)   SpO2 96%   BMI 31.50 kg/m    CC:  Chief Complaint  Patient presents with   Memory Loss    Going on for a while, and notice over the last few months she notice that is has gotten worse , forget to take medication at time since daughter    Subjective:   HPI: Mia Kline is a 66 y.o. female presenting on 12/16/2021 for Memory Loss (Going on for a while, and notice over the last few months she notice that is has gotten worse , forget to take medication at time since daughter )   Reviewed nurse note from 12/18/2021 in [facility].  She was seen April 24 for episodic lightheadedness.  Social reviewed office visit from [facility] by [facility], NP on November 01, 2021.  She is followed by Dr. November 03, 2021. She has been scheduled for neuropsychological evaluation in August and they will look to see if that can be moved up given cognitive impairment is impacting daily life.  MOCA that day was 18/30 She had also been having headaches, sed rate and CRP were normal, MRI of the brain with/without contrast was normal..  On Inderal LA and Effexor XR. We again placed an order for a sleep consult to rule out sleep apnea given snoring and daytime fatigue.   Today she reports continuing worsening of her memory that is impacting her daily life and job ( difficulty learning new skills, misplace things, word finding difficulties).  She has forgotten to take her meditation at times.  She has minimal support at home. She is leaving work given she is unable to function.September ie early retirement... Sept 29, 2023.  She reports her depression is well controlled.   She is here with her daughter today... visiting from 08-23-1985.  She has noted significant issue with functioning given  forgetting logins, finance  issues. She has been forgetting to take the meds.  The 10-year ASCVD risk score (Arnett DK, et al., 2019) is: 8%   Values used to calculate the score:     Age: 64 years     Sex: Female     Is Non-Hispanic African American: No     Diabetic: No     Tobacco smoker: No     Systolic Blood Pressure: 130 mmHg     Is BP treated: Yes     HDL Cholesterol: 71.4 mg/dL     Total Cholesterol: 214 mg/dL  Wt Readings from Last 3 Encounters:  12/16/21 201 lb 1.6 oz (91.2 kg)  11/01/21 208 lb (94.3 kg)  09/16/21 213 lb 7 oz (96.8 kg)   BP Readings from Last 3 Encounters:  12/16/21 130/78  11/01/21 (!) 145/95  09/16/21 130/90    Relevant past medical, surgical, family and social history reviewed and updated as indicated. Interim medical history since our last visit reviewed. Allergies and medications reviewed and updated. Outpatient Medications Prior to Visit  Medication Sig Dispense Refill   atorvastatin (LIPITOR) 40 MG tablet Take 1 tablet (40 mg total) by mouth daily. 90 tablet 3   B Complex-C (SUPER B COMPLEX PO) Take 1 tablet by mouth daily.     Calcium Carbonate-Vitamin D (CALCIUM 600+D3 PO) Take 2 tablets  by mouth daily.     Cholecalciferol (VITAMIN D-3) 1000 UNITS CAPS Take 1 capsule by mouth daily.     Cholecalciferol (VITAMIN D3) 1.25 MG (50000 UT) CAPS Take 1 capsule by mouth every 7 (seven) days. 12 capsule 0   hydrochlorothiazide (HYDRODIURIL) 25 MG tablet TAKE 1 TABLET BY MOUTH  DAILY 90 tablet 1   Magnesium 400 MG TABS Take by mouth.     meloxicam (MOBIC) 15 MG tablet TAKE 1 TABLET (15 MG TOTAL) BY MOUTH DAILY. 30 tablet 3   nystatin cream (MYCOSTATIN) Apply 1 application topically 2 (two) times daily. 30 g 0   pantoprazole (PROTONIX) 40 MG tablet TAKE 1 TABLET BY MOUTH  DAILY 90 tablet 3   propranolol ER (INDERAL LA) 80 MG 24 hr capsule Take 1 capsule (80 mg total) by mouth daily. 30 capsule 11   triamcinolone cream (KENALOG) 0.1 % Apply 1 application topically 2 (two) times  daily. Apply to affected area twice daily (rash) 15 g 0   venlafaxine XR (EFFEXOR-XR) 150 MG 24 hr capsule TAKE 1 CAPSULE BY MOUTH  DAILY WITH BREAKFAST 90 capsule 1   amoxicillin (AMOXIL) 500 MG tablet 2g 1hr prior to dental procedure (Patient not taking: Reported on 11/01/2021)     No facility-administered medications prior to visit.     Per HPI unless specifically indicated in ROS section below Review of Systems  Constitutional:  Negative for fatigue and fever.  HENT:  Negative for congestion.   Eyes:  Negative for pain.  Respiratory:  Negative for cough and shortness of breath.   Cardiovascular:  Negative for chest pain, palpitations and leg swelling.  Gastrointestinal:  Negative for abdominal pain.  Genitourinary:  Negative for dysuria and vaginal bleeding.  Musculoskeletal:  Negative for back pain.  Neurological:  Negative for syncope, light-headedness and headaches.  Psychiatric/Behavioral:  Negative for dysphoric mood.   Objective:  BP 130/78   Pulse 72   Ht  (1.702 m)   Wt 201 lb 1.6 oz (91.2 kg)   SpO2 96%   BMI 31.50 kg/m   Wt Readings from Last 3 Encounters:  12/16/21 201 lb 1.6 oz (91.2 kg)  11/01/21 208 lb (94.3 kg)  09/16/21 213 lb 7 oz (96.8 kg)      Physical Exam Constitutional:      General: She is not in acute distress.    Appearance: Normal appearance. She is well-developed. She is not ill-appearing or toxic-appearing.  HENT:     Head: Normocephalic.     Right Ear: Hearing, tympanic membrane, ear canal and external ear normal. Tympanic membrane is not erythematous, retracted or bulging.     Left Ear: Hearing, tympanic membrane, ear canal and external ear normal. Tympanic membrane is not erythematous, retracted or bulging.     Nose: No mucosal edema or rhinorrhea.     Right Sinus: No maxillary sinus tenderness or frontal sinus tenderness.     Left Sinus: No maxillary sinus tenderness or frontal sinus tenderness.     Mouth/Throat:     Pharynx: Uvula  midline.  Eyes:     General: Lids are normal. Lids are everted, no foreign bodies appreciated.     Conjunctiva/sclera: Conjunctivae normal.     Pupils: Pupils are equal, round, and reactive to light.  Neck:     Thyroid: No thyroid mass or thyromegaly.     Vascular: No carotid bruit.     Trachea: Trachea normal.  Cardiovascular:     Rate and Rhythm: Normal  rate and regular rhythm.     Pulses: Normal pulses.     Heart sounds: Normal heart sounds, S1 normal and S2 normal. No murmur heard.   No friction rub. No gallop.  Pulmonary:     Effort: Pulmonary effort is normal. No tachypnea or respiratory distress.     Breath sounds: Normal breath sounds. No decreased breath sounds, wheezing, rhonchi or rales.  Abdominal:     General: Bowel sounds are normal.     Palpations: Abdomen is soft.     Tenderness: There is no abdominal tenderness.  Musculoskeletal:     Cervical back: Normal range of motion and neck supple.  Skin:    General: Skin is warm and dry.     Findings: No rash.  Neurological:     Mental Status: She is alert.     Cranial Nerves: Cranial nerves 2-12 are intact.     Sensory: Sensation is intact.     Motor: Motor function is intact.     Coordination: Coordination is intact.  Psychiatric:        Mood and Affect: Mood is not anxious or depressed. Affect is blunt.        Speech: Speech normal.        Behavior: Behavior normal. Behavior is cooperative.        Thought Content: Thought content normal.        Cognition and Memory: Cognition is impaired. Memory is impaired. She exhibits impaired recent memory.        Judgment: Judgment normal.      Results for orders placed or performed in visit on 08/08/21  C-reactive protein  Result Value Ref Range   CRP <1 0 - 10 mg/L  Sedimentation rate  Result Value Ref Range   Sed Rate 12 0 - 40 mm/hr     COVID 19 screen:  No recent travel or known exposure to COVID19 The patient denies respiratory symptoms of COVID 19 at this  time. The importance of social distancing was discussed today.   Assessment and Plan  Ms. Mia Kline presented with her daughter today.  All her medications and upcoming appointments as recommended by neurology and pulmonary were reviewed in detail.  Problem List Items Addressed This Visit     Chronic nonintractable headache   Essential hypertension, benign    Chronic, well controlled We will continue hydrochlorothiazide at this time but her daughter would like her to minimize medications as much as able.  If she has significant weight loss and works on heart healthy diet we can consider stopping HCTZ in the future.       Excessive daytime sleepiness    She may have sleep apnea and has missed previous sleep testing visits with pulmonary. I have encouraged her to move forward with this and we will make sure that the daughter is notified so that she can have a reminder to get to the appointment.  Sleep apnea can contribute to fatigue and memory issues.       Fibromyalgia    We will stop the meloxicam as her daughter would like her to try turmeric to help with inflammation and Tylenol for pain as needed.       GERD    Can wean off of pantoprazole... 1/2 tablet daily  to every other day then off.      Hyperlipidemia    Chronic Work on low cholesterol , heart healthy diet, increase exercise, work on weight loss.  Return for cholesterol recheck in  3 months fasting labs   Continue atorvastatin  and HCTZ for now, with the goal of stopping eventually.      Major depressive disorder, recurrent episode, moderate (HCC) - Primary    Chronic, good control per the patient in the office today on her current dose of venlafaxine 150 mg daily.  She does have upcoming neuropsych testing scheduled in August to determine if a component of her memory is due to poor control of mood.  I did discuss in detail with the patient and her daughter about her isolation and loneliness and lack of support in  [place].  They are looking into having her moved to [place] to live closer to her sons.       Memory loss    Chronic, worsening  Per the most recent neuro note reviewed in detail with the patient and her daughter, her memory testing is worsening.  I encouraged her to keep an appointment for neurocognitive testing as well as follow-up with neurology. We discussed ways to problem solve her forgetting to take her medications.  We minimized her medication list to facilitate medication administration.  She has a daily pillbox set up.  We discussed setting timers/alarms/calling her daily to make sure she takes her medication.         Kerby Nora, MD

## 2021-12-16 NOTE — Assessment & Plan Note (Signed)
Can wean off of pantoprazole... 1/2 tablet daily  to every other day then off.

## 2021-12-16 NOTE — Assessment & Plan Note (Signed)
She may have sleep apnea and has missed previous sleep testing visits with pulmonary. I have encouraged her to move forward with this and we will make sure that the daughter is notified so that she can have a reminder to get to the appointment.  Sleep apnea can contribute to fatigue and memory issues.

## 2021-12-16 NOTE — Assessment & Plan Note (Signed)
Chronic, well controlled We will continue hydrochlorothiazide at this time but her daughter would like her to minimize medications as much as able.  If she has significant weight loss and works on heart healthy diet we can consider stopping HCTZ in the future.

## 2021-12-16 NOTE — Assessment & Plan Note (Signed)
We will stop the meloxicam as her daughter would like her to try turmeric to help with inflammation and Tylenol for pain as needed.

## 2021-12-16 NOTE — Assessment & Plan Note (Addendum)
Chronic, worsening  Per the most recent neuro note reviewed in detail with the patient and her daughter, her memory testing is worsening.  I encouraged her to keep an appointment for neurocognitive testing as well as follow-up with neurology. We discussed ways to problem solve her forgetting to take her medications.  We minimized her medication list to facilitate medication administration.  She has a daily pillbox set up.  We discussed setting timers/alarms/calling her daily to make sure she takes her medication.

## 2021-12-28 IMAGING — DX DG CERVICAL SPINE COMPLETE 4+V
6 series · 6 of 6 positions shown · non-contrast
Comparison: None.

CLINICAL DATA: Neck pain

EXAM:
CERVICAL SPINE - COMPLETE 4+ VIEW

[c-spine lat]
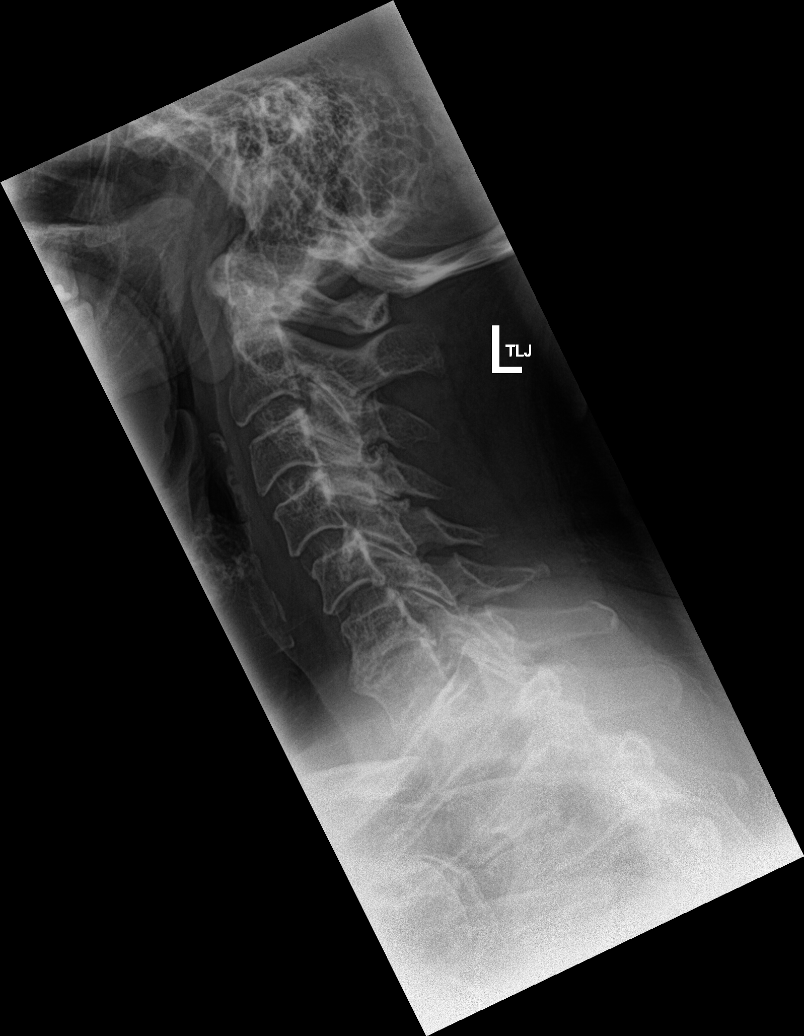

[c-spine obl (1 of 2)]
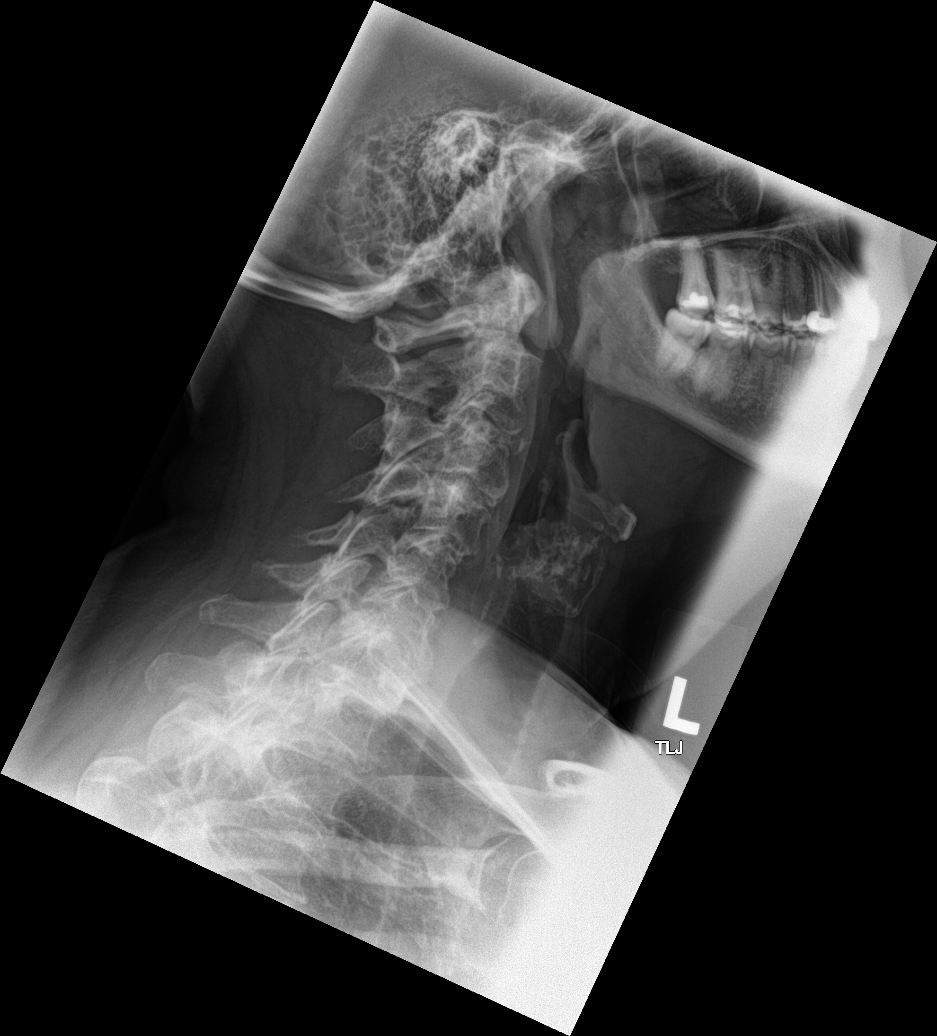

[c-spine obl (2 of 2)]
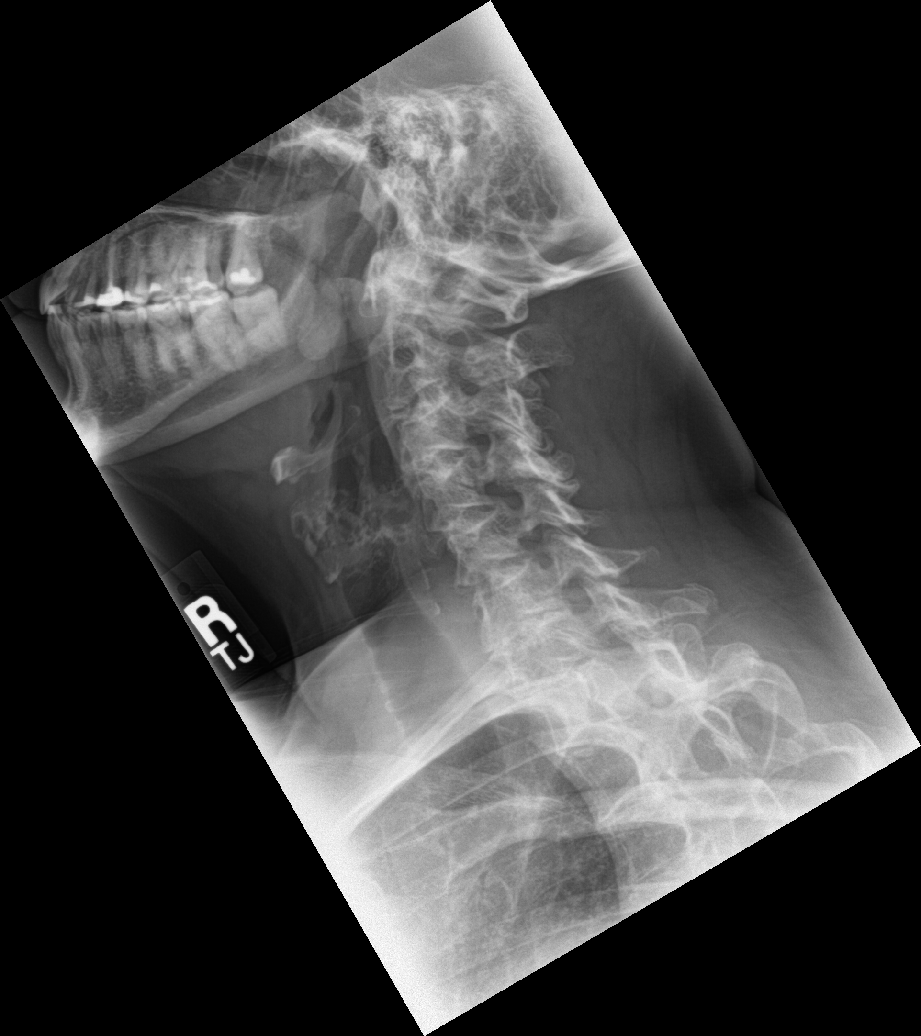

[c-spine ap]
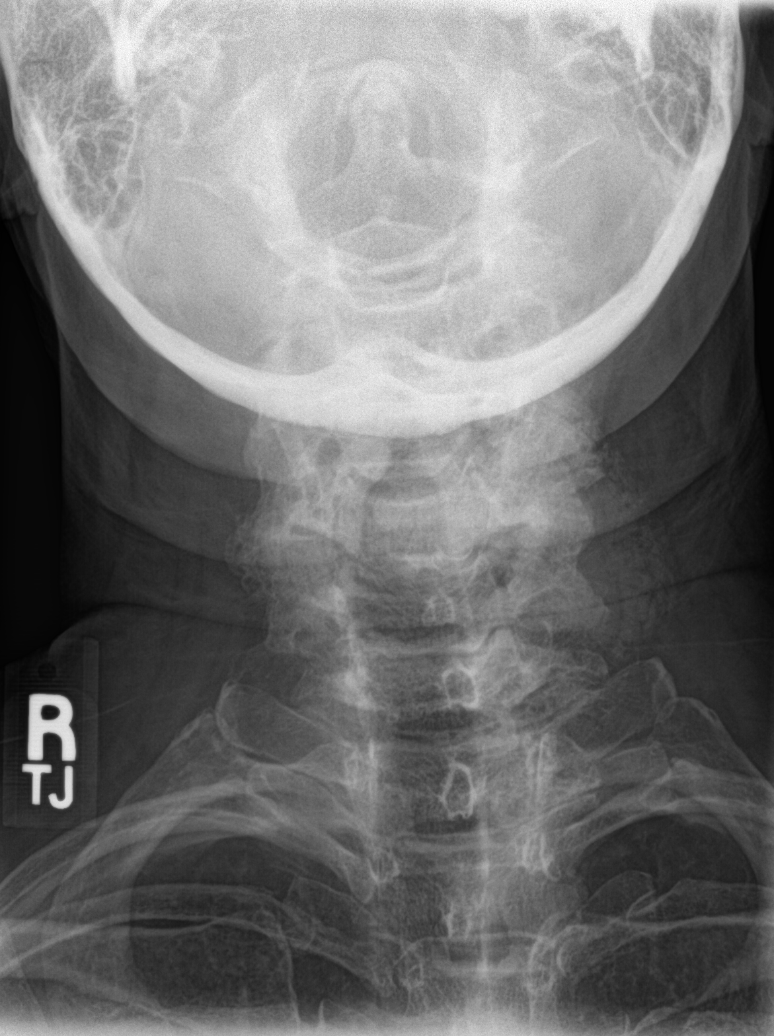

[c-spine open mouth]
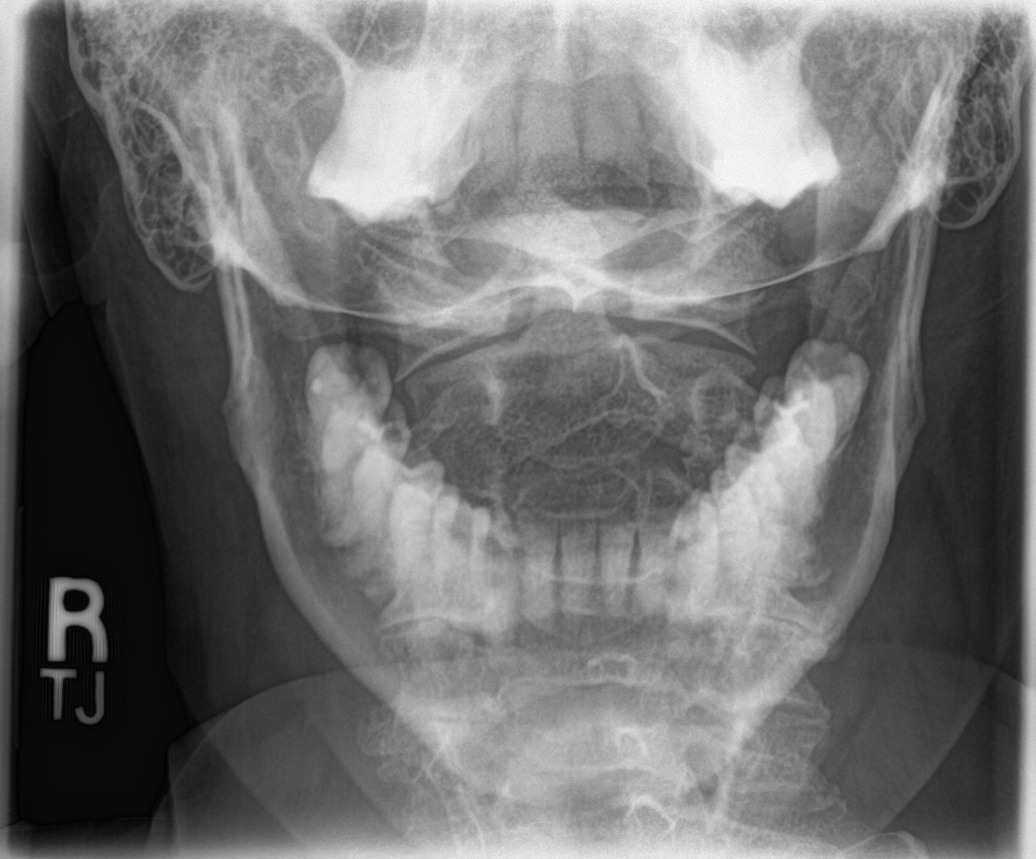

[c-spine swimmers]
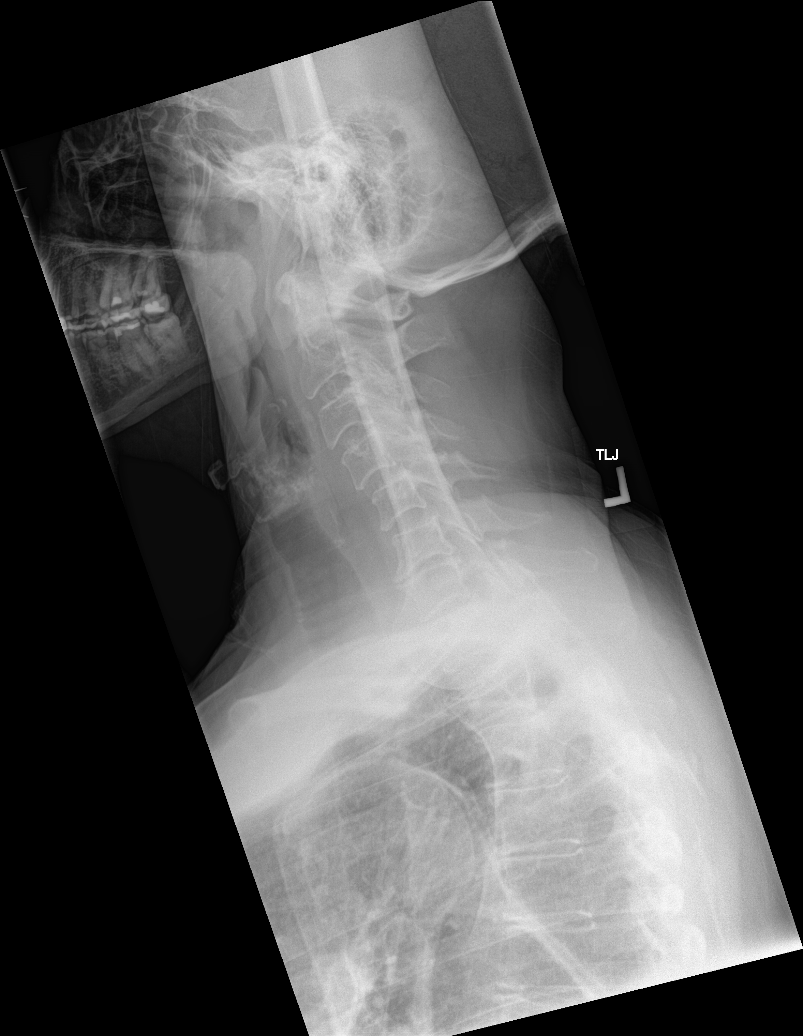

[6 of 6 positions shown; findings below may reference images not displayed]

FINDINGS: The cervical spine is visualized from C1-C7. Cervical alignment is
maintained. Vertebral body heights are maintained: no evidence of
acute fracture. Moderate intervertebral disc space height loss
posterior disc osteophyte complex at C6-7. Multilevel osseous
neuroforaminal narrowing secondary to multilevel facet and
uncovertebral hypertrophy. This is most pronounced in the mid and
lower cervical spine with moderate to severe osseous neuroforaminal
narrowing at RIGHT C3-4, C4-5, C5-6 and C6-7. No prevertebral soft
tissue swelling. Visualized thorax is unremarkable.
IMPRESSION: Multilevel degenerative changes in the cervical spine with moderate
to severe osseous neuroforaminal narrowing at RIGHT C3-4, C4-5, C5-6
and C6-7.

## 2022-01-18 NOTE — Telephone Encounter (Signed)
Resent referral for Neuropsychology to Dr. Peter Stewart [phone]. 

## 2022-02-14 ENCOUNTER — Ambulatory Visit: Payer: 59 | Admitting: Neurology

## 2022-03-09 ENCOUNTER — Telehealth: Payer: Self-pay | Admitting: Family Medicine

## 2022-03-09 NOTE — Telephone Encounter (Signed)
Patient called in stating she has a rash on her belly that is flaring up. Patient wants to know if something could be called in for. Thank you!

## 2022-03-09 NOTE — Telephone Encounter (Signed)
Unable to treat a rash without being seen.  Please call and schedule an appointment.

## 2022-03-10 NOTE — Telephone Encounter (Signed)
LVM for patient to return call to be scheduled.

## 2022-03-20 ENCOUNTER — Ambulatory Visit (INDEPENDENT_AMBULATORY_CARE_PROVIDER_SITE_OTHER): Payer: 59 | Admitting: Psychology

## 2022-03-20 ENCOUNTER — Ambulatory Visit: Payer: 59 | Admitting: Psychology

## 2022-03-20 ENCOUNTER — Encounter: Payer: Self-pay | Admitting: Psychology

## 2022-03-20 DIAGNOSIS — G4486 Cervicogenic headache: Secondary | ICD-10-CM | POA: Diagnosis not present

## 2022-03-20 DIAGNOSIS — R4189 Other symptoms and signs involving cognitive functions and awareness: Secondary | ICD-10-CM

## 2022-03-20 DIAGNOSIS — F411 Generalized anxiety disorder: Secondary | ICD-10-CM | POA: Diagnosis not present

## 2022-03-20 DIAGNOSIS — M797 Fibromyalgia: Secondary | ICD-10-CM | POA: Diagnosis not present

## 2022-03-20 DIAGNOSIS — F410 Panic disorder [episodic paroxysmal anxiety] without agoraphobia: Secondary | ICD-10-CM

## 2022-03-20 NOTE — Progress Notes (Signed)
NEUROPSYCHOLOGICAL EVALUATION [place] Department of Neurology  Date of Evaluation: March 20, 2022  Reason for Referral:   Mia Kline is a 66 y.o. right-handed Caucasian female referred by  Mia Kline, M.D. , to characterize her current cognitive functioning and assist with diagnostic clarity and treatment planning in the context of subjective cognitive decline.   Assessment and Plan:   Clinical Impression(s): During testing, task engagement was poor. Mia Kline was noted to frequently make comments surrounding headache symptoms, vision impairment (despite denying vision difficulties during interview), photophobia, spinning sensation, hunger, and generalized anxiety. She was given several prompts and opportunities to discontinue the evaluation and return another day. However, she denied each offer and persisted, stating "everyday I deal with this." Performances across stand-alone and embedded performance validity measures were consistently below expectation. For the most part, below expectation performances were not below chance or suspect for malingering. It appears more likely that the impact of the aforementioned variables impacted her ability to focus and put forth optimal effort across cognitive tasks. Overall, the results of the current evaluation (which would otherwise suggest diffuse and severe impairment across several cognitive domains) should not be interpreted at face value and impaired performances likely underestimate true cognitive abilities to an unknown degree.   The etiology for ongoing subjective dysfunction is unclear. When tested previously in 2012, she performed very well and there was suspicion that psychiatric factors may be playing a larger role in ongoing functioning. She appeared very flat and dysphoric during the current appointment and was a fairly limited historian. She reported symptoms of both depression and anxiety with  infrequent panic attacks, as well as headaches and chronic pain/fibromyalgia symptoms. Taken together, these variables can certainly yield cognitive and functional dysfunction, especially in more severe cases.   Neurologically speaking, interpretation is very limited given an unremarkable brain MRI and ongoing validity concerns. If testing patterns were taken at face value, this would represent severe decline since 2012, amnestic memory, and it would be surprising that Mia Kline would still be able to work or function independently.  Recommendations: If there is continued concern for an underlying neurodegenerative process, I would recommend that she discuss additional testing in the form of a PET scan and lumbar puncture with her neurologist. These tests provide different information from a standard brain MRI and can be useful in identifying concerns for early-onset Alzheimer's disease or other illnesses when testing yields validity concerns.   A repeat neuropsychological evaluation in 12-18 months is recommended to more clearly assess cognitive dysfunction, ideally in the absence of all symptoms which Mia Kline described being present during testing.  A combination of medication and psychotherapy has been shown to be most effective at treating symptoms of anxiety and depression. As such, Mia Kline is encouraged to speak with her prescribing physician regarding medication adjustments to optimally manage these symptoms. Likewise, Mia Kline is encouraged to consider engaging in short-term psychotherapy to address symptoms of psychiatric distress. She would benefit from an active and collaborative therapeutic environment, rather than one purely supportive in nature. Recommended treatment modalities include Cognitive Behavioral Therapy (CBT) or Acceptance and Commitment Therapy (ACT).  Performance across neurocognitive testing is not a strong predictor of an individual's safety operating a motor vehicle.  Should her family wish to pursue a formalized driving evaluation, they could reach out to the following agencies: [employer] in [place]: [phone] [facility]: [phone] [facility]: [phone] [facility]: [phone] or [phone]  Ms. Malwitz is encouraged to attend to lifestyle factors for brain health (e.g., regular physical exercise, good nutrition habits, regular participation in cognitively-stimulating activities, and general stress management techniques), which are likely to have benefits for both emotional adjustment and cognition. In fact, in addition to promoting good general health, regular exercise incorporating aerobic activities (e.g., brisk walking, jogging, cycling, etc.) has been demonstrated to be a very effective treatment for depression and stress, with similar efficacy rates to both antidepressant medication and psychotherapy. Optimal control of vascular risk factors (including safe cardiovascular exercise and adherence to dietary recommendations) is encouraged. Continued participation in activities which provide mental stimulation and social interaction is also recommended.   When learning new information, she would benefit from information being broken up into small, manageable pieces. She may also find it helpful to articulate the material in her own words and in a context to promote encoding at the onset of a new task. This material may need to be repeated multiple times to promote encoding.  Memory can be improved using internal strategies such as rehearsal, repetition, chunking, mnemonics, association, and imagery. External strategies such as written notes in a consistently used memory journal, visual and nonverbal auditory cues such as a calendar on the refrigerator or appointments with alarm, such as on a cell phone, can also help maximize recall.    To address problems with processing speed, she may wish to  consider:   -Ensuring that she is alerted when essential material or instructions are being presented   -Adjusting the speed at which new information is presented   -Allowing for more time in comprehending, processing, and responding in conversation  To address problems with fluctuating attention and executive dysfunction, she may wish to consider:   -Avoiding external distractions when needing to concentrate   -Limiting exposure to fast paced environments with multiple sensory demands   -Writing down complicated information and using checklists   -Attempting and completing one task at a time (i.e., no multi-tasking)   -Verbalizing aloud each step of a task to maintain focus   -Reducing the amount of information considered at one time  Review of Records:   Ms. Slayback was seen by [facility] Fabio Asa, M.D.) on 03/28/2011 for an evaluation of memory loss. Examples included trouble keeping track of tasks at home and forgetting how to use computer software functions at work. She also reported trouble with headaches, dizzy spells, and psychiatric distress. Brain MRI on 03/07/2011 was unremarkable. Routine EEG on 03/30/2011 was normal.  She completed a comprehensive neuropsychological evaluation Antionette Fairy, Ph.D.) on 04/27/2011. Results suggested fully intact neuropsychological functioning with no areas of impairment (including intact memory abilities). She did report elevated levels of depression, anxiety, and somatic preoccupation. It was thought that these factors were most directly related to subjective cognitive dysfunction in her day-to-day life.   She was next seen by [facility] Marcial Pacas, M.D.) on 08/08/2021 for an evaluation of memory concerns. She was still working at that time. However, she described trouble learning new tasks/routines, misplacing things more frequently, and word finding difficulties. Dysfunction was said to be progressive. Performance on a brief  cognitive screening instrument (MOCA) was 20/30. Ultimately, Ms. Azzarello was referred for a comprehensive neuropsychological evaluation to characterize her cognitive abilities and to assist with diagnostic clarity and treatment planning.   Brain MRI on 08/18/2021 was unremarkable.  She was seen in the ED on 02/27/2022 via EMS after slipping and falling while at work. She did experience a  direct head impact but denied a loss in consciousness or any concussion symptoms. Head CT was negative.   Past Medical History:  Diagnosis Date   Abdominal pain 02/12/2008   Acute cystitis    Acute sinusitis    Anemia, iron deficiency 02/12/2008   Arm pain, left    Arthritis of knee 07/02/2017   Candidiasis, intertrigo 05/18/2020   Cervicogenic headache 06/01/2020   Chronic cough 10/02/2008   Chronic nonintractable headache 08/08/2021   Common migraine    Complication of anesthesia    Elbow pain    Essential hypertension, benign 02/12/2008   Excessive daytime sleepiness 06/14/2021   Fatigue 01/12/2009   Fibromyalgia 06/26/2011   Generalized anxiety disorder with panic attacks 07/01/2015   Genital herpes 02/12/2008   GERD (gastroesophageal reflux disease) 02/12/2008   Heart murmur 02/12/2008   Hyperlipidemia    Internal hemorrhoids 02/10/2009   Irritable bowel syndrome 02/10/2009   Knee pain, left    Low back pain with bilateral sciatica 11/28/2016   Major depressive disorder 02/12/2008   Microscopic hematuria 04/15/2010   Multiple joint pain 11/28/2016   Neck pain 06/01/2020   Osteoarthritis 02/12/2008   Palpitations 09/28/2009   Paresthesia 09/28/2009   Plantar fasciitis, bilateral 03/28/2013   PONV (postoperative nausea and vomiting)    Prediabetes 05/13/2019   Routine general medical examination at a health care facility    Small bowel obstruction    Snoring 08/08/2021   Subjective memory complaints 07/31/2010   Tinnitus 07/01/2015   Urinary tract infection     Past Surgical  History:  Procedure Laterality Date   ABDOMINAL HYSTERECTOMY     Barium Follow Through  2007   because rectum twisted and couldn't do colonoscopy, was painful though   barium swallow  2006   Hiatal hernia   BILATERAL OOPHORECTOMY  2006   For large cyst   CARDIOVASCULAR STRESS TEST  09/2009   Low risk   PARTIAL HYSTERECTOMY  1998   Vaginal   TOTAL KNEE ARTHROPLASTY Left 07/02/2017   Procedure: LEFT TOTAL KNEE ARTHROPLASTY;  Surgeon: Meredith Pel, MD;  Location: [place];  Service: Orthopedics;  Laterality: Left;   TUBAL LIGATION      Current Outpatient Medications:    atorvastatin (LIPITOR) 40 MG tablet, Take 1 tablet (40 mg total) by mouth daily., Disp: 90 tablet, Rfl: 3   B Complex-C (SUPER B COMPLEX PO), Take 1 tablet by mouth daily., Disp: , Rfl:    Calcium Carbonate-Vitamin D (CALCIUM 600+D3 PO), Take 2 tablets by mouth daily., Disp: , Rfl:    hydrochlorothiazide (HYDRODIURIL) 25 MG tablet, TAKE 1 TABLET BY MOUTH  DAILY, Disp: 90 tablet, Rfl: 1   nystatin cream (MYCOSTATIN), Apply 1 application topically 2 (two) times daily., Disp: 30 g, Rfl: 0   propranolol ER (INDERAL LA) 80 MG 24 hr capsule, Take 1 capsule (80 mg total) by mouth daily., Disp: 30 capsule, Rfl: 11   venlafaxine XR (EFFEXOR-XR) 150 MG 24 hr capsule, TAKE 1 CAPSULE BY MOUTH  DAILY WITH BREAKFAST, Disp: 90 capsule, Rfl: 1  Clinical Interview:   The following information was obtained during a clinical interview with Ms. Gari prior to cognitive testing.  Cognitive Symptoms: Decreased short-term memory: Endorsed. Examples included trouble recalling "what I'm supposed to do" and trouble "getting work done." She reported "sometimes" having trouble recalling recent conversations or names of familiar individuals.  Decreased long-term memory: Denied. Decreased attention/concentration: Endorsed. She reported trouble with sustained attention and increased distractibility.  Reduced processing speed:  Endorsed. Difficulties with executive functions: Endorsed. She reported trouble with multi-tasking and organization. She generally denied trouble with impulsivity or any significant personality changes.  Difficulties with emotion regulation: Denied. Difficulties with receptive language: Denied. Difficulties with word finding: Endorsed. Decreased visuoperceptual ability: Denied.  Trajectory of deficits: Per Ms. Amaral, she described cognitive dysfunction since the COVID-19 pandemic which seems to have progressively worsened over time. When it was brought to her attention that she had concerns about memory in 2012 and completed a prior neuropsychological evaluation, she stated "yeah, probably" and denied any recollection of completing prior testing.   Difficulties completing ADLs: Denied.  Additional Medical History: History of traumatic brain injury/concussion: Unclear. She was very vague and a limited historian when describing her medical history. She stated that she "perhaps" hit her head in the past but could not recall any details. She reported slipping while at work and hitting her head recently but was also unable to provide any details. Medical records suggest no loss in consciousness and a negative head CT.  History of stroke: Denied. History of seizure activity: Denied. History of known exposure to toxins: Denied. Symptoms of chronic pain: Endorsed. She reported ongoing knee pain. Medical records suggest prior pain complaints surrounding numerous bodily regions. It also describes a history of fibromyalgia.  Experience of frequent headaches/migraines: Endorsed. She was vague, at one point stating that she wakes up with headaches daily, and then at another time stating that she can go weeks without headache symptoms. The ultimate frequency of these events was unable to be determined.  Frequent instances of dizziness/vertigo: Denied. She did describe some symptoms when standing quickly or  abruptly changing her positioning.   Sensory changes: Denied.  Balance/coordination difficulties: Unclear. She was vague when describing balance concerns. While reporting occasional mild instability, she denied ongoing weakness or other factors which would influence these behaviors.  Other motor difficulties: Denied.  Sleep History: Estimated hours obtained each night: Unclear. She described her recent sleep patterns as "fine I guess." Difficulties falling asleep: Denied. Medical records do suggest chronic insomnia.  Difficulties staying asleep: Denied. Feels rested and refreshed upon awakening: "I guess so."  History of snoring: Endorsed. History of waking up gasping for air: Denied. Witnessed breath cessation while asleep: Denied. Medical records mention a prior attempt at completing a sleep study. However, this was inconclusive as Ms. Zhou was unable to fall asleep.   History of vivid dreaming: Denied. Excessive movement while asleep: Denied. Instances of acting out her dreams: Denied.  Psychiatric/Behavioral Health History: Depression: Endorsed. When asked about her current mood, she responded "I don't know, there's a lot of change going on right now." She acknowledged a longstanding history of depression and was not knowledgeable surrounding current medication intervention for said symptoms. Current or remote suicidal ideation, intent, or plan was denied.   Anxiety: Endorsed. She reported ongoing generalized anxiety and "every once in a while" experiencing possible panic attacks.  Mania: Denied. Trauma History: Denied. Visual/auditory hallucinations: Denied. Delusional thoughts: Denied.  Tobacco: Denied. Alcohol: She denied current alcohol consumption as well as a history of problematic alcohol abuse or dependence.  Recreational drugs: Denied.  Family History: Problem Relation Age of Onset   Cancer Mother        Breast   Cancer Father        Lung   Heart disease Father         CABG   Colon polyps Sister    Cancer Maternal Aunt  Cirrhosis of liver   This information was confirmed by Ms. Juline Patch.  Academic/Vocational History: Highest level of educational attainment: 12 years. She graduated high school. She was unable to describe academic performance in school. She alluded to poor performances due to stress surrounding her mother's health at the time. She did not report any trouble learning to read or perform basic math in early academic settings.  History of developmental delay: Denied. History of grade repetition: Denied. Enrollment in special education courses: Denied. History of LD/ADHD: Denied.  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 She noted that she is being forced into retirement in the near future, assumedly due to memory lapses and other cognitive difficulties affecting overall work Systems analyst.   Evaluation Results:   Behavioral Observations: Ms. Komm was unaccompanied, arrived to her appointment on time, and was appropriately dressed and groomed. She appeared alert but was a very limited historian. Observed gait and station were within normal limits. Gross motor functioning appeared intact upon informal observation and no abnormal movements (e.g., tremors) were noted. Her affect was extremely flat and she appeared dysphoric throughout the clinical interview.  Spontaneous speech was fluent and word finding difficulties were not observed during the clinical interview. Thought processes were coherent, organized, and normal in content. Insight into her cognitive difficulties was unable to be accurately determined given performance validity concerns (see below).   During testing, task engagement was poor. Ms. Raden was noted to frequently make comments surrounding headache symptoms, vision impairment (despite denying vision difficulties during interview), photophobia,  spinning sensation, hunger, and generalized anxiety. She was given several prompts and opportunities to discontinue the evaluation and return another day. However, she denied each offer and persisted, stating "everyday I deal with this."  Adequacy of Effort: The validity of neuropsychological testing is limited by the extent to which the individual being tested may be assumed to have exerted adequate effort during testing. Ms. Figuereo expressed her intention to perform to the best of her abilities and exhibited adequate task engagement and persistence. Scores across stand-alone and embedded performance validity measures were consistently below expectation. While I do not have evidence to suggest that Ms. Horgen was engaging in malingering behaviors, the results of the current evaluation should not be interpreted at face value and there is a good likelihood that lower performances are not reflective of true cognitive abilities.  Test Results: As stated above, test results are believed to be invalid. They are outlined below for the purposes of a completed evaluation record.   Ms. Bunten was largely oriented at the time of the current evaluation. She was unable to state the current date ("20 something").   Intellectual abilities based upon educational and vocational attainment were estimated to be in the average range. Premorbid abilities were estimated to be within the average range based upon a single-word reading test.   Processing speed was exceptionally low to below average. Basic attention was average. More complex attention (e.g., working memory) was well below average to average. Executive functioning was exceptionally low to well below average.  Assessed receptive language abilities were average. Likewise, Ms. Koellner did not exhibit any difficulties comprehending task instructions and answered all questions asked of her during interview appropriately. Assessed expressive language was variable.  Phonemic fluency was well below average, semantic fluency was below average, and confrontation naming was above average.      Assessed visuospatial/visuoconstructional abilities were variable, ranging from the exceptionally low to above  average normative ranges. She was confused and unable to make any attempt at placing the clock hands on a clock drawing task.     Learning (i.e., encoding) of novel verbal and visual information was exceptionally low. Spontaneous delayed recall (i.e., retrieval) of previously learned information was exceptionally low. Retention rates were 0% across a story learning task, 0% across a list learning task, and 17% across a shape learning task. Performance across recognition tasks was poor, suggesting very limited evidence for information consolidation.   Results of emotional screening instruments suggested that recent symptoms of generalized anxiety were in the moderate range, while symptoms of depression were within normal limits. She did not elevate any clinical subscales across a more comprehensive personality questionnaire. A screening instrument assessing recent sleep quality suggested the presence of minimal sleep dysfunction.  Tables of Scores:   Note: This summary of test scores accompanies the interpretive report and should not be considered in isolation without reference to the appropriate sections in the text. Descriptors are based on appropriate normative data and may be adjusted based on clinical judgment. Terms such as "Within Normal Limits" and "Outside Normal Limits" are used when a more specific description of the test score cannot be determined.       Percentile - Normative Descriptor > 98 - Exceptionally High 91-97 - Well Above Average 75-90 - Above Average 25-74 - Average 9-24 - Below Average 2-8 - Well Below Average < 2 - Exceptionally Low       Validity:   DESCRIPTOR       ACS Word Choice: --- --- Outside Normal Limits  Dot Counting Test: ---  --- Outside Normal Limits  Rey 15:       Free Recall --- --- Outside Normal Limits    Combined Score --- --- Outside Normal Limits  HVLT-R Recognition Discrimination Index: --- --- Outside Normal Limits  BVMT-R Retention Percentage: --- --- Outside Normal Limits       Orientation:      Raw Score Percentile   NAB Orientation, Form 1 27/29 --- ---       Cognitive Screening:      Raw Score Percentile   SLUMS: 16/30 --- ---       Intellectual Functioning:      Standard Score Percentile   Test of Premorbid Functioning: 98 45 Average       Memory:     Wechsler Memory Scale (WMS-IV):                       Raw Score (Scaled Score) Percentile     Logical Memory I 5/50 (1) <1 Exceptionally Low    Logical Memory II 0/50 (1) <1 Exceptionally Low    Logical Memory Recognition 19/30 3-9 Well Below Average       Hopkins Verbal Learning Test (HVLT-R), Form 1: Raw Score (T Score) Percentile     Total Trials 1-3 10/36 (13) <1 Exceptionally Low    Delayed Recall 0/12 (1) <1 Exceptionally Low    Recognition Discrimination Index 0 (-25) <1 Exceptionally Low      True Positives 5 --- ---      False Positives 5 --- ---        Brief Visuospatial Memory Test (BVMT-R), Form 1: Raw Score (T Score) Percentile     Total Trials 1-3 6/36 (22) <1 Exceptionally Low    Delayed Recall 1/12 (20) <1 Exceptionally Low    Recognition Discrimination Index 5 --- Below Average  Recognition Hits 6/6 --- Within Normal Limits      False Positive Errors 1 --- Well Below Average        Attention/Executive Function:     Trail Making Test (TMT): Raw Score (T Score) Percentile     Part A 48 secs.,  0 errors (40) 16 Below Average    Part B 206 secs.,  1 error (34) 5 Well Below Average        Symbol Digit Modalities Test (SDMT): Raw Score (Z-Score) Percentile     Written 21 (-1.36) 9 Below Average    Oral 27 (-1.33) 9 Below Average        Scaled Score Percentile   WAIS-IV Digit Span: 6 9 Below Average     Forward 9 37 Average    Backward 8 25 Average    Sequencing 5 5 Well Below Average       D-KEFS Color-Word Interference Test: Raw Score (Scaled Score) Percentile     Color Naming 50 secs. (3) 1 Exceptionally Low    Word Reading 37 secs. (4) 2 Well Below Average    Inhibition 126 secs. (1) <1 Exceptionally Low      Total Errors 11 errors (1) <1 Exceptionally Low    Inhibition/Switching 127 secs. (3) 1 Exceptionally Low      Total Errors 14 errors (1) <1 Exceptionally Low       Language:     Verbal Fluency Test: Raw Score (T Score) Percentile     Phonemic Fluency (FAS) 20 (32) 4 Well Below Average    Animal Fluency 14 (39) 14 Below Average        NAB Language Module, Form 1: T Score Percentile     Auditory Comprehension 56 73 Average    Naming 30/31 (57) 75 Above Average       Visuospatial/Visuoconstruction:      Raw Score Percentile   Clock Drawing: 6/10 --- Impaired       NAB Spatial Module, Form 1: T Score Percentile     Figure Drawing Copy 60 84 Above Average        Scaled Score Percentile   WAIS-IV Matrix Reasoning: 5 5 Well Below Average       Mood and Personality:      Raw Score Percentile   Beck Depression Inventory - II: 9 --- Within Normal Limits  PROMIS Anxiety Questionnaire: 23 --- Moderate       Personality Assessment Inventory: T Score  Percentile     Inconsistency 58 --- Within Normal Limits    Infrequency 51 --- Within Normal Limits    Negative Impression 51 --- Within Normal Limits    Positive Impression 54 --- Within Normal Limits    Somatic Complaints 55 --- Within Normal Limits    Anxiety 54 --- Within Normal Limits    Anxiety-Related Disorders 51 --- Within Normal Limits    Depression 51 --- Within Normal Limits    Mania 43 --- Within Normal Limits    Paranoia 44 --- Within Normal Limits    Schizophrenia 50 --- Within Normal Limits    Borderline Features 52 --- Within Normal Limits    Antisocial Features 47 --- Within Normal Limits    Alcohol  Problems 50 --- Within Normal Limits    Drug Problems 54 --- Within Normal Limits    Aggression 55 --- Within Normal Limits    Suicidal Ideation 43 --- Within Normal Limits    Stress 50 --- Within Normal Limits  Non Support 56 --- Within Normal Limits    Treatment Rejection 63 --- Within Normal Limits    Dominance 49 --- Within Normal Limits    Warmth 51 --- Within Normal Limits       Additional Questionnaires:      Raw Score Percentile   PROMIS Sleep Disturbance Questionnaire: 13 --- None to Slight   Informed Consent and Coding/Compliance:   The current evaluation represents a clinical evaluation for the purposes previously outlined by the referral source and is in no way reflective of a forensic evaluation.   Ms. Mallicoat was provided with a verbal description of the nature and purpose of the present neuropsychological evaluation. Also reviewed were the foreseeable risks and/or discomforts and benefits of the procedure, limits of confidentiality, and mandatory reporting requirements of this provider. The patient was given the opportunity to ask questions and receive answers about the evaluation. Oral consent to participate was provided by the patient.   This evaluation was conducted by Newman Nickels, Ph.D., ABPP-CN, board certified clinical neuropsychologist. Ms. Carelli completed a clinical interview with Dr. Milbert Coulter, billed as one unit [phone], and 230 minutes of cognitive testing and scoring, billed as one unit [phone] and seven additional units 96139. Psychometrist Wallace Keller, B.S., assisted Dr. Milbert Coulter with test administration and scoring procedures. As a separate and discrete service, Dr. Milbert Coulter spent a total of 160 minutes in interpretation and report writing billed as one unit [phone] and two units 96133.

## 2022-03-20 NOTE — Progress Notes (Signed)
   Psychometrician Note   Cognitive testing was administered to Mia Kline by Wallace Keller, B.S. (psychometrist) under the supervision of Dr. Newman Nickels, Ph.D., licensed psychologist on 03/20/2022. Mia Kline did not appear overtly distressed by the testing session per behavioral observation or responses across self-report questionnaires. Rest breaks were offered.    The battery of tests administered was selected by Dr. Newman Nickels, Ph.D. with consideration to Mia Kline's current level of functioning, the nature of her symptoms, emotional and behavioral responses during interview, level of literacy, observed level of motivation/effort, and the nature of the referral question. This battery was communicated to the psychometrist. Communication between Dr. Newman Nickels, Ph.D. and the psychometrist was ongoing throughout the evaluation and Dr. Newman Nickels, Ph.D. was immediately accessible at all times. Dr. Newman Nickels, Ph.D. provided supervision to the psychometrist on the date of this service to the extent necessary to assure the quality of all services provided.    Mia Kline will return within approximately 1-2 weeks for an interactive feedback session with Dr. Milbert Coulter at which time her test performances, clinical impressions, and treatment recommendations will be reviewed in detail. Mia Kline understands she can contact our office should she require our assistance before this time.  A total of 230 minutes of billable time were spent face-to-face with Mia Kline by the psychometrist. This includes both test administration and scoring time. Billing for these services is reflected in the clinical report generated by Dr. Newman Nickels, Ph.D.  This note reflects time spent with the psychometrician and does not include test scores or any clinical interpretations made by Dr. Milbert Coulter. The full report will follow in a separate note.

## 2022-03-21 ENCOUNTER — Telehealth: Payer: Self-pay | Admitting: Neurology

## 2022-03-21 DIAGNOSIS — R413 Other amnesia: Secondary | ICD-10-CM

## 2022-03-21 DIAGNOSIS — R4189 Other symptoms and signs involving cognitive functions and awareness: Secondary | ICD-10-CM

## 2022-03-21 NOTE — Telephone Encounter (Addendum)
I called the patient, recently had neuropsychological evaluation with Dr. Milbert Coulter, she performed very poorly across all validity indicators, limiting the interpretability of the evaluation.  Patient has indicated her memory issues significantly impact her life.  Discussed with Dr. Terrace Arabia, will order a PET scan.  Depending on those results may consider LP.  She is agreeable to proceed.  Orders Placed This Encounter  Procedures   NM PET Brain Amyloid

## 2022-03-21 NOTE — Telephone Encounter (Signed)
Noted, thanks!

## 2022-03-24 NOTE — Progress Notes (Signed)
Reviewed. Has appt with neurology in 04/2022

## 2022-03-25 ENCOUNTER — Other Ambulatory Visit: Payer: Self-pay | Admitting: Family Medicine

## 2022-03-27 ENCOUNTER — Encounter: Payer: 59 | Admitting: Psychology

## 2022-03-31 ENCOUNTER — Telehealth: Payer: Self-pay | Admitting: Neurology

## 2022-03-31 NOTE — Telephone Encounter (Signed)
[facility] NPR Case Number: [id number] sent to [facility]

## 2022-04-06 ENCOUNTER — Telehealth: Payer: Self-pay | Admitting: Neurology

## 2022-04-06 NOTE — Telephone Encounter (Signed)
I talked with Martie Lee, patients daughter. I explained the purpose of ordered the amyloid PET scan. She is going to discussing with her mother and they will let us know if they will schedule. She is retiring the end of the month, at some point may relocate to [facility].

## 2022-05-09 ENCOUNTER — Ambulatory Visit: Payer: 59 | Admitting: Neurology

## 2022-08-03 ENCOUNTER — Telehealth: Payer: Self-pay | Admitting: Orthopedic Surgery

## 2022-08-03 NOTE — Telephone Encounter (Signed)
Note created about pre med for dental being recommended by Dr Marlou Sa for lifetime. Also LMVM for patient advising that Dr Marlou Sa DOES recommend this. Letter faxed to Dr Liana Gerold office

## 2022-08-03 NOTE — Telephone Encounter (Signed)
Patient walked in during lunch requesting a letter be sent to her dentist  Massie Maroon DDS (p) [phone]  [phone] That states she no longer need pre meds for dental cleaning.  She stated that her surgery was in 2018.    Please advise

## 2022-10-14 DIAGNOSIS — R5381 Other malaise: Secondary | ICD-10-CM | POA: Diagnosis not present

## 2022-10-14 DIAGNOSIS — R509 Fever, unspecified: Secondary | ICD-10-CM | POA: Diagnosis not present

## 2022-10-14 DIAGNOSIS — J029 Acute pharyngitis, unspecified: Secondary | ICD-10-CM | POA: Diagnosis not present

## 2022-10-14 DIAGNOSIS — R0981 Nasal congestion: Secondary | ICD-10-CM | POA: Diagnosis not present

## 2022-11-24 DIAGNOSIS — R519 Headache, unspecified: Secondary | ICD-10-CM | POA: Diagnosis not present

## 2022-11-24 DIAGNOSIS — E7800 Pure hypercholesterolemia, unspecified: Secondary | ICD-10-CM | POA: Diagnosis not present

## 2022-11-24 DIAGNOSIS — R413 Other amnesia: Secondary | ICD-10-CM | POA: Diagnosis not present

## 2022-11-24 DIAGNOSIS — I10 Essential (primary) hypertension: Secondary | ICD-10-CM | POA: Diagnosis not present

## 2023-02-06 DIAGNOSIS — R519 Headache, unspecified: Secondary | ICD-10-CM | POA: Diagnosis not present

## 2023-02-06 DIAGNOSIS — Z131 Encounter for screening for diabetes mellitus: Secondary | ICD-10-CM | POA: Diagnosis not present

## 2023-02-06 DIAGNOSIS — R413 Other amnesia: Secondary | ICD-10-CM | POA: Diagnosis not present

## 2023-02-06 DIAGNOSIS — R682 Dry mouth, unspecified: Secondary | ICD-10-CM | POA: Diagnosis not present

## 2023-03-28 DIAGNOSIS — R413 Other amnesia: Secondary | ICD-10-CM | POA: Diagnosis not present

## 2023-06-12 DIAGNOSIS — E7800 Pure hypercholesterolemia, unspecified: Secondary | ICD-10-CM | POA: Diagnosis not present

## 2023-06-12 DIAGNOSIS — I10 Essential (primary) hypertension: Secondary | ICD-10-CM | POA: Diagnosis not present

## 2023-06-12 DIAGNOSIS — R519 Headache, unspecified: Secondary | ICD-10-CM | POA: Diagnosis not present

## 2023-06-12 DIAGNOSIS — R42 Dizziness and giddiness: Secondary | ICD-10-CM | POA: Diagnosis not present

## 2023-06-12 DIAGNOSIS — Z23 Encounter for immunization: Secondary | ICD-10-CM | POA: Diagnosis not present
